# Patient Record
Sex: Female | Born: 1985 | Race: White | Hispanic: No | Marital: Single | Smoking: Current every day smoker
Health system: Southern US, Community
[De-identification: ages and names within clinical notes are randomized; demographics above are authoritative.]

## PROBLEM LIST (undated history)

## (undated) ENCOUNTER — Inpatient Hospital Stay (HOSPITAL_COMMUNITY): Payer: Self-pay

## (undated) DIAGNOSIS — F32A Depression, unspecified: Secondary | ICD-10-CM

## (undated) DIAGNOSIS — K859 Acute pancreatitis without necrosis or infection, unspecified: Secondary | ICD-10-CM

## (undated) DIAGNOSIS — O139 Gestational [pregnancy-induced] hypertension without significant proteinuria, unspecified trimester: Secondary | ICD-10-CM

## (undated) DIAGNOSIS — F419 Anxiety disorder, unspecified: Secondary | ICD-10-CM

## (undated) DIAGNOSIS — R51 Headache: Secondary | ICD-10-CM

## (undated) DIAGNOSIS — R87619 Unspecified abnormal cytological findings in specimens from cervix uteri: Secondary | ICD-10-CM

## (undated) DIAGNOSIS — O30003 Twin pregnancy, unspecified number of placenta and unspecified number of amniotic sacs, third trimester: Secondary | ICD-10-CM

## (undated) DIAGNOSIS — IMO0002 Reserved for concepts with insufficient information to code with codable children: Secondary | ICD-10-CM

## (undated) DIAGNOSIS — K851 Biliary acute pancreatitis without necrosis or infection: Secondary | ICD-10-CM

## (undated) DIAGNOSIS — F329 Major depressive disorder, single episode, unspecified: Secondary | ICD-10-CM

## (undated) DIAGNOSIS — N390 Urinary tract infection, site not specified: Secondary | ICD-10-CM

## (undated) DIAGNOSIS — D649 Anemia, unspecified: Secondary | ICD-10-CM

## (undated) DIAGNOSIS — A630 Anogenital (venereal) warts: Secondary | ICD-10-CM

## (undated) DIAGNOSIS — A749 Chlamydial infection, unspecified: Secondary | ICD-10-CM

## (undated) DIAGNOSIS — O0930 Supervision of pregnancy with insufficient antenatal care, unspecified trimester: Secondary | ICD-10-CM

## (undated) DIAGNOSIS — R87629 Unspecified abnormal cytological findings in specimens from vagina: Secondary | ICD-10-CM

## (undated) HISTORY — DX: Unspecified abnormal cytological findings in specimens from cervix uteri: R87.619

## (undated) HISTORY — DX: Chlamydial infection, unspecified: A74.9

## (undated) HISTORY — DX: Anogenital (venereal) warts: A63.0

## (undated) HISTORY — DX: Reserved for concepts with insufficient information to code with codable children: IMO0002

## (undated) HISTORY — DX: Urinary tract infection, site not specified: N39.0

---
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
## 2007-01-13 ENCOUNTER — Emergency Department (HOSPITAL_COMMUNITY): Admission: EM | Admit: 2007-01-13 | Discharge: 2007-01-13 | Payer: Self-pay | Admitting: Emergency Medicine

## 2007-04-20 ENCOUNTER — Inpatient Hospital Stay (HOSPITAL_COMMUNITY): Admission: AD | Admit: 2007-04-20 | Discharge: 2007-04-21 | Payer: Self-pay | Admitting: Obstetrics and Gynecology

## 2007-05-07 ENCOUNTER — Inpatient Hospital Stay (HOSPITAL_COMMUNITY): Admission: AD | Admit: 2007-05-07 | Discharge: 2007-05-07 | Payer: Self-pay | Admitting: Obstetrics and Gynecology

## 2007-05-26 ENCOUNTER — Inpatient Hospital Stay (HOSPITAL_COMMUNITY): Admission: AD | Admit: 2007-05-26 | Discharge: 2007-05-27 | Payer: Self-pay | Admitting: Obstetrics and Gynecology

## 2007-06-10 ENCOUNTER — Inpatient Hospital Stay (HOSPITAL_COMMUNITY): Admission: AD | Admit: 2007-06-10 | Discharge: 2007-06-12 | Payer: Self-pay | Admitting: Obstetrics and Gynecology

## 2007-10-20 ENCOUNTER — Inpatient Hospital Stay (HOSPITAL_COMMUNITY): Admission: AD | Admit: 2007-10-20 | Discharge: 2007-10-20 | Payer: Self-pay | Admitting: Obstetrics and Gynecology

## 2007-12-26 ENCOUNTER — Inpatient Hospital Stay (HOSPITAL_COMMUNITY): Admission: AD | Admit: 2007-12-26 | Discharge: 2007-12-27 | Payer: Self-pay | Admitting: Obstetrics & Gynecology

## 2009-08-01 ENCOUNTER — Emergency Department (HOSPITAL_COMMUNITY): Admission: EM | Admit: 2009-08-01 | Discharge: 2009-08-01 | Payer: Self-pay | Admitting: Emergency Medicine

## 2010-08-16 NOTE — L&D Delivery Note (Signed)
Delivery Note Pt progressed to complete and pushed very well.  At 10:09 PM a viable female was delivered via Vaginal, Spontaneous Delivery (Presentation: Left Occiput Anterior).  APGAR: 9,9 ; weight 7 lbs 12 oz .   Placenta status: spontaneous, intact, some trailing membranes removed manually .  Cord:  with the following complications: none.  Anesthesia: Epidural  Episiotomy: None Lacerations: Abrasions Suture Repair: none Est. Blood Loss (mL): 300  Mom to postpartum.  Baby to nursery-stable.  Wallis Vancott,Simao D 07/23/2011, 10:27 PM

## 2010-11-16 LAB — WET PREP, GENITAL
Clue Cells Wet Prep HPF POC: NONE SEEN
Trich, Wet Prep: NONE SEEN
Yeast Wet Prep HPF POC: NONE SEEN

## 2010-11-16 LAB — URINALYSIS, ROUTINE W REFLEX MICROSCOPIC
Bilirubin Urine: NEGATIVE
Glucose, UA: NEGATIVE mg/dL
Ketones, ur: NEGATIVE mg/dL
Nitrite: POSITIVE — AB
Protein, ur: NEGATIVE mg/dL
Specific Gravity, Urine: 1.028 (ref 1.005–1.030)
Urobilinogen, UA: 1 mg/dL (ref 0.0–1.0)
pH: 6.5 (ref 5.0–8.0)

## 2010-11-16 LAB — GC/CHLAMYDIA PROBE AMP, GENITAL
Chlamydia, DNA Probe: NEGATIVE
GC Probe Amp, Genital: NEGATIVE

## 2010-11-16 LAB — URINE MICROSCOPIC-ADD ON

## 2010-11-16 LAB — PREGNANCY, URINE: Preg Test, Ur: NEGATIVE

## 2010-11-29 ENCOUNTER — Emergency Department (HOSPITAL_COMMUNITY)
Admission: EM | Admit: 2010-11-29 | Discharge: 2010-11-29 | Disposition: A | Discharge status: Home / Self Care | Payer: Medicaid Other | Attending: Emergency Medicine | Admitting: Emergency Medicine

## 2010-11-29 ENCOUNTER — Emergency Department (HOSPITAL_COMMUNITY): Payer: Medicaid Other

## 2010-11-29 DIAGNOSIS — N390 Urinary tract infection, site not specified: Secondary | ICD-10-CM | POA: Insufficient documentation

## 2010-11-29 DIAGNOSIS — N949 Unspecified condition associated with female genital organs and menstrual cycle: Secondary | ICD-10-CM | POA: Insufficient documentation

## 2010-11-29 DIAGNOSIS — O2390 Unspecified genitourinary tract infection in pregnancy, unspecified trimester: Secondary | ICD-10-CM | POA: Insufficient documentation

## 2010-11-29 DIAGNOSIS — N898 Other specified noninflammatory disorders of vagina: Secondary | ICD-10-CM | POA: Insufficient documentation

## 2010-11-29 DIAGNOSIS — R1032 Left lower quadrant pain: Secondary | ICD-10-CM | POA: Insufficient documentation

## 2010-11-29 LAB — CBC
HCT: 36.7 % (ref 36.0–46.0)
Hemoglobin: 13.2 g/dL (ref 12.0–15.0)
MCH: 30.3 pg (ref 26.0–34.0)
MCHC: 36 g/dL (ref 30.0–36.0)
MCV: 84.4 fL (ref 78.0–100.0)
Platelets: 267 10*3/uL (ref 150–400)
RBC: 4.35 MIL/uL (ref 3.87–5.11)
RDW: 12.3 % (ref 11.5–15.5)
WBC: 10.6 10*3/uL — ABNORMAL HIGH (ref 4.0–10.5)

## 2010-11-29 LAB — DIFFERENTIAL
Basophils Absolute: 0.1 10*3/uL (ref 0.0–0.1)
Basophils Relative: 1 % (ref 0–1)
Eosinophils Absolute: 0.5 10*3/uL (ref 0.0–0.7)
Eosinophils Relative: 4 % (ref 0–5)
Lymphocytes Relative: 26 % (ref 12–46)
Lymphs Abs: 2.8 10*3/uL (ref 0.7–4.0)
Monocytes Absolute: 1 10*3/uL (ref 0.1–1.0)
Monocytes Relative: 9 % (ref 3–12)
Neutro Abs: 6.4 10*3/uL (ref 1.7–7.7)
Neutrophils Relative %: 60 % (ref 43–77)

## 2010-11-29 LAB — POCT I-STAT, CHEM 8
BUN: 6 mg/dL (ref 6–23)
Calcium, Ion: 1.09 mmol/L — ABNORMAL LOW (ref 1.12–1.32)
Chloride: 103 mEq/L (ref 96–112)
Creatinine, Ser: 0.9 mg/dL (ref 0.4–1.2)
Glucose, Bld: 88 mg/dL (ref 70–99)
HCT: 39 % (ref 36.0–46.0)
Hemoglobin: 13.3 g/dL (ref 12.0–15.0)
Potassium: 3.3 mEq/L — ABNORMAL LOW (ref 3.5–5.1)
Sodium: 136 mEq/L (ref 135–145)
TCO2: 21 mmol/L (ref 0–100)

## 2010-11-29 LAB — ABO/RH: ABO/RH(D): O POS

## 2010-11-29 LAB — URINALYSIS, ROUTINE W REFLEX MICROSCOPIC
Glucose, UA: NEGATIVE mg/dL
Hgb urine dipstick: NEGATIVE
Ketones, ur: >80 mg/dL — AB
Nitrite: NEGATIVE
Protein, ur: NEGATIVE mg/dL
Specific Gravity, Urine: 1.025 (ref 1.005–1.030)
Urobilinogen, UA: 1 mg/dL (ref 0.0–1.0)
pH: 5 (ref 5.0–8.0)

## 2010-11-29 LAB — URINE MICROSCOPIC-ADD ON

## 2010-11-29 LAB — POCT PREGNANCY, URINE: Preg Test, Ur: POSITIVE

## 2010-11-29 LAB — WET PREP, GENITAL
Clue Cells Wet Prep HPF POC: NONE SEEN
Trich, Wet Prep: NONE SEEN
Yeast Wet Prep HPF POC: NONE SEEN

## 2010-11-29 LAB — HCG, QUANTITATIVE, PREGNANCY: hCG, Beta Chain, Quant, S: 10006 m[IU]/mL — ABNORMAL HIGH (ref <5)

## 2010-12-01 LAB — URINE CULTURE: Culture  Setup Time: 201204151201
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
## 2011-03-23 LAB — ANTIBODY SCREEN: Antibody Screen: NEGATIVE

## 2011-03-23 LAB — RUBELLA ANTIBODY, IGM: Rubella: IMMUNE

## 2011-05-10 LAB — GC/CHLAMYDIA PROBE AMP, GENITAL
Chlamydia, DNA Probe: NEGATIVE
GC Probe Amp, Genital: NEGATIVE

## 2011-05-26 LAB — CBC
Hemoglobin: 10.6 — ABNORMAL LOW
Platelets: 310
RBC: 2.87 — ABNORMAL LOW
RDW: 13.3
WBC: 14.8 — ABNORMAL HIGH

## 2011-05-26 LAB — RPR: RPR Ser Ql: NONREACTIVE

## 2011-05-27 LAB — URINALYSIS, ROUTINE W REFLEX MICROSCOPIC
Glucose, UA: NEGATIVE
Specific Gravity, Urine: 1.02
pH: 7

## 2011-05-27 LAB — URINE MICROSCOPIC-ADD ON

## 2011-05-28 LAB — URINE MICROSCOPIC-ADD ON

## 2011-05-28 LAB — URINALYSIS, ROUTINE W REFLEX MICROSCOPIC
Glucose, UA: NEGATIVE
Hgb urine dipstick: NEGATIVE
Specific Gravity, Urine: <1.005 — ABNORMAL LOW
Urobilinogen, UA: 0.2

## 2011-05-28 LAB — FETAL FIBRONECTIN: Fetal Fibronectin: NEGATIVE

## 2011-06-13 ENCOUNTER — Encounter (HOSPITAL_COMMUNITY): Payer: Self-pay | Admitting: *Deleted

## 2011-06-13 ENCOUNTER — Inpatient Hospital Stay (HOSPITAL_COMMUNITY)
Admission: AD | Admit: 2011-06-13 | Discharge: 2011-06-13 | Disposition: A | Discharge status: Home / Self Care | Payer: Medicaid Other | Source: Ambulatory Visit | Attending: Obstetrics and Gynecology | Admitting: Obstetrics and Gynecology

## 2011-06-13 DIAGNOSIS — F329 Major depressive disorder, single episode, unspecified: Secondary | ICD-10-CM | POA: Diagnosis not present

## 2011-06-13 DIAGNOSIS — O479 False labor, unspecified: Secondary | ICD-10-CM

## 2011-06-13 DIAGNOSIS — J45909 Unspecified asthma, uncomplicated: Secondary | ICD-10-CM | POA: Diagnosis present

## 2011-06-13 DIAGNOSIS — F32A Depression, unspecified: Secondary | ICD-10-CM | POA: Diagnosis not present

## 2011-06-13 DIAGNOSIS — O4700 False labor before 37 completed weeks of gestation, unspecified trimester: Secondary | ICD-10-CM | POA: Insufficient documentation

## 2011-06-13 DIAGNOSIS — O09899 Supervision of other high risk pregnancies, unspecified trimester: Secondary | ICD-10-CM

## 2011-06-13 DIAGNOSIS — O0930 Supervision of pregnancy with insufficient antenatal care, unspecified trimester: Secondary | ICD-10-CM

## 2011-06-13 HISTORY — DX: Headache: R51

## 2011-06-13 HISTORY — DX: Major depressive disorder, single episode, unspecified: F32.9

## 2011-06-13 HISTORY — DX: Depression, unspecified: F32.A

## 2011-06-13 LAB — URINALYSIS, ROUTINE W REFLEX MICROSCOPIC
Bilirubin Urine: NEGATIVE
Glucose, UA: NEGATIVE mg/dL
Ketones, ur: NEGATIVE mg/dL
Protein, ur: NEGATIVE mg/dL

## 2011-06-13 LAB — URINE MICROSCOPIC-ADD ON

## 2011-06-13 LAB — WET PREP, GENITAL

## 2011-06-13 MED ORDER — TERBUTALINE SULFATE 1 MG/ML IJ SOLN
0.2500 mg | Freq: Once | INTRAMUSCULAR | Status: AC
  Administered 2011-06-13: 0.25 mg via SUBCUTANEOUS

## 2011-06-13 MED ORDER — TERBUTALINE SULFATE 1 MG/ML IJ SOLN
INTRAMUSCULAR | Status: AC
  Administered 2011-06-13: 0.25 mg via SUBCUTANEOUS
  Filled 2011-06-13: qty 1

## 2011-06-13 NOTE — Progress Notes (Signed)
Pt reports having ctx since 1pm. Reports ctx about 3-4 min now. Reports decreased fetal movement having increased mucusy discharge but denies vag bleeding.

## 2011-06-13 NOTE — ED Provider Notes (Signed)
[name] is a 25 y.o. year old G10P1011 female at [redacted]w[redacted]d weeks gestation who presents to MAU reporting preterm UC's since 85. Last IC at 1300. She reports FM and denies VB or LOF.  History OB History    Grav Para Term Preterm Abortions TAB SAB Ect Mult Living   3 1 1  0 1 0 1 0 0 1     Past Medical History  Diagnosis Date  . Asthma   . Headache     migraines  . Depression    Past Surgical History  Procedure Date  . No past surgeries    Family History: family history is not on file. Social History:  reports that she has been smoking Cigarettes.  She has been smoking about .5 packs per day. She does not have any smokeless tobacco history on file. She reports that she does not drink alcohol or use illicit drugs.  ROS: Decreased appetite today, otherwise neg   Dilation: Fingertip Station: -3 Exam by:: VKatrinka Blazing CNM  Blood pressure 119/62, pulse 96, temperature 98.6 F (37 C), temperature source Oral, resp. rate 18, height 5\' 8"  (1.727 m), weight 89.812 kg (198 lb). Maternal Exam:  Uterine Assessment: Contraction strength is mild.  Contraction duration is 50 seconds. Contraction frequency is regular.   Abdomen: Fundal height is S=D.   Fetal presentation: vertex  Introitus: Normal vulva. Vagina is positive for vaginal discharge (mucus).  Pelvis: adequate for delivery.   Cervix: Cervix evaluated by digital exam.     Fetal Exam Fetal Monitor Review: Mode: ultrasound.   Baseline rate: 140.  Variability: moderate (6-25 bpm).   Pattern: accelerations present and no decelerations.    Fetal State Assessment: Category I - tracings are normal.     Physical Exam  Constitutional: She is oriented to person, place, and time. She appears well-developed and well-nourished. She appears distressed (mild).  Cardiovascular: Normal rate.   Respiratory: Effort normal.  GI: Soft. There is no tenderness.  Genitourinary: Vaginal discharge (mucus) found.  Neurological: She is alert and  oriented to person, place, and time.  Skin: Skin is warm and dry.  Psychiatric: She has a normal mood and affect.   Dilation: Fingertip Cervical Position: Posterior Station: -3 Exam by:: Ivonne Andrew CNM   Prenatal labs: ABO, Rh: --/--/O POS (04/15 0300) Antibody:   Rubella:   RPR:    HBsAg:    HIV:    GBS:     Assessment/Plan: 1. Preterm UC's w/out cervical change, improved w/ Terbutaline 2. FHR category I  Plan: 1. D/C home per consult w/ Dr. Ambrose Mantle 2. F/U as scheduled w/ Dr. Ambrose Mantle 3. PTL precautions and FKCs 4. Pelvic rest x 1 week  [name] 06/13/2011, 7:05 PM

## 2011-07-23 ENCOUNTER — Encounter (HOSPITAL_COMMUNITY): Payer: Self-pay | Admitting: Anesthesiology

## 2011-07-23 ENCOUNTER — Encounter (HOSPITAL_COMMUNITY): Payer: Self-pay

## 2011-07-23 ENCOUNTER — Inpatient Hospital Stay (HOSPITAL_COMMUNITY): Payer: Medicaid Other | Admitting: Anesthesiology

## 2011-07-23 ENCOUNTER — Inpatient Hospital Stay (HOSPITAL_COMMUNITY)
Admission: AD | Admit: 2011-07-23 | Discharge: 2011-07-25 | DRG: 775 | Disposition: A | Discharge status: Home / Self Care | Payer: Medicaid Other | Source: Ambulatory Visit | Attending: Obstetrics and Gynecology | Admitting: Obstetrics and Gynecology

## 2011-07-23 ENCOUNTER — Encounter (HOSPITAL_COMMUNITY): Payer: Self-pay | Admitting: *Deleted

## 2011-07-23 DIAGNOSIS — O4700 False labor before 37 completed weeks of gestation, unspecified trimester: Secondary | ICD-10-CM

## 2011-07-23 DIAGNOSIS — O09899 Supervision of other high risk pregnancies, unspecified trimester: Secondary | ICD-10-CM

## 2011-07-23 DIAGNOSIS — O0930 Supervision of pregnancy with insufficient antenatal care, unspecified trimester: Secondary | ICD-10-CM

## 2011-07-23 DIAGNOSIS — O4290 Premature rupture of membranes, unspecified as to length of time between rupture and onset of labor, unspecified weeks of gestation: Principal | ICD-10-CM | POA: Diagnosis present

## 2011-07-23 HISTORY — DX: Gestational (pregnancy-induced) hypertension without significant proteinuria, unspecified trimester: O13.9

## 2011-07-23 HISTORY — DX: Anemia, unspecified: D64.9

## 2011-07-23 HISTORY — DX: Supervision of pregnancy with insufficient antenatal care, unspecified trimester: O09.30

## 2011-07-23 LAB — CBC
HCT: 31.7 % — ABNORMAL LOW (ref 36.0–46.0)
Hemoglobin: 10.5 g/dL — ABNORMAL LOW (ref 12.0–15.0)
MCHC: 33.1 g/dL (ref 30.0–36.0)
RBC: 3.68 MIL/uL — ABNORMAL LOW (ref 3.87–5.11)

## 2011-07-23 LAB — POCT FERN TEST: Fern Test: POSITIVE

## 2011-07-23 MED ORDER — LACTATED RINGERS IV SOLN
500.0000 mL | Freq: Once | INTRAVENOUS | Status: AC
  Administered 2011-07-23: 500 mL via INTRAVENOUS

## 2011-07-23 MED ORDER — PHENYLEPHRINE 40 MCG/ML (10ML) SYRINGE FOR IV PUSH (FOR BLOOD PRESSURE SUPPORT): 80.0000 ug | PREFILLED_SYRINGE | INTRAVENOUS | Status: DC | PRN

## 2011-07-23 MED ORDER — EPHEDRINE 5 MG/ML INJ: 10.0000 mg | INTRAVENOUS | Status: DC | PRN

## 2011-07-23 MED ORDER — FENTANYL CITRATE 0.05 MG/ML IJ SOLN
INTRAMUSCULAR | Status: AC
  Filled 2011-07-23: qty 2

## 2011-07-23 MED ORDER — OXYTOCIN 20 UNITS IN LACTATED RINGERS INFUSION - SIMPLE
125.0000 mL/h | Freq: Once | INTRAVENOUS | Status: AC
  Administered 2011-07-23: 125 mL/h via INTRAVENOUS

## 2011-07-23 MED ORDER — TERBUTALINE SULFATE 1 MG/ML IJ SOLN: 0.2500 mg | Freq: Once | INTRAMUSCULAR | Status: DC | PRN

## 2011-07-23 MED ORDER — FENTANYL CITRATE 0.05 MG/ML IJ SOLN
INTRAMUSCULAR | Status: DC | PRN
  Administered 2011-07-23: 100 ug via EPIDURAL

## 2011-07-23 MED ORDER — FENTANYL CITRATE 0.05 MG/ML IJ SOLN: 100.0000 ug | Freq: Once | INTRAMUSCULAR | Status: DC

## 2011-07-23 MED ORDER — EPHEDRINE 5 MG/ML INJ
10.0000 mg | INTRAVENOUS | Status: DC | PRN
  Filled 2011-07-23: qty 4

## 2011-07-23 MED ORDER — ACETAMINOPHEN 325 MG PO TABS: 650.0000 mg | ORAL_TABLET | ORAL | Status: DC | PRN

## 2011-07-23 MED ORDER — BUPIVACAINE HCL (PF) 0.25 % IJ SOLN
INTRAMUSCULAR | Status: DC | PRN
  Administered 2011-07-23 (×2): 5 mL via EPIDURAL

## 2011-07-23 MED ORDER — FENTANYL 2.5 MCG/ML BUPIVACAINE 1/10 % EPIDURAL INFUSION (WH - ANES)
14.0000 mL/h | INTRAMUSCULAR | Status: DC
  Administered 2011-07-23 (×2): 14 mL/h via EPIDURAL
  Filled 2011-07-23 (×3): qty 60

## 2011-07-23 MED ORDER — LACTATED RINGERS IV SOLN: 500.0000 mL | INTRAVENOUS | Status: DC | PRN

## 2011-07-23 MED ORDER — PHENYLEPHRINE 40 MCG/ML (10ML) SYRINGE FOR IV PUSH (FOR BLOOD PRESSURE SUPPORT)
80.0000 ug | PREFILLED_SYRINGE | INTRAVENOUS | Status: DC | PRN
  Filled 2011-07-23: qty 5

## 2011-07-23 MED ORDER — DIPHENHYDRAMINE HCL 50 MG/ML IJ SOLN: 12.5000 mg | INTRAMUSCULAR | Status: DC | PRN

## 2011-07-23 MED ORDER — LIDOCAINE HCL (PF) 1 % IJ SOLN
30.0000 mL | INTRAMUSCULAR | Status: DC | PRN
  Filled 2011-07-23: qty 30

## 2011-07-23 MED ORDER — ONDANSETRON HCL 4 MG/2ML IJ SOLN: 4.0000 mg | Freq: Four times a day (QID) | INTRAMUSCULAR | Status: DC | PRN

## 2011-07-23 MED ORDER — LACTATED RINGERS IV SOLN
INTRAVENOUS | Status: DC
  Administered 2011-07-23 (×2): 125 mL/h via INTRAVENOUS

## 2011-07-23 MED ORDER — OXYTOCIN 20 UNITS IN LACTATED RINGERS INFUSION - SIMPLE
1.0000 m[IU]/min | INTRAVENOUS | Status: DC
  Administered 2011-07-23: 1 m[IU]/min via INTRAVENOUS
  Filled 2011-07-23: qty 1000

## 2011-07-23 MED ORDER — IBUPROFEN 600 MG PO TABS: 600.0000 mg | ORAL_TABLET | Freq: Four times a day (QID) | ORAL | Status: DC | PRN

## 2011-07-23 MED ORDER — CITRIC ACID-SODIUM CITRATE 334-500 MG/5ML PO SOLN: 30.0000 mL | ORAL | Status: DC | PRN

## 2011-07-23 MED ORDER — OXYTOCIN BOLUS FROM INFUSION
500.0000 mL | Freq: Once | INTRAVENOUS | Status: DC
  Filled 2011-07-23: qty 500

## 2011-07-23 MED ORDER — FENTANYL 2.5 MCG/ML BUPIVACAINE 1/10 % EPIDURAL INFUSION (WH - ANES)
INTRAMUSCULAR | Status: DC | PRN
  Administered 2011-07-23: 14 mL/h via EPIDURAL

## 2011-07-23 MED ORDER — LIDOCAINE HCL 1.5 % IJ SOLN
INTRAMUSCULAR | Status: DC | PRN
  Administered 2011-07-23 (×2): 5 mL via EPIDURAL

## 2011-07-23 MED ORDER — OXYCODONE-ACETAMINOPHEN 5-325 MG PO TABS
1.0000 | ORAL_TABLET | ORAL | Status: DC | PRN
  Administered 2011-07-23: 1 via ORAL
  Filled 2011-07-23: qty 1

## 2011-07-23 NOTE — Anesthesia Preprocedure Evaluation (Signed)
Anesthesia Evaluation  Patient identified by MRN, date of birth, ID band Patient awake    Reviewed: Allergy & Precautions, H&P , NPO status , Patient's Chart, lab work & pertinent test results  Airway Mallampati: II TM Distance: >3 FB Neck ROM: full    Dental No notable dental hx.    Pulmonary    Pulmonary exam normal       Cardiovascular neg cardio ROS     Neuro/Psych PSYCHIATRIC DISORDERS Depression    GI/Hepatic negative GI ROS, Neg liver ROS,   Endo/Other  Negative Endocrine ROS  Renal/GU negative Renal ROS  Genitourinary negative   Musculoskeletal negative musculoskeletal ROS (+)   Abdominal Normal abdominal exam  (+)   Peds negative pediatric ROS (+)  Hematology negative hematology ROS (+)   Anesthesia Other Findings   Reproductive/Obstetrics (+) Pregnancy                           Anesthesia Physical Anesthesia Plan  ASA: II  Anesthesia Plan: Epidural   Post-op Pain Management:    Induction:   Airway Management Planned:   Additional Equipment:   Intra-op Plan:   Post-operative Plan:   Informed Consent: I have reviewed the patients History and Physical, chart, labs and discussed the procedure including the risks, benefits and alternatives for the proposed anesthesia with the patient or authorized representative who has indicated his/her understanding and acceptance.     Plan Discussed with:   Anesthesia Plan Comments:         Anesthesia Quick Evaluation

## 2011-07-23 NOTE — Progress Notes (Signed)
Patient states she started leaking clear fluid between 1100-1130 and continues to leak. Having contractions every 5 minutes, was 3 cm yesterday. Reports good fetal movement.

## 2011-07-23 NOTE — Anesthesia Procedure Notes (Signed)
Epidural Patient location during procedure: OB Start time: 07/23/2011 4:15 PM End time: 07/23/2011 4:20 PM Reason for block: procedure for pain  Staffing Anesthesiologist: Sandrea Hughs Performed by: anesthesiologist   Preanesthetic Checklist Completed: patient identified, site marked, surgical consent, pre-op evaluation, timeout performed, IV checked, risks and benefits discussed and monitors and equipment checked  Epidural Patient position: sitting Prep: site prepped and draped and DuraPrep Patient monitoring: continuous pulse ox and blood pressure Approach: midline Injection technique: LOR air  Needle:  Needle type: Tuohy  Needle gauge: 17 G Needle length: 9 cm Needle insertion depth: 5 cm cm Catheter type: closed end flexible Catheter size: 19 Gauge Catheter at skin depth: 10 cm Test dose: negative and 1.5% lidocaine  Assessment Sensory level: T8 Events: blood not aspirated, injection not painful, no injection resistance, negative IV test and no paresthesia

## 2011-07-23 NOTE — H&P (Signed)
[name] is a 25 y.o. female G3 P1011, EGA 39+ weekspresenting for evaluation of leaking fluid since mid-morning.  Evaluation in MAU confirmed ROM, VE 3 cm.  Pt admitted and started on pitocin augmentation, has received epidural.  Prenatal care essentially uncomplicated, see prenatal records for complete history.  Maternal Medical History:  Reason for admission: Reason for admission: rupture of membranes.  Fetal activity: Perceived fetal activity is normal.      OB History    Grav Para Term Preterm Abortions TAB SAB Ect Mult Living   3 1 1  0 1 0 1 0 0 1    SVD at 41 weeks, 8 lbs 6 oz SAB  Past Medical History  Diagnosis Date  . Asthma   . Headache     migraines  . Depression    Past Surgical History  Procedure Date  . No past surgeries    Family History: family history is not on file. Social History:  reports that she has been smoking Cigarettes.  She has been smoking about .5 packs per day. She does not have any smokeless tobacco history on file. She reports that she does not drink alcohol or use illicit drugs.  Review of Systems  Respiratory: Negative.   Cardiovascular: Negative.    4/50/-1, vtx, IUPC inserted Dilation: 5 Effacement (%): 60 Station: -2 Exam by:: Valentina Lucks, RN Blood pressure 101/77, pulse 66, temperature 98.1 F (36.7 C), temperature source Oral, resp. rate 18, height 5\' 6"  (1.676 m), weight 91.173 kg (201 lb), SpO2 99.00%. Maternal Exam:  Uterine Assessment: Contraction strength is firm.  Contraction frequency is regular.   Abdomen: Patient reports no abdominal tenderness. Estimated fetal weight is 8 lbs.   Fetal presentation: vertex  Introitus: Normal vulva. Normal vagina.  Ferning test: positive.  Amniotic fluid character: clear.  Pelvis: adequate for delivery.   Cervix: Cervix evaluated by digital exam.     Fetal Exam Fetal Monitor Review: Mode: ultrasound.   Baseline rate: 140.  Variability: moderate (6-25 bpm).   Pattern:  accelerations present and variable decelerations.    Fetal State Assessment: Category II - tracings are indeterminate.     Physical Exam  Constitutional: She appears well-developed and well-nourished.  Neck: Neck supple. No thyromegaly present.  Cardiovascular: Normal rate, regular rhythm and normal heart sounds.   No murmur heard. Respiratory: Effort normal and breath sounds normal. No respiratory distress.  GI: Soft.       Gravid     Prenatal labs: ABO, Rh: --/--/O POS (04/15 0300) Antibody: Negative (08/07 0000) Rubella: Immune (08/07 0000) RPR: Nonreactive (08/07 0000)  HBsAg: Negative (08/07 0000)  HIV: Non-reactive (08/07 0000)  GBS: Negative (11/06 0000)   Assessment/Plan: IUP at 39+ weeks with PROM in latent labor, on pitocin for augmentation.  Will monitor progress.   Meranda Dechaine,Sindelar D 07/23/2011, 6:07 PM

## 2011-07-24 MED ORDER — IBUPROFEN 600 MG PO TABS
600.0000 mg | ORAL_TABLET | Freq: Four times a day (QID) | ORAL | Status: DC
  Administered 2011-07-25: 600 mg via ORAL

## 2011-07-24 MED ORDER — DIPHENHYDRAMINE HCL 25 MG PO CAPS: 25.0000 mg | ORAL_CAPSULE | Freq: Four times a day (QID) | ORAL | Status: DC | PRN

## 2011-07-24 MED ORDER — WITCH HAZEL-GLYCERIN EX PADS: 1.0000 "application " | MEDICATED_PAD | CUTANEOUS | Status: DC | PRN

## 2011-07-24 MED ORDER — PRENATAL PLUS 27-1 MG PO TABS
1.0000 | ORAL_TABLET | Freq: Every day | ORAL | Status: DC
  Administered 2011-07-24 – 2011-07-25 (×2): 1 via ORAL
  Filled 2011-07-24 (×2): qty 1

## 2011-07-24 MED ORDER — DIBUCAINE 1 % RE OINT: 1.0000 "application " | TOPICAL_OINTMENT | RECTAL | Status: DC | PRN

## 2011-07-24 MED ORDER — ALBUTEROL SULFATE HFA 108 (90 BASE) MCG/ACT IN AERS
2.0000 | INHALATION_SPRAY | Freq: Four times a day (QID) | RESPIRATORY_TRACT | Status: DC | PRN
  Filled 2011-07-24: qty 6.7

## 2011-07-24 MED ORDER — MEASLES, MUMPS & RUBELLA VAC ~~LOC~~ INJ
0.5000 mL | INJECTION | Freq: Once | SUBCUTANEOUS | Status: DC
  Filled 2011-07-24: qty 0.5

## 2011-07-24 MED ORDER — TETANUS-DIPHTH-ACELL PERTUSSIS 5-2.5-18.5 LF-MCG/0.5 IM SUSP
0.5000 mL | Freq: Once | INTRAMUSCULAR | Status: AC
  Administered 2011-07-25: 0.5 mL via INTRAMUSCULAR
  Filled 2011-07-24: qty 0.5

## 2011-07-24 MED ORDER — ONDANSETRON HCL 4 MG PO TABS: 4.0000 mg | ORAL_TABLET | ORAL | Status: DC | PRN

## 2011-07-24 MED ORDER — OXYTOCIN 20 UNITS IN LACTATED RINGERS INFUSION - SIMPLE: 125.0000 mL/h | INTRAVENOUS | Status: DC | PRN

## 2011-07-24 MED ORDER — BENZOCAINE-MENTHOL 20-0.5 % EX AERO: 1.0000 "application " | INHALATION_SPRAY | CUTANEOUS | Status: DC | PRN

## 2011-07-24 MED ORDER — SIMETHICONE 80 MG PO CHEW: 80.0000 mg | CHEWABLE_TABLET | ORAL | Status: DC | PRN

## 2011-07-24 MED ORDER — LORATADINE 10 MG PO TABS
10.0000 mg | ORAL_TABLET | Freq: Every day | ORAL | Status: DC | PRN
  Filled 2011-07-24: qty 1

## 2011-07-24 MED ORDER — SENNOSIDES-DOCUSATE SODIUM 8.6-50 MG PO TABS
2.0000 | ORAL_TABLET | Freq: Every day | ORAL | Status: DC
  Administered 2011-07-24: 2 via ORAL

## 2011-07-24 MED ORDER — OXYCODONE-ACETAMINOPHEN 5-325 MG PO TABS
1.0000 | ORAL_TABLET | ORAL | Status: DC | PRN
  Administered 2011-07-24 (×5): 2 via ORAL
  Administered 2011-07-24: 1 via ORAL
  Administered 2011-07-25 (×2): 2 via ORAL
  Filled 2011-07-24 (×2): qty 2
  Filled 2011-07-24: qty 1
  Filled 2011-07-24 (×5): qty 2

## 2011-07-24 MED ORDER — METHYLERGONOVINE MALEATE 0.2 MG PO TABS: 0.2000 mg | ORAL_TABLET | ORAL | Status: DC | PRN

## 2011-07-24 MED ORDER — ZOLPIDEM TARTRATE 5 MG PO TABS: 5.0000 mg | ORAL_TABLET | Freq: Every evening | ORAL | Status: DC | PRN

## 2011-07-24 MED ORDER — MAGNESIUM HYDROXIDE 400 MG/5ML PO SUSP: 30.0000 mL | ORAL | Status: DC | PRN

## 2011-07-24 MED ORDER — ONDANSETRON HCL 4 MG/2ML IJ SOLN: 4.0000 mg | INTRAMUSCULAR | Status: DC | PRN

## 2011-07-24 MED ORDER — METHYLERGONOVINE MALEATE 0.2 MG/ML IJ SOLN: 0.2000 mg | INTRAMUSCULAR | Status: DC | PRN

## 2011-07-24 MED ORDER — LANOLIN HYDROUS EX OINT: TOPICAL_OINTMENT | CUTANEOUS | Status: DC | PRN

## 2011-07-24 NOTE — Anesthesia Postprocedure Evaluation (Signed)
  Anesthesia Post-op Note  Patient: [name]  Procedure(s) Performed: * No procedures listed *  Patient Location: Mother/Baby  Anesthesia Type: Epidural  Level of Consciousness: awake, alert  and oriented  Airway and Oxygen Therapy: Patient Spontanous Breathing  Post-op Pain: mild  Post-op Assessment: Patient's Cardiovascular Status Stable, Respiratory Function Stable, Patent Airway, No signs of Nausea or vomiting and Pain level controlled  Post-op Vital Signs: stable  Complications: No apparent anesthesia complications
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
## 2011-07-24 NOTE — Progress Notes (Signed)
PPD#1 Cramping, sore lower abdomen Afeb, VSS Fundus firm, NT at U-0 Continue routine care

## 2011-07-25 MED ORDER — OXYCODONE-ACETAMINOPHEN 5-325 MG PO TABS: 1.0000 | ORAL_TABLET | ORAL | Status: AC | PRN

## 2011-07-25 MED ORDER — IBUPROFEN 600 MG PO TABS: 600.0000 mg | ORAL_TABLET | Freq: Four times a day (QID) | ORAL | Status: AC

## 2011-07-25 NOTE — Discharge Summary (Signed)
Obstetric Discharge Summary Reason for Admission: rupture of membranes Prenatal Procedures: none Intrapartum Procedures: spontaneous vaginal delivery Postpartum Procedures: none Complications-Operative and Postpartum: none Hemoglobin  Date Value Range Status  07/23/2011 10.5* 12.0-15.0 (g/dL) Final     HCT  Date Value Range Status  07/23/2011 31.7* 36.0-46.0 (%) Final    Discharge Diagnoses: Term Pregnancy-delivered  Discharge Information: Date: 07/25/2011 Activity: pelvic rest Diet: routine Medications: Ibuprofen and Percocet Condition: stable Instructions: refer to practice specific booklet Discharge to: home Follow-up Information    Follow up with Elky Funches,Willmon D, MD. Make an appointment in 6 weeks.   Contact information:   [address], [address] [place] [address] [phone]          Newborn Data: Live born female  Birth Weight: 7 lb 12.7 oz (3535 g) APGAR: 9, 9  Home with mother.  Jenniger Figiel,Folker D 07/25/2011, 8:23 AM

## 2011-07-25 NOTE — Progress Notes (Signed)
PPD#2 Some back pin, still cramping Afeb, VSS Fundus- firm, NT at U-1 D/c home

## 2011-07-26 ENCOUNTER — Telehealth (HOSPITAL_COMMUNITY): Payer: Self-pay | Admitting: *Deleted

## 2011-07-26 NOTE — Telephone Encounter (Signed)
Preadmission screen  

## 2011-07-26 NOTE — Progress Notes (Signed)
UR chart review completed.  

## 2011-07-29 ENCOUNTER — Inpatient Hospital Stay (HOSPITAL_COMMUNITY): Admission: RE | Admit: 2011-07-29 | Payer: Medicaid Other | Source: Ambulatory Visit

## 2011-07-31 ENCOUNTER — Inpatient Hospital Stay (HOSPITAL_COMMUNITY)
Admission: AD | Admit: 2011-07-31 | Discharge: 2011-07-31 | Disposition: A | Discharge status: Home / Self Care | Payer: Medicaid Other | Source: Ambulatory Visit | Attending: Obstetrics and Gynecology | Admitting: Obstetrics and Gynecology

## 2011-07-31 ENCOUNTER — Encounter (HOSPITAL_COMMUNITY): Payer: Self-pay | Admitting: *Deleted

## 2011-07-31 DIAGNOSIS — O09899 Supervision of other high risk pregnancies, unspecified trimester: Secondary | ICD-10-CM

## 2011-07-31 DIAGNOSIS — R109 Unspecified abdominal pain: Secondary | ICD-10-CM | POA: Insufficient documentation

## 2011-07-31 DIAGNOSIS — O4700 False labor before 37 completed weeks of gestation, unspecified trimester: Secondary | ICD-10-CM

## 2011-07-31 DIAGNOSIS — O0930 Supervision of pregnancy with insufficient antenatal care, unspecified trimester: Secondary | ICD-10-CM

## 2011-07-31 DIAGNOSIS — O9081 Anemia of the puerperium: Secondary | ICD-10-CM

## 2011-07-31 LAB — CBC
Hemoglobin: 9.8 g/dL — ABNORMAL LOW (ref 12.0–15.0)
MCH: 28.9 pg (ref 26.0–34.0)
MCHC: 33 g/dL (ref 30.0–36.0)
MCV: 87.6 fL (ref 78.0–100.0)
Platelets: 316 10*3/uL (ref 150–400)

## 2011-07-31 MED ORDER — FERROUS SULFATE 325 (65 FE) MG PO TABS: 325.0000 mg | ORAL_TABLET | Freq: Three times a day (TID) | ORAL | Status: DC

## 2011-07-31 MED ORDER — HYDROCODONE-ACETAMINOPHEN 5-500 MG PO TABS: 1.0000 | ORAL_TABLET | Freq: Four times a day (QID) | ORAL | Status: AC | PRN

## 2011-07-31 NOTE — ED Provider Notes (Signed)
History     No chief complaint on file.  HPI 25 y.o. [id number] s/p SVD on 12/7. Reports bleeding has increased over last 2 days, heaviest when first getting up, then slows. Went through 2 pads in about 3 hours this morning, now bleeding has decreased. C/O cramping.     Past Medical History  Diagnosis Date  . Asthma   . Headache     migraines  . Depression   . PIH (pregnancy induced hypertension)   . Late prenatal care     22wks  . Anemia 2007    Past Surgical History  Procedure Date  . No past surgeries     Family History  Problem Relation Age of Onset  . Anesthesia problems Neg Hx   . Hypotension Neg Hx   . Malignant hyperthermia Neg Hx   . Pseudochol deficiency Neg Hx     History  Substance Use Topics  . Smoking status: Current Everyday Smoker -- 0.5 packs/day    Types: Cigarettes  . Smokeless tobacco: Not on file  . Alcohol Use: No    Allergies: No Known Allergies  Prescriptions prior to admission  Medication Sig Dispense Refill  . diphenhydrAMINE (BENADRYL) 25 mg capsule Take 25 mg by mouth 2 (two) times daily as needed. allergies       . fluocinonide (LIDEX) 0.05 % cream Apply 1 application topically 2 (two) times daily as needed. For eczema       . HYDROcodone-acetaminophen (VICODIN) 5-500 MG per tablet Take 1.5 tablets by mouth every 6 (six) hours as needed. pain       . ibuprofen (ADVIL,MOTRIN) 600 MG tablet Take 1 tablet (600 mg total) by mouth every 6 (six) hours.  30 tablet  1  . loratadine (CLARITIN) 10 MG tablet Take 10 mg by mouth daily as needed. For allergies       . oxyCODONE-acetaminophen (PERCOCET) 5-325 MG per tablet Take 1-2 tablets by mouth every 3 (three) hours as needed (moderate - severe pain).  20 tablet  0  . prenatal vitamin w/FE, FA (PRENATAL 1 + 1) 27-1 MG TABS Take 1 tablet by mouth daily.        . ranitidine (ZANTAC) 150 MG capsule Take 150 mg by mouth 2 (two) times daily.        Marland Kitchen albuterol (PROVENTIL HFA;VENTOLIN HFA) 108 (90  BASE) MCG/ACT inhaler Inhale 2 puffs into the lungs every 6 (six) hours as needed. For exercise-induced shortness of breath         Review of Systems  Constitutional: Negative.   Respiratory: Negative.   Cardiovascular: Negative.   Gastrointestinal: Positive for abdominal pain. Negative for nausea, vomiting, diarrhea and constipation.  Genitourinary: Negative for dysuria, urgency, frequency, hematuria and flank pain.       Positive for vaginal bleeding   Musculoskeletal: Negative.   Neurological: Negative.   Psychiatric/Behavioral: Negative.    Physical Exam   Blood pressure 124/71, pulse 43, temperature 98.6 F (37 C), temperature source Oral, resp. rate 18, height 5\' 8"  (1.727 m), weight 189 lb 6.4 oz (85.911 kg), currently breastfeeding.  Physical Exam  Nursing note and vitals reviewed. Constitutional: She is oriented to person, place, and time. She appears well-developed and well-nourished. No distress.  Cardiovascular: Normal rate and regular rhythm.   Respiratory: No respiratory distress.  GI: Soft. There is no tenderness.       Fundus firm   Genitourinary:       Bleeding light   Musculoskeletal: Normal range  of motion.  Neurological: She is alert and oriented to person, place, and time.  Skin: Skin is warm and dry.  Psychiatric: She has a normal mood and affect.    MAU Course  Procedures  Results for orders placed during the hospital encounter of 07/31/11 (from the past 48 hour(s))  CBC     Status: Abnormal   Collection Time   07/31/11  4:50 PM      Component Value Range Comment   WBC 8.2  4.0 - 10.5 (K/uL)    RBC 3.39 (*) 3.87 - 5.11 (MIL/uL)    Hemoglobin 9.8 (*) 12.0 - 15.0 (g/dL)    HCT 16.1 (*) 09.6 - 46.0 (%)    MCV 87.6  78.0 - 100.0 (fL)    MCH 28.9  26.0 - 34.0 (pg)    MCHC 33.0  30.0 - 36.0 (g/dL)    RDW 04.5  40.9 - 81.1 (%)    Platelets 316  150 - 400 (K/uL)      Assessment and Plan  25 y.o. [id number], 1 week postpartum, normal lochia, HGB  stable  Medication List  As of 08/02/2011 12:53 AM   START taking these medications         ferrous sulfate 325 (65 FE) MG tablet   Take 1 tablet (325 mg total) by mouth 3 (three) times daily with meals.      * HYDROcodone-acetaminophen 5-500 MG per tablet   Commonly known as: VICODIN   Take 1 tablet by mouth every 6 (six) hours as needed for pain.     * Notice: This list has 1 medication(s) that are the same as other medications prescribed for you. Read the directions carefully, and ask your doctor or other care provider to review them with you.       CONTINUE taking these medications         albuterol 108 (90 BASE) MCG/ACT inhaler   Commonly known as: PROVENTIL HFA;VENTOLIN HFA      diphenhydrAMINE 25 mg capsule   Commonly known as: BENADRYL      fluocinonide cream 0.05 %   Commonly known as: LIDEX      * HYDROcodone-acetaminophen 5-500 MG per tablet   Commonly known as: VICODIN      ibuprofen 600 MG tablet   Commonly known as: ADVIL,MOTRIN   Take 1 tablet (600 mg total) by mouth every 6 (six) hours.      loratadine 10 MG tablet   Commonly known as: CLARITIN      oxyCODONE-acetaminophen 5-325 MG per tablet   Commonly known as: PERCOCET   Take 1-2 tablets by mouth every 3 (three) hours as needed (moderate - severe pain).      prenatal vitamin w/FE, FA 27-1 MG Tabs      ranitidine 150 MG capsule   Commonly known as: ZANTAC     * Notice: This list has 1 medication(s) that are the same as other medications prescribed for you. Read the directions carefully, and ask your doctor or other care provider to review them with you.        Where to get your medications    These are the prescriptions that you need to pick up. We sent them to a specific pharmacy, so you will need to go there to get them.   [id number] - [place], [place] - [address] AT [facility] [address]    [address] [place] [address]    Phone:  [phone]         ferrous sulfate 325 (65 FE) MG tablet         You may get these medications from any pharmacy.         HYDROcodone-acetaminophen 5-500 MG per tablet             FRAZIER,NATALIE 07/31/2011, 5:53 PM

## 2011-07-31 NOTE — Progress Notes (Signed)
Pt reports vaginal bleeding has incrased over the las 2 days. Vag Delivery on 12/7. Pt reports more cramping as well.

## 2011-08-14 ENCOUNTER — Emergency Department: Payer: Self-pay | Admitting: *Deleted

## 2011-10-15 ENCOUNTER — Emergency Department (HOSPITAL_COMMUNITY)
Admission: EM | Admit: 2011-10-15 | Discharge: 2011-10-15 | Disposition: A | Discharge status: Home Health | Payer: Self-pay | Attending: Emergency Medicine | Admitting: Emergency Medicine

## 2011-10-15 ENCOUNTER — Encounter (HOSPITAL_COMMUNITY): Payer: Self-pay | Admitting: *Deleted

## 2011-10-15 ENCOUNTER — Emergency Department (HOSPITAL_COMMUNITY): Payer: Self-pay

## 2011-10-15 DIAGNOSIS — R059 Cough, unspecified: Secondary | ICD-10-CM | POA: Insufficient documentation

## 2011-10-15 DIAGNOSIS — J3489 Other specified disorders of nose and nasal sinuses: Secondary | ICD-10-CM | POA: Insufficient documentation

## 2011-10-15 DIAGNOSIS — B9789 Other viral agents as the cause of diseases classified elsewhere: Secondary | ICD-10-CM | POA: Insufficient documentation

## 2011-10-15 DIAGNOSIS — R05 Cough: Secondary | ICD-10-CM | POA: Insufficient documentation

## 2011-10-15 DIAGNOSIS — R509 Fever, unspecified: Secondary | ICD-10-CM | POA: Insufficient documentation

## 2011-10-15 DIAGNOSIS — R5381 Other malaise: Secondary | ICD-10-CM | POA: Insufficient documentation

## 2011-10-15 DIAGNOSIS — R112 Nausea with vomiting, unspecified: Secondary | ICD-10-CM | POA: Insufficient documentation

## 2011-10-15 DIAGNOSIS — R070 Pain in throat: Secondary | ICD-10-CM | POA: Insufficient documentation

## 2011-10-15 DIAGNOSIS — M545 Low back pain, unspecified: Secondary | ICD-10-CM | POA: Insufficient documentation

## 2011-10-15 DIAGNOSIS — R079 Chest pain, unspecified: Secondary | ICD-10-CM | POA: Insufficient documentation

## 2011-10-15 DIAGNOSIS — B349 Viral infection, unspecified: Secondary | ICD-10-CM

## 2011-10-15 DIAGNOSIS — IMO0001 Reserved for inherently not codable concepts without codable children: Secondary | ICD-10-CM | POA: Insufficient documentation

## 2011-10-15 DIAGNOSIS — F3289 Other specified depressive episodes: Secondary | ICD-10-CM | POA: Insufficient documentation

## 2011-10-15 DIAGNOSIS — J45909 Unspecified asthma, uncomplicated: Secondary | ICD-10-CM | POA: Insufficient documentation

## 2011-10-15 DIAGNOSIS — F329 Major depressive disorder, single episode, unspecified: Secondary | ICD-10-CM | POA: Insufficient documentation

## 2011-10-15 DIAGNOSIS — Z79899 Other long term (current) drug therapy: Secondary | ICD-10-CM | POA: Insufficient documentation

## 2011-10-15 DIAGNOSIS — M2550 Pain in unspecified joint: Secondary | ICD-10-CM | POA: Insufficient documentation

## 2011-10-15 LAB — URINALYSIS, ROUTINE W REFLEX MICROSCOPIC
Nitrite: NEGATIVE
Protein, ur: NEGATIVE mg/dL
Specific Gravity, Urine: 1.025 (ref 1.005–1.030)
Urobilinogen, UA: 1 mg/dL (ref 0.0–1.0)

## 2011-10-15 LAB — URINE MICROSCOPIC-ADD ON

## 2011-10-15 LAB — PREGNANCY, URINE: Preg Test, Ur: NEGATIVE

## 2011-10-15 MED ORDER — BENZONATATE 100 MG PO CAPS: 100.0000 mg | ORAL_CAPSULE | Freq: Three times a day (TID) | ORAL | Status: AC

## 2011-10-15 MED ORDER — ONDANSETRON HCL 4 MG PO TABS: 4.0000 mg | ORAL_TABLET | Freq: Four times a day (QID) | ORAL | Status: AC

## 2011-10-15 NOTE — ED Notes (Signed)
Pt here c/o nausea and aching all over.  Pt talking on phone in no acute distress at this time.

## 2011-10-15 NOTE — ED Provider Notes (Signed)
History     CSN: [id number]  Arrival date & time 10/15/11  1331   First MD Initiated Contact with Patient 10/15/11 1536      Chief Complaint  Patient presents with  . Bronchitis  . Generalized Body Aches    (Consider location/radiation/quality/duration/timing/severity/associated sxs/prior treatment) HPI Comments: Patient presents with "bronchitis" that she's been having for the past 3 weeks associated with generalized body aches, chills, fevers, coughing. Origin episode of nausea and vomiting. No abdominal pain. Some anterior chest pain with coughing, no shortness of breath. She was seen by her PCP Dr. Bascom Levels and treated with unknown antibiotic that she finished last week. She reports not getting any, better taking Mucinex at home. Has had some low back pain for the past couple days. No dysuria hematuria.  The history is provided by the patient.    Past Medical History  Diagnosis Date  . Asthma   . Headache     migraines  . Depression   . PIH (pregnancy induced hypertension)   . Late prenatal care     22wks  . Anemia 2007    Past Surgical History  Procedure Date  . No past surgeries     Family History  Problem Relation Age of Onset  . Anesthesia problems Neg Hx   . Hypotension Neg Hx   . Malignant hyperthermia Neg Hx   . Pseudochol deficiency Neg Hx     History  Substance Use Topics  . Smoking status: Current Everyday Smoker -- 0.5 packs/day    Types: Cigarettes  . Smokeless tobacco: Not on file  . Alcohol Use: No    OB History    Grav Para Term Preterm Abortions TAB SAB Ect Mult Living   3 2 2  0 1 0 1 0 0 2      Review of Systems  Constitutional: Positive for chills, activity change, appetite change and fatigue. Negative for fever.  HENT: Positive for congestion, sore throat and rhinorrhea.   Respiratory: Positive for cough. Negative for chest tightness and shortness of breath.   Cardiovascular: Negative for chest pain.  Gastrointestinal: Positive for  nausea and vomiting. Negative for abdominal pain.  Genitourinary: Negative for dysuria and hematuria.  Musculoskeletal: Positive for myalgias, back pain and arthralgias.  Neurological: Positive for weakness. Negative for light-headedness and headaches.    Allergies  Corn syrup  Home Medications   Current Outpatient Rx  Name Route Sig Dispense Refill  . ALBUTEROL SULFATE HFA 108 (90 BASE) MCG/ACT IN AERS Inhalation Inhale 2 puffs into the lungs every 6 (six) hours as needed. For exercise-induced shortness of breath     . DIPHENHYDRAMINE HCL 25 MG PO CAPS Oral Take 25 mg by mouth 2 (two) times daily as needed. allergies     . FERROUS SULFATE 325 (65 FE) MG PO TABS Oral Take 1 tablet (325 mg total) by mouth 3 (three) times daily with meals. 90 tablet 11  . FLUOCINONIDE 0.05 % EX CREA Topical Apply 1 application topically 2 (two) times daily as needed. For eczema     . LORATADINE 10 MG PO TABS Oral Take 10 mg by mouth daily as needed. For allergies     . PRENATAL PLUS 27-1 MG PO TABS Oral Take 1 tablet by mouth daily.      Marland Kitchen RANITIDINE HCL 150 MG PO CAPS Oral Take 150 mg by mouth 2 (two) times daily.        BP 117/71  Pulse 91  Temp(Src) 98.3 F (36.8  C) (Oral)  Resp 18  SpO2 100%  LMP 09/27/2011  Breastfeeding? No  Physical Exam  Constitutional: She is oriented to person, place, and time. She appears well-developed and well-nourished. No distress.  HENT:  Head: Normocephalic and atraumatic.  Mouth/Throat: Oropharynx is clear and moist. No oropharyngeal exudate.       Moist cough  Eyes: Conjunctivae are normal. Pupils are equal, round, and reactive to light.  Neck: Normal range of motion.       No meningismus  Cardiovascular: Normal rate, regular rhythm and normal heart sounds.   No murmur heard. Pulmonary/Chest: Effort normal and breath sounds normal. No respiratory distress.  Abdominal: Soft. There is no tenderness. There is no rebound and no guarding.  Musculoskeletal:  Normal range of motion. She exhibits tenderness. She exhibits no edema.       Paraspinal lumbar pain, no CVA tenderness  Neurological: She is alert and oriented to person, place, and time. No cranial nerve deficit.  Skin: Skin is warm.    ED Course  Procedures (including critical care time)  Labs Reviewed  URINALYSIS, ROUTINE W REFLEX MICROSCOPIC - Abnormal; Notable for the following:    Ketones, ur 15 (*)    Leukocytes, UA SMALL (*)    All other components within normal limits  URINE MICROSCOPIC-ADD ON - Abnormal; Notable for the following:    Squamous Epithelial / LPF MANY (*)    All other components within normal limits  PREGNANCY, URINE   Dg Chest 2 View  10/15/2011  *RADIOLOGY REPORT*  Clinical Data: Cough, congestion, chest pressure, epigastric pain, vomiting for 2 weeks, history asthma  CHEST - 2 VIEW  Comparison: 02/21/2006  Findings: Normal heart size, mediastinal contours, and pulmonary vascularity. Mild chronic bronchial thickening. Lungs hyperinflated but otherwise clear. No pleural effusion or pneumothorax. No acute bony abnormalities.  IMPRESSION: Hyperinflated lungs with chronic peribronchial thickening, question related to asthma. No acute infiltrate.  Original Report Authenticated By: Lollie Marrow, M.D.     No diagnosis found.    MDM  Prolonged illness of cough, congestion, nausea, generalized body aches and chills. Completed a course of antibiotics. Vitals stable, no distress, nontoxic appearance.  Patient's family member states antibiotic is Levaquin. Chest x-ray negative, UA negative. No further antibiotics indicated. Followup with Dr. Bascom Levels.       Glynn Octave, MD 10/15/11 [phone]

## 2011-10-15 NOTE — ED Notes (Signed)
Pt reports bronchitis approx  3 weeks. Woke up this am with nausea and generalized body aches. Pt reports she has been taking mucinex at home.

## 2011-10-15 NOTE — Discharge Instructions (Signed)
Viral Infections A viral infection can be caused by different types of viruses.Most viral infections are not serious and resolve on their own. However, some infections may cause severe symptoms and may lead to further complications. SYMPTOMS Viruses can frequently cause:  Minor sore throat.   Aches and pains.   Headaches.   Runny nose.   Different types of rashes.   Watery eyes.   Tiredness.   Cough.   Loss of appetite.   Gastrointestinal infections, resulting in nausea, vomiting, and diarrhea.  These symptoms do not respond to antibiotics because the infection is not caused by bacteria. However, you might catch a bacterial infection following the viral infection. This is sometimes called a "superinfection." Symptoms of such a bacterial infection may include:  Worsening sore throat with pus and difficulty swallowing.   Swollen neck glands.   Chills and a high or persistent fever.   Severe headache.   Tenderness over the sinuses.   Persistent overall ill feeling (malaise), muscle aches, and tiredness (fatigue).   Persistent cough.   Yellow, green, or brown mucus production with coughing.  HOME CARE INSTRUCTIONS   Only take over-the-counter or prescription medicines for pain, discomfort, diarrhea, or fever as directed by your caregiver.   Drink enough water and fluids to keep your urine clear or pale yellow. Sports drinks can provide valuable electrolytes, sugars, and hydration.   Get plenty of rest and maintain proper nutrition. Soups and broths with crackers or rice are fine.  SEEK IMMEDIATE MEDICAL CARE IF:   You have severe headaches, shortness of breath, chest pain, neck pain, or an unusual rash.   You have uncontrolled vomiting, diarrhea, or you are unable to keep down fluids.   You or your child has an oral temperature above 102 F (38.9 C), not controlled by medicine.   Your baby is older than 3 months with a rectal temperature of 102 F (38.9 C) or  higher.   Your baby is 3 months old or younger with a rectal temperature of 100.4 F (38 C) or higher.  MAKE SURE YOU:   Understand these instructions.   Will watch your condition.   Will get help right away if you are not doing well or get worse.  Document Released: 05/12/2005 Document Revised: 04/14/2011 Document Reviewed: 12/07/2010 ExitCare Patient Information 2012 [employer]. 

## 2011-12-29 ENCOUNTER — Encounter (HOSPITAL_COMMUNITY): Payer: Self-pay | Admitting: Emergency Medicine

## 2011-12-29 ENCOUNTER — Emergency Department (INDEPENDENT_AMBULATORY_CARE_PROVIDER_SITE_OTHER)
Admission: EM | Admit: 2011-12-29 | Discharge: 2011-12-29 | Disposition: A | Discharge status: Home / Self Care | Payer: Self-pay | Source: Home / Self Care | Attending: Emergency Medicine | Admitting: Emergency Medicine

## 2011-12-29 DIAGNOSIS — L089 Local infection of the skin and subcutaneous tissue, unspecified: Secondary | ICD-10-CM

## 2011-12-29 MED ORDER — SULFAMETHOXAZOLE-TRIMETHOPRIM 800-160 MG PO TABS: 1.0000 | ORAL_TABLET | Freq: Two times a day (BID) | ORAL | Status: DC

## 2011-12-29 MED ORDER — HYDROCODONE-ACETAMINOPHEN 5-325 MG PO TABS: 2.0000 | ORAL_TABLET | ORAL | Status: DC | PRN

## 2011-12-29 NOTE — Discharge Instructions (Signed)
Skin Infections A skin infection usually develops as a result of disruption of the skin barrier.  CAUSES  A skin infection might occur following:  Trauma or an injury to the skin such as a cut or insect sting.   Inflammation (as in eczema).   Breaks in the skin between the toes (as in athlete's foot).   Swelling (edema).  SYMPTOMS  The legs are the most common site affected. Usually there is:  Redness.   Swelling.   Pain.   There may be red streaks in the area of the infection.  TREATMENT   Minor skin infections may be treated with topical antibiotics, but if the skin infection is severe, hospital care and intravenous (IV) antibiotic treatment may be needed.   Most often skin infections can be treated with oral antibiotic medicine as well as proper rest and elevation of the affected area until the infection improves.   If you are prescribed oral antibiotics, it is important to take them as directed and to take all the pills even if you feel better before you have finished all of the medicine.   You may apply warm compresses to the area for 20-30 minutes 4 times daily.  You might need a tetanus shot now if:  You have no idea when you had the last one.   You have never had a tetanus shot before.   Your wound had dirt in it.  If you need a tetanus shot and you decide not to get one, there is a rare chance of getting tetanus. Sickness from tetanus can be serious. If you get a tetanus shot, your arm may swell and become red and warm at the shot site. This is common and not a problem. SEEK MEDICAL CARE IF:  The pain and swelling from your infection do not improve within 2 days.  SEEK IMMEDIATE MEDICAL CARE IF:  You develop a fever, chills, or other serious problems.  Document Released: 09/09/2004 Document Revised: 07/22/2011 Document Reviewed: 07/22/2008 ExitCare Patient Information 2012 [employer]. 

## 2011-12-29 NOTE — ED Provider Notes (Signed)
History     CSN: [id number]  Arrival date & time 12/29/11  1305   First MD Initiated Contact with Patient 12/29/11 1426      Chief Complaint  Patient presents with  . Abscess    (Consider location/radiation/quality/duration/timing/severity/associated sxs/prior treatment) Patient is a 26 y.o. female presenting with rash. The history is provided by the patient. No language interpreter was used.  Rash  This is a new problem. The current episode started 2 days ago. The problem has been gradually worsening. The problem is associated with nothing. There has been no fever. The rash is present on the genitalia. The pain is at a severity of 5/10. The pain is moderate. The pain has been worsening since onset. Associated symptoms include itching and pain. She has tried nothing for the symptoms.  Pt complains of a swollen area right pubic area  Past Medical History  Diagnosis Date  . Asthma   . Headache     migraines  . Depression   . PIH (pregnancy induced hypertension)   . Late prenatal care     22wks  . Anemia 2007    Past Surgical History  Procedure Date  . No past surgeries     Family History  Problem Relation Age of Onset  . Anesthesia problems Neg Hx   . Hypotension Neg Hx   . Malignant hyperthermia Neg Hx   . Pseudochol deficiency Neg Hx     History  Substance Use Topics  . Smoking status: Current Everyday Smoker -- 0.5 packs/day    Types: Cigarettes  . Smokeless tobacco: Not on file  . Alcohol Use: No    OB History    Grav Para Term Preterm Abortions TAB SAB Ect Mult Living   3 2 2  0 1 0 1 0 0 2      Review of Systems  Skin: Positive for itching, rash and wound.  All other systems reviewed and are negative.    Allergies  Corn syrup  Home Medications   Current Outpatient Rx  Name Route Sig Dispense Refill  . ACETAMINOPHEN 325 MG PO TABS Oral Take 650 mg by mouth every 6 (six) hours as needed.    . ALBUTEROL SULFATE HFA 108 (90 BASE) MCG/ACT IN AERS  Inhalation Inhale 2 puffs into the lungs every 6 (six) hours as needed. For exercise-induced shortness of breath     . DIPHENHYDRAMINE HCL 25 MG PO CAPS Oral Take 25 mg by mouth 2 (two) times daily as needed. allergies     . FERROUS SULFATE 325 (65 FE) MG PO TABS Oral Take 1 tablet (325 mg total) by mouth 3 (three) times daily with meals. 90 tablet 11  . FLUOCINONIDE 0.05 % EX CREA Topical Apply 1 application topically 2 (two) times daily as needed. For eczema     . HYDROCODONE-ACETAMINOPHEN 5-325 MG PO TABS Oral Take 2 tablets by mouth every 4 (four) hours as needed for pain. 10 tablet 0  . LORATADINE 10 MG PO TABS Oral Take 10 mg by mouth daily as needed. For allergies     . PRENATAL PLUS 27-1 MG PO TABS Oral Take 1 tablet by mouth daily.      Marland Kitchen RANITIDINE HCL 150 MG PO CAPS Oral Take 150 mg by mouth 2 (two) times daily.      . SULFAMETHOXAZOLE-TRIMETHOPRIM 800-160 MG PO TABS Oral Take 1 tablet by mouth every 12 (twelve) hours. 14 tablet 0    BP 111/54  Pulse 78  Temp(Src)  98.3 F (36.8 C) (Oral)  Resp 18  SpO2 100%  LMP 12/22/2011  Physical Exam  Nursing note and vitals reviewed. Constitutional: She is oriented to person, place, and time. She appears well-developed and well-nourished.  HENT:  Head: Normocephalic and atraumatic.  Right Ear: External ear normal.  Left Ear: External ear normal.  Eyes: EOM are normal. Pupils are equal, round, and reactive to light.  Neck: Normal range of motion. Neck supple.  Cardiovascular: Normal rate and normal heart sounds.   Pulmonary/Chest: Effort normal and breath sounds normal.  Abdominal: Soft.  Genitourinary:       Swollen red area right groin, suprapubic area  Musculoskeletal: Normal range of motion.  Neurological: She is alert and oriented to person, place, and time.  Skin: Skin is warm.  Psychiatric: She has a normal mood and affect.    ED Course  Procedures (including critical care time)  Labs Reviewed - No data to display No  results found.   1. Skin infection       MDM  Pt advised soak area, bactrim and hydrocodone for pain.   Pt advised to return in 2 days for a recheck.       Lonia Skinner [place], [place] 12/29/11 [address] [place], [place] 01/12/12 [phone]

## 2011-12-29 NOTE — ED Notes (Signed)
Abscess located right pubic area, reported as painful and making walking painful and difficult

## 2011-12-29 NOTE — ED Notes (Signed)
Patient instructed to put on gown for physician exam 

## 2011-12-31 ENCOUNTER — Emergency Department (HOSPITAL_COMMUNITY)
Admission: EM | Admit: 2011-12-31 | Discharge: 2011-12-31 | Disposition: A | Discharge status: Home / Self Care | Payer: Self-pay | Attending: Emergency Medicine | Admitting: Emergency Medicine

## 2011-12-31 ENCOUNTER — Encounter (HOSPITAL_COMMUNITY): Payer: Self-pay

## 2011-12-31 ENCOUNTER — Emergency Department (HOSPITAL_COMMUNITY)
Admission: EM | Admit: 2011-12-31 | Discharge: 2011-12-31 | Disposition: A | Discharge status: Home / Self Care | Payer: Self-pay | Source: Home / Self Care

## 2011-12-31 DIAGNOSIS — J45909 Unspecified asthma, uncomplicated: Secondary | ICD-10-CM | POA: Insufficient documentation

## 2011-12-31 DIAGNOSIS — L03319 Cellulitis of trunk, unspecified: Secondary | ICD-10-CM | POA: Insufficient documentation

## 2011-12-31 DIAGNOSIS — F17200 Nicotine dependence, unspecified, uncomplicated: Secondary | ICD-10-CM | POA: Insufficient documentation

## 2011-12-31 DIAGNOSIS — F329 Major depressive disorder, single episode, unspecified: Secondary | ICD-10-CM | POA: Insufficient documentation

## 2011-12-31 DIAGNOSIS — L02214 Cutaneous abscess of groin: Secondary | ICD-10-CM

## 2011-12-31 DIAGNOSIS — L02219 Cutaneous abscess of trunk, unspecified: Secondary | ICD-10-CM | POA: Insufficient documentation

## 2011-12-31 DIAGNOSIS — F3289 Other specified depressive episodes: Secondary | ICD-10-CM | POA: Insufficient documentation

## 2011-12-31 DIAGNOSIS — R609 Edema, unspecified: Secondary | ICD-10-CM | POA: Insufficient documentation

## 2011-12-31 MED ORDER — HYDROCODONE-ACETAMINOPHEN 5-325 MG PO TABS: 1.0000 | ORAL_TABLET | ORAL | Status: DC | PRN

## 2011-12-31 NOTE — ED Provider Notes (Signed)
Medical screening examination/treatment/procedure(s) were performed by non-physician practitioner and as supervising physician I was immediately available for consultation/collaboration.   Celene Kras, MD 12/31/11 [phone]

## 2011-12-31 NOTE — ED Notes (Signed)
Pa in to i & d pt abcess

## 2011-12-31 NOTE — ED Notes (Signed)
Abscess to her rt. Pelvic area large, red and a dark center.

## 2011-12-31 NOTE — Discharge Instructions (Signed)
Abscess  Care After  An abscess (also called a boil or furuncle) is an infected area that contains a collection of pus. Signs and symptoms of an abscess include pain, tenderness, redness, or hardness, or you may feel a moveable soft area under your skin. An abscess can occur anywhere in the body. The infection may spread to surrounding tissues causing cellulitis. A cut (incision) by the surgeon was made over your abscess and the pus was drained out. Gauze may have been packed into the space to provide a drain that will allow the cavity to heal from the inside outwards. The boil may be painful for 5 to 7 days. Most people with a boil do not have high fevers. Your abscess, if seen early, may not have localized, and may not have been lanced. If not, another appointment may be required for this if it does not get better on its own or with medications.  HOME CARE INSTRUCTIONS     Only take over-the-counter or prescription medicines for pain, discomfort, or fever as directed by your caregiver.    When you bathe, soak and then remove gauze or iodoform packs at least daily or as directed by your caregiver. You may then wash the wound gently with mild soapy water. Repack with gauze or do as your caregiver directs.   SEEK IMMEDIATE MEDICAL CARE IF:     You develop increased pain, swelling, redness, drainage, or bleeding in the wound site.    You develop signs of generalized infection including muscle aches, chills, fever, or a general ill feeling.    An oral temperature above 102 F (38.9 C) develops, not controlled by medication.   See your caregiver for a recheck if you develop any of the symptoms described above. If medications (antibiotics) were prescribed, take them as directed.  Document Released: 02/18/2005 Document Revised: 07/22/2011 Document Reviewed: 10/16/2007  [employer] Patient Information 2012 [employer].

## 2011-12-31 NOTE — ED Notes (Signed)
Pa has finished i & d. Pt tolerated well. Wound cleaned and dressed by emt

## 2011-12-31 NOTE — ED Provider Notes (Signed)
History     CSN: [id number]  Arrival date & time 12/31/11  4098   First MD Initiated Contact with Patient 12/31/11 0930      Chief Complaint  Patient presents with  . Abscess    (Consider location/radiation/quality/duration/timing/severity/associated sxs/prior treatment) Patient is a 26 y.o. female presenting with abscess. The history is provided by the patient.  Abscess  This is a new problem. The current episode started less than one week ago. The onset was gradual. The problem occurs continuously. The problem has been gradually worsening. The abscess is present on the groin. The problem is severe. The abscess is characterized by redness, swelling and painfulness. It is unknown what she was exposed to. Pertinent negatives include no fever and no vomiting. Recent Medical Care: At [facility]. Services Performed: abx given, advised 2 day recheck.    Past Medical History  Diagnosis Date  . Asthma   . Headache     migraines  . Depression   . PIH (pregnancy induced hypertension)   . Late prenatal care     22wks  . Anemia 2007    Past Surgical History  Procedure Date  . No past surgeries     Family History  Problem Relation Age of Onset  . Anesthesia problems Neg Hx   . Hypotension Neg Hx   . Malignant hyperthermia Neg Hx   . Pseudochol deficiency Neg Hx     History  Substance Use Topics  . Smoking status: Current Everyday Smoker -- 0.5 packs/day    Types: Cigarettes  . Smokeless tobacco: Not on file  . Alcohol Use: No    OB History    Grav Para Term Preterm Abortions TAB SAB Ect Mult Living   3 2 2  0 1 0 1 0 0 2      Review of Systems  Constitutional: Negative for fever.  Gastrointestinal: Negative for vomiting.  Skin: Positive for color change and wound.    Allergies  Corn syrup  Home Medications   Current Outpatient Rx  Name Route Sig Dispense Refill  . ALBUTEROL SULFATE HFA 108 (90 BASE) MCG/ACT IN AERS Inhalation Inhale 2 puffs into the lungs every 6  (six) hours as needed. For exercise-induced shortness of breath     . DIPHENHYDRAMINE HCL 25 MG PO CAPS Oral Take 25 mg by mouth 2 (two) times daily as needed. allergies     . FERROUS SULFATE 325 (65 FE) MG PO TABS Oral Take 1 tablet (325 mg total) by mouth 3 (three) times daily with meals. 90 tablet 11  . FLUOCINONIDE 0.05 % EX CREA Topical Apply 1 application topically 2 (two) times daily as needed. For eczema     . HYDROCODONE-ACETAMINOPHEN 5-325 MG PO TABS Oral Take 2 tablets by mouth every 4 (four) hours as needed for pain. 10 tablet 0  . RANITIDINE HCL 150 MG PO CAPS Oral Take 150 mg by mouth 2 (two) times daily.      . SULFAMETHOXAZOLE-TMP DS 800-160 MG PO TABS Oral Take 1 tablet by mouth 2 (two) times daily. For 7 days Started 5/15      BP 110/63  Pulse 72  Temp(Src) 97.5 F (36.4 C) (Oral)  Resp 18  SpO2 100%  LMP 12/22/2011  Physical Exam  Nursing note reviewed. Constitutional: She appears well-developed and well-nourished.       Vital signs are reviewed and are normal. Uncomfortable appearing.   HENT:  Head: Normocephalic.  Neck: Neck supple.  Cardiovascular: Normal rate and regular  rhythm.   Pulmonary/Chest: Effort normal. No respiratory distress.  Abdominal: Soft. She exhibits no distension.  Musculoskeletal: She exhibits no edema.  Neurological: She is alert.  Skin: Skin is warm and dry.       2cm area of erythema with central fluctuance and small area of surrounding induration to right area of mons pubis. No active drainage. Exquisitely TTP.  Psychiatric: She has a normal mood and affect.    ED Course  Procedures (including critical care time) INCISION AND DRAINAGE Performed by: Lorenz Coaster Consent: Verbal consent obtained. Risks and benefits: risks, benefits and alternatives were discussed Type: abscess  Body area: right mons pubis  Anesthesia: local infiltration  Local anesthetic: lidocaine 2% without epinephrine  Anesthetic total: 0.5  ml  Complexity: complex Blunt dissection to break up loculations  Drainage: purulent  Drainage amount: moderate  Packing material: 1/4 in iodoform gauze  Patient tolerance: Patient tolerated the procedure well with no immediate complications.    Labs Reviewed - No data to display No results found.   1. Abscess of groin       MDM  Abscess, drained. Minimal induration, no signs cellulitis or systemic infection. Advised continued use of abx. Will give new pain rx. 2-day recheck.   Shaaron Adler, PA-C 12/31/11 1038

## 2012-01-02 ENCOUNTER — Emergency Department (HOSPITAL_COMMUNITY)
Admission: EM | Admit: 2012-01-02 | Discharge: 2012-01-02 | Disposition: A | Discharge status: Home / Self Care | Payer: Self-pay | Attending: Emergency Medicine | Admitting: Emergency Medicine

## 2012-01-02 ENCOUNTER — Encounter (HOSPITAL_COMMUNITY): Payer: Self-pay | Admitting: Physical Medicine and Rehabilitation

## 2012-01-02 DIAGNOSIS — J45909 Unspecified asthma, uncomplicated: Secondary | ICD-10-CM | POA: Insufficient documentation

## 2012-01-02 DIAGNOSIS — Z5189 Encounter for other specified aftercare: Secondary | ICD-10-CM

## 2012-01-02 DIAGNOSIS — Z4800 Encounter for change or removal of nonsurgical wound dressing: Secondary | ICD-10-CM | POA: Insufficient documentation

## 2012-01-02 NOTE — ED Notes (Signed)
Pt here for abscess follow up. Abscess to right perineal area. Currently covered with tape. No redness or swelling noted to site.

## 2012-01-02 NOTE — Discharge Instructions (Signed)
As discussed, if you develop any new, or concerning changes in your condition, please return to the ED immediately. 

## 2012-01-02 NOTE — ED Provider Notes (Signed)
History  Scribed for No att. providers found, the patient was seen in room STRE1/STRE1. This chart was scribed by Candelaria Stagers. The patient's care started at 1:39 PM    CSN: [id number]  Arrival date & time 01/02/12  1327   None     Chief Complaint  Patient presents with  . Abscess  . Follow-up    HPI [name] is a 26 y.o. female who presents to the Emergency Department for abscess of the groin f/u that was drained and packed two days ago.  She states that she has changed the bandage every four to six hours which has decreased somewhat.  She denies fever, nausea, or vomiting.  She is still experiencing pain.    Past Medical History  Diagnosis Date  . Asthma   . Headache     migraines  . Depression   . PIH (pregnancy induced hypertension)   . Late prenatal care     22wks  . Anemia 2007    Past Surgical History  Procedure Date  . No past surgeries     Family History  Problem Relation Age of Onset  . Anesthesia problems Neg Hx   . Hypotension Neg Hx   . Malignant hyperthermia Neg Hx   . Pseudochol deficiency Neg Hx     History  Substance Use Topics  . Smoking status: Current Everyday Smoker -- 0.5 packs/day    Types: Cigarettes  . Smokeless tobacco: Not on file  . Alcohol Use: No    OB History    Grav Para Term Preterm Abortions TAB SAB Ect Mult Living   3 2 2  0 1 0 1 0 0 2      Review of Systems  Constitutional: Negative for fever and chills.  Gastrointestinal: Negative for nausea and vomiting.  Skin:       Abscess of groin  Neurological: Negative for weakness.    Allergies  Corn syrup  Home Medications   Current Outpatient Rx  Name Route Sig Dispense Refill  . ALBUTEROL SULFATE HFA 108 (90 BASE) MCG/ACT IN AERS Inhalation Inhale 2 puffs into the lungs every 6 (six) hours as needed. For exercise-induced shortness of breath     . DIPHENHYDRAMINE HCL 25 MG PO CAPS Oral Take 25 mg by mouth 2 (two) times daily as needed. allergies     .  FERROUS SULFATE 325 (65 FE) MG PO TABS Oral Take 1 tablet (325 mg total) by mouth 3 (three) times daily with meals. 90 tablet 11  . FLUOCINONIDE 0.05 % EX CREA Topical Apply 1 application topically 2 (two) times daily as needed. For eczema     . HYDROCODONE-ACETAMINOPHEN 5-325 MG PO TABS Oral Take 1 tablet by mouth every 4 (four) hours as needed for pain. 10 tablet 0  . RANITIDINE HCL 150 MG PO CAPS Oral Take 150 mg by mouth 2 (two) times daily.      . SULFAMETHOXAZOLE-TMP DS 800-160 MG PO TABS Oral Take 1 tablet by mouth 2 (two) times daily. For 7 days Started 5/15      BP 114/55  Pulse 111  Temp(Src) 98.1 F (36.7 C) (Oral)  Resp 20  SpO2 100%  LMP 12/22/2011  Physical Exam  Nursing note and vitals reviewed. Constitutional: She is oriented to person, place, and time. She appears well-developed and well-nourished. No distress.  HENT:  Head: Normocephalic and atraumatic.  Eyes: EOM are normal.  Neck: Neck supple. No tracheal deviation present.  Cardiovascular: Normal rate.  Pulmonary/Chest: Effort normal. No respiratory distress.  Musculoskeletal: Normal range of motion.  Neurological: She is alert and oriented to person, place, and time.  Skin: Skin is warm and dry.       No new redness around abscess of groin.   Psychiatric: She has a normal mood and affect. Her behavior is normal.    ED Course  Procedures      COORDINATION OF CARE:  2:10 PM Discussed course of care including sitz baths and compresses and no need for f/u unless sx get worse.     Labs Reviewed - No data to display No results found.   No diagnosis found.    MDM  I personally performed the services described in this documentation, which was scribed in my presence. The recorded information has been reviewed and considered.  This otherwise well young F presents 2d after I&D of an abscess.  No concerning changes, and the wound is well appearing.  Packing removed, and the patient was discharged in  stable condition w care instructions / return precautions.        Gerhard Munch, MD 01/02/12 1623

## 2012-01-02 NOTE — ED Notes (Addendum)
Pt presents to department for evaluation of abscess would check. States she was seen x2 days ago for abscess located R perineal area. I&D was performed. Pt states site continues to drain, some relief from pain. No fever. She is alert and oriented x4.

## 2012-01-12 NOTE — ED Provider Notes (Signed)
Medical screening examination/treatment/procedure(s) were performed by non-physician practitioner and as supervising physician I was immediately available for consultation/collaboration.  Luiz Blare MD   Luiz Blare, MD 01/12/12 [phone]

## 2012-06-13 ENCOUNTER — Encounter (HOSPITAL_COMMUNITY): Payer: Self-pay | Admitting: Obstetrics and Gynecology

## 2012-06-13 ENCOUNTER — Inpatient Hospital Stay (HOSPITAL_COMMUNITY): Payer: Self-pay

## 2012-06-13 ENCOUNTER — Ambulatory Visit (HOSPITAL_COMMUNITY): Payer: Self-pay

## 2012-06-13 ENCOUNTER — Inpatient Hospital Stay (HOSPITAL_COMMUNITY)
Admission: AD | Admit: 2012-06-13 | Discharge: 2012-06-13 | Disposition: A | Discharge status: Home / Self Care | Payer: Self-pay | Source: Ambulatory Visit | Attending: Obstetrics & Gynecology | Admitting: Obstetrics & Gynecology

## 2012-06-13 DIAGNOSIS — O26899 Other specified pregnancy related conditions, unspecified trimester: Secondary | ICD-10-CM

## 2012-06-13 DIAGNOSIS — O2340 Unspecified infection of urinary tract in pregnancy, unspecified trimester: Secondary | ICD-10-CM

## 2012-06-13 DIAGNOSIS — O2390 Unspecified genitourinary tract infection in pregnancy, unspecified trimester: Secondary | ICD-10-CM | POA: Insufficient documentation

## 2012-06-13 DIAGNOSIS — W010XXA Fall on same level from slipping, tripping and stumbling without subsequent striking against object, initial encounter: Secondary | ICD-10-CM | POA: Insufficient documentation

## 2012-06-13 DIAGNOSIS — R109 Unspecified abdominal pain: Secondary | ICD-10-CM

## 2012-06-13 DIAGNOSIS — N390 Urinary tract infection, site not specified: Secondary | ICD-10-CM | POA: Insufficient documentation

## 2012-06-13 DIAGNOSIS — R1031 Right lower quadrant pain: Secondary | ICD-10-CM | POA: Insufficient documentation

## 2012-06-13 LAB — URINALYSIS, ROUTINE W REFLEX MICROSCOPIC
Bilirubin Urine: NEGATIVE
Glucose, UA: NEGATIVE mg/dL
Ketones, ur: NEGATIVE mg/dL
Nitrite: POSITIVE — AB
Specific Gravity, Urine: 1.025 (ref 1.005–1.030)
pH: 6 (ref 5.0–8.0)

## 2012-06-13 LAB — CBC
Hemoglobin: 12 g/dL (ref 12.0–15.0)
MCV: 85.4 fL (ref 78.0–100.0)
Platelets: 283 10*3/uL (ref 150–400)
RBC: 4.05 MIL/uL (ref 3.87–5.11)
WBC: 9.5 10*3/uL (ref 4.0–10.5)

## 2012-06-13 LAB — WET PREP, GENITAL
Clue Cells Wet Prep HPF POC: NONE SEEN
Yeast Wet Prep HPF POC: NONE SEEN

## 2012-06-13 LAB — URINE MICROSCOPIC-ADD ON

## 2012-06-13 MED ORDER — PROMETHAZINE HCL 25 MG PO TABS: 25.0000 mg | ORAL_TABLET | Freq: Four times a day (QID) | ORAL | Status: DC | PRN

## 2012-06-13 MED ORDER — ONDANSETRON 8 MG PO TBDP: 8.0000 mg | ORAL_TABLET | Freq: Three times a day (TID) | ORAL | Status: DC | PRN

## 2012-06-13 MED ORDER — NITROFURANTOIN MONOHYD MACRO 100 MG PO CAPS
100.0000 mg | ORAL_CAPSULE | Freq: Once | ORAL | Status: AC
  Administered 2012-06-13: 100 mg via ORAL
  Filled 2012-06-13: qty 1

## 2012-06-13 MED ORDER — NITROFURANTOIN MONOHYD MACRO 100 MG PO CAPS: 100.0000 mg | ORAL_CAPSULE | Freq: Once | ORAL | Status: DC

## 2012-06-13 NOTE — MAU Provider Note (Signed)
History     CSN: [id number]  Arrival date and time: 06/13/12 1216   First Provider Initiated Contact with Patient 06/13/12 1324      Chief Complaint  Patient presents with  . Fall   HPI [name] who presents with right lower quadrant/hip pain and right flank pain after falling after tripping over toy 2 to 3 hours ago.   She started feeling pain about 30 minutes to 1 hour after fall.  She took Tylenol at 11:50am.  She denies spotting or bleeding.  She has pain with urination with history of UTI.  Her urine is dark brown.  She denies fever.  She has had nausea.  She occasionally vomits.  She took her friend's Zofran which helped.  She has had some constipation.  She denies vaginal discharge.    Past Medical History  Diagnosis Date  . Asthma   . Headache     migraines  . Depression   . PIH (pregnancy induced hypertension)   . Late prenatal care     22wks  . Anemia 2007    Past Surgical History  Procedure Date  . No past surgeries     Family History  Problem Relation Age of Onset  . Anesthesia problems Neg Hx   . Hypotension Neg Hx   . Malignant hyperthermia Neg Hx   . Pseudochol deficiency Neg Hx     History  Substance Use Topics  . Smoking status: Current Every Day Smoker -- 0.5 packs/day    Types: Cigarettes  . Smokeless tobacco: Not on file  . Alcohol Use: No    Allergies:  Allergies  Allergen Reactions  . Corn Syrup Anaphylaxis and Hives    Prescriptions prior to admission  Medication Sig Dispense Refill  . diphenhydrAMINE (BENADRYL) 25 mg capsule Take 25 mg by mouth 2 (two) times daily as needed. allergies       . ferrous sulfate 325 (65 FE) MG tablet Take 1 tablet (325 mg total) by mouth 3 (three) times daily with meals.  90 tablet  11  . ranitidine (ZANTAC) 150 MG capsule Take 150 mg by mouth 2 (two) times daily.        Marland Kitchen albuterol (PROVENTIL HFA;VENTOLIN HFA) 108 (90 BASE) MCG/ACT inhaler Inhale 2 puffs into the lungs every 6  (six) hours as needed. For exercise-induced shortness of breath,rescue inhaler        Review of Systems  Constitutional: Negative for fever and chills.  Gastrointestinal: Positive for nausea and abdominal pain.  Genitourinary: Positive for dysuria. Negative for urgency.  Neurological: Negative for dizziness.   Physical Exam   Blood pressure 107/54, pulse 75, temperature 97.2 F (36.2 C), temperature source Oral, resp. rate 18, height 5\' 7"  (1.702 m), weight 74.56 kg (164 lb 6 oz), last menstrual period 03/18/2012, SpO2 100.00%, not currently breastfeeding.  Physical Exam  Nursing note and vitals reviewed. Constitutional: She is oriented to person, place, and time. She appears well-developed and well-nourished.  HENT:  Head: Normocephalic.  Eyes: Pupils are equal, round, and reactive to light.  Neck: Normal range of motion.  Cardiovascular: Normal rate.   Respiratory: Effort normal.  GI: Soft. There is tenderness. There is no rebound.       Right hip tenderness; right back pain; no rebound  Genitourinary:       Mod amount of yellow vaginal discharge; cervix clean, NT; uterus enlarged, NT; without of palpable enlargement or tenderness  Musculoskeletal: Normal range of motion.  Neurological: She is alert and oriented to person, place, and time.  Skin: Skin is warm and dry.  Psychiatric: She has a normal mood and affect.    MAU Course  Procedures Results for orders placed during the hospital encounter of 06/13/12 (from the past 24 hour(s))  URINALYSIS, ROUTINE W REFLEX MICROSCOPIC     Status: Abnormal   Collection Time   06/13/12 12:56 PM      Component Value Range   Color, Urine YELLOW  YELLOW   APPearance HAZY (*) CLEAR   Specific Gravity, Urine 1.025  1.005 - 1.030   pH 6.0  5.0 - 8.0   Glucose, UA NEGATIVE  NEGATIVE mg/dL   Hgb urine dipstick NEGATIVE  NEGATIVE   Bilirubin Urine NEGATIVE  NEGATIVE   Ketones, ur NEGATIVE  NEGATIVE mg/dL   Protein, ur NEGATIVE  NEGATIVE  mg/dL   Urobilinogen, UA 0.2  0.0 - 1.0 mg/dL   Nitrite POSITIVE (*) NEGATIVE   Leukocytes, UA MODERATE (*) NEGATIVE  URINE MICROSCOPIC-ADD ON     Status: Abnormal   Collection Time   06/13/12 12:56 PM      Component Value Range   Squamous Epithelial / LPF FEW (*) RARE   WBC, UA 11-20  <3 WBC/hpf   Bacteria, UA MANY (*) RARE  POCT PREGNANCY, URINE     Status: Abnormal   Collection Time   06/13/12  1:11 PM      Component Value Range   Preg Test, Ur POSITIVE (*) NEGATIVE  CBC     Status: Abnormal   Collection Time   06/13/12  1:15 PM      Component Value Range   WBC 9.5  4.0 - 10.5 K/uL   RBC 4.05  3.87 - 5.11 MIL/uL   Hemoglobin 12.0  12.0 - 15.0 g/dL   HCT 16.1 (*) 09.6 - 04.5 %   MCV 85.4  78.0 - 100.0 fL   MCH 29.6  26.0 - 34.0 pg   MCHC 34.7  30.0 - 36.0 g/dL   RDW 40.9  81.1 - 91.4 %   Platelets 283  150 - 400 K/uL  WET PREP, GENITAL     Status: Abnormal   Collection Time   06/13/12  1:35 PM      Component Value Range   Yeast Wet Prep HPF POC NONE SEEN  NONE SEEN   Trich, Wet Prep NONE SEEN  NONE SEEN   Clue Cells Wet Prep HPF POC NONE SEEN  NONE SEEN   WBC, Wet Prep HPF POC MANY (*) NONE SEEN   Urine culture pending Assessment and Plan  abd pain in pregnancy UTI in pregnancy Macrobid 100 mg BID for 7 days Zofran and Phenergan prescriptions for nausea F/u for OB care with provider of choice F/u sooner for increase in pain or fever  [name] 06/13/2012, 1:24 PM

## 2012-06-13 NOTE — MAU Note (Signed)
Pt states tripped over toys in childs playroom, missed first two toys, then fell on ground. Fell on floor landing on her right side/hip/flank. 30 minutes later, pt began having sharp pain in left lower pelvic/abdominal area. Denies abnormal discharge or bleeding. Urine is very dark per pt and has strong odor. Intermittent burning with voiding.

## 2012-06-14 LAB — GC/CHLAMYDIA PROBE AMP, GENITAL: GC Probe Amp, Genital: NEGATIVE

## 2012-06-15 LAB — URINE CULTURE

## 2012-07-28 ENCOUNTER — Inpatient Hospital Stay (HOSPITAL_COMMUNITY)
Admission: AD | Admit: 2012-07-28 | Discharge: 2012-07-28 | Disposition: A | Discharge status: Home / Self Care | Payer: Self-pay | Source: Ambulatory Visit | Attending: Obstetrics and Gynecology | Admitting: Obstetrics and Gynecology

## 2012-07-28 ENCOUNTER — Encounter (HOSPITAL_COMMUNITY): Payer: Self-pay | Admitting: *Deleted

## 2012-07-28 DIAGNOSIS — M545 Low back pain, unspecified: Secondary | ICD-10-CM | POA: Insufficient documentation

## 2012-07-28 DIAGNOSIS — O2340 Unspecified infection of urinary tract in pregnancy, unspecified trimester: Secondary | ICD-10-CM

## 2012-07-28 DIAGNOSIS — O2390 Unspecified genitourinary tract infection in pregnancy, unspecified trimester: Secondary | ICD-10-CM | POA: Insufficient documentation

## 2012-07-28 DIAGNOSIS — R1032 Left lower quadrant pain: Secondary | ICD-10-CM | POA: Insufficient documentation

## 2012-07-28 DIAGNOSIS — N390 Urinary tract infection, site not specified: Secondary | ICD-10-CM | POA: Insufficient documentation

## 2012-07-28 DIAGNOSIS — R300 Dysuria: Secondary | ICD-10-CM | POA: Insufficient documentation

## 2012-07-28 LAB — URINE MICROSCOPIC-ADD ON

## 2012-07-28 LAB — URINALYSIS, ROUTINE W REFLEX MICROSCOPIC
Glucose, UA: NEGATIVE mg/dL
Leukocytes, UA: NEGATIVE
Protein, ur: NEGATIVE mg/dL
Urobilinogen, UA: 1 mg/dL (ref 0.0–1.0)

## 2012-07-28 MED ORDER — CEPHALEXIN 500 MG PO CAPS: 500.0000 mg | ORAL_CAPSULE | Freq: Two times a day (BID) | ORAL | Status: DC

## 2012-07-28 NOTE — MAU Provider Note (Signed)
26 y.o. [id number] at [redacted]w[redacted]d with pain with urination in lower left abdomen. No fever, chills, n/v, flank pain. Does c/o low back pain that radiates down leg. Pt waiting in MAU lobby for exam room, came to desk and stated that she was going to leave prior to being seen. As urine result was already back and appeared c/w UTI, I called her in to triage to go over result and offer treatment. MSE completed.   Physical Exam  Nursing note and vitals reviewed. Constitutional: She is oriented to person, place, and time. She appears well-developed and well-nourished. No distress.  Cardiovascular: Normal rate.   Pulmonary/Chest: Effort normal.  Abdominal:       Negative CVA tenderness   Musculoskeletal: Normal range of motion.  Neurological: She is alert and oriented to person, place, and time.  Skin: Skin is warm and dry.  Psychiatric: She has a normal mood and affect.   Results: Results for orders placed during the hospital encounter of 07/28/12 (from the past 24 hour(s))  URINALYSIS, ROUTINE W REFLEX MICROSCOPIC     Status: Abnormal   Collection Time   07/28/12 12:42 PM      Component Value Range   Color, Urine YELLOW  YELLOW   APPearance HAZY (*) CLEAR   Specific Gravity, Urine 1.020  1.005 - 1.030   pH 8.0  5.0 - 8.0   Glucose, UA NEGATIVE  NEGATIVE mg/dL   Hgb urine dipstick NEGATIVE  NEGATIVE   Bilirubin Urine NEGATIVE  NEGATIVE   Ketones, ur NEGATIVE  NEGATIVE mg/dL   Protein, ur NEGATIVE  NEGATIVE mg/dL   Urobilinogen, UA 1.0  0.0 - 1.0 mg/dL   Nitrite POSITIVE (*) NEGATIVE   Leukocytes, UA NEGATIVE  NEGATIVE  URINE MICROSCOPIC-ADD ON     Status: Abnormal   Collection Time   07/28/12 12:42 PM      Component Value Range   Squamous Epithelial / LPF MANY (*) RARE   WBC, UA 3-6  <3 WBC/hpf   Bacteria, UA MANY (*) RARE   A/P: 1. UTI in pregnancy       Medication List     As of 07/28/2012  4:53 PM    START taking these medications         cephALEXin 500 MG capsule   Commonly  known as: KEFLEX   Take 1 capsule (500 mg total) by mouth 2 (two) times daily.      CONTINUE taking these medications         albuterol 108 (90 BASE) MCG/ACT inhaler   Commonly known as: PROVENTIL HFA;VENTOLIN HFA      diphenhydrAMINE 25 mg capsule   Commonly known as: BENADRYL      ferrous sulfate 325 (65 FE) MG tablet   Take 1 tablet (325 mg total) by mouth 3 (three) times daily with meals.      ondansetron 8 MG disintegrating tablet   Commonly known as: ZOFRAN-ODT   Take 1 tablet (8 mg total) by mouth every 8 (eight) hours as needed for nausea.      promethazine 25 MG tablet   Commonly known as: PHENERGAN   Take 1 tablet (25 mg total) by mouth every 6 (six) hours as needed for nausea.      ranitidine 150 MG capsule   Commonly known as: ZANTAC      STOP taking these medications         nitrofurantoin (macrocrystal-monohydrate) 100 MG capsule   Commonly known as: MACROBID  Where to get your medications    These are the prescriptions that you need to pick up. We sent them to a specific pharmacy, so you will need to go there to get them.   [facility] [address], [place] - [address]    [address] [place] [address]    Phone: [phone]        cephALEXin 500 MG capsule            Follow-up Information    Follow up with provider of your choice. (start prenatal care as soon as possible)

## 2012-07-28 NOTE — MAU Note (Signed)
Where tailbone is hitting sciatic nerve  Causing pain and numbness down left leg- started last night.  Woke up with severe pain in lower abd- a little to rt side.  Feels like a poker is hitting it when goes to the bathroom.  Denies frequency or urgency.

## 2012-07-30 LAB — URINE CULTURE

## 2012-08-01 NOTE — MAU Provider Note (Signed)
Attestation of Attending Supervision of Advanced Practitioner (CNM/NP): Evaluation and management procedures were performed by the Advanced Practitioner under my supervision and collaboration.  I have reviewed the Advanced Practitioner's note and chart, and I agree with the management and plan.  Latonda Larrivee 08/01/2012 5:45 PM

## 2012-09-16 ENCOUNTER — Encounter (HOSPITAL_COMMUNITY): Payer: Self-pay | Admitting: Nurse Practitioner

## 2012-09-16 ENCOUNTER — Emergency Department (HOSPITAL_COMMUNITY)
Admission: EM | Admit: 2012-09-16 | Discharge: 2012-09-16 | Disposition: A | Discharge status: Home / Self Care | Payer: Medicaid Other | Attending: Emergency Medicine | Admitting: Emergency Medicine

## 2012-09-16 DIAGNOSIS — O99019 Anemia complicating pregnancy, unspecified trimester: Secondary | ICD-10-CM | POA: Insufficient documentation

## 2012-09-16 DIAGNOSIS — Z8679 Personal history of other diseases of the circulatory system: Secondary | ICD-10-CM | POA: Insufficient documentation

## 2012-09-16 DIAGNOSIS — O9989 Other specified diseases and conditions complicating pregnancy, childbirth and the puerperium: Secondary | ICD-10-CM | POA: Insufficient documentation

## 2012-09-16 DIAGNOSIS — K0889 Other specified disorders of teeth and supporting structures: Secondary | ICD-10-CM

## 2012-09-16 DIAGNOSIS — Z8659 Personal history of other mental and behavioral disorders: Secondary | ICD-10-CM | POA: Insufficient documentation

## 2012-09-16 DIAGNOSIS — J45909 Unspecified asthma, uncomplicated: Secondary | ICD-10-CM | POA: Insufficient documentation

## 2012-09-16 DIAGNOSIS — Z79899 Other long term (current) drug therapy: Secondary | ICD-10-CM | POA: Insufficient documentation

## 2012-09-16 DIAGNOSIS — O99330 Smoking (tobacco) complicating pregnancy, unspecified trimester: Secondary | ICD-10-CM | POA: Insufficient documentation

## 2012-09-16 DIAGNOSIS — R509 Fever, unspecified: Secondary | ICD-10-CM | POA: Insufficient documentation

## 2012-09-16 DIAGNOSIS — D649 Anemia, unspecified: Secondary | ICD-10-CM | POA: Insufficient documentation

## 2012-09-16 DIAGNOSIS — K089 Disorder of teeth and supporting structures, unspecified: Secondary | ICD-10-CM | POA: Insufficient documentation

## 2012-09-16 MED ORDER — OXYCODONE-ACETAMINOPHEN 5-325 MG PO TABS: ORAL_TABLET | ORAL | Status: DC

## 2012-09-16 MED ORDER — OXYCODONE-ACETAMINOPHEN 5-325 MG PO TABS
2.0000 | ORAL_TABLET | Freq: Once | ORAL | Status: AC
  Administered 2012-09-16: 2 via ORAL
  Filled 2012-09-16: qty 2

## 2012-09-16 MED ORDER — AMOXICILLIN 500 MG PO CAPS: 500.0000 mg | ORAL_CAPSULE | Freq: Three times a day (TID) | ORAL | Status: DC

## 2012-09-16 MED ORDER — AMOXICILLIN 500 MG PO CAPS
500.0000 mg | ORAL_CAPSULE | Freq: Once | ORAL | Status: AC
  Administered 2012-09-16: 500 mg via ORAL
  Filled 2012-09-16: qty 1

## 2012-09-16 NOTE — ED Notes (Signed)
Pt c/o r sided toothache for past 2 days. No money for a dentist

## 2012-09-16 NOTE — ED Provider Notes (Signed)
History     CSN: [id number]  Arrival date & time 09/16/12  5784   First MD Initiated Contact with Patient 09/16/12 1108      Chief Complaint  Patient presents with  . Dental Pain    (Consider location/radiation/quality/duration/timing/severity/associated sxs/prior treatment) HPI  SITLALI KOERNER is a 27 y.o. female [redacted] weeks pregnant (No complications, followed by OB/Gyn) complaining of left lower posterior tooth pain worsening x2 weeks after a filling fell out. Pt had fever with tMax of 100.2 last night. Denies nausea and vomitting.   Past Medical History  Diagnosis Date  . Asthma   . Headache     migraines  . Depression   . PIH (pregnancy induced hypertension)   . Late prenatal care     22wks  . Anemia 2007    Past Surgical History  Procedure Date  . No past surgeries     Family History  Problem Relation Age of Onset  . Anesthesia problems Neg Hx   . Hypotension Neg Hx   . Malignant hyperthermia Neg Hx   . Pseudochol deficiency Neg Hx   . Mental retardation Maternal Grandmother     History  Substance Use Topics  . Smoking status: Current Every Day Smoker -- 0.5 packs/day    Types: Cigarettes  . Smokeless tobacco: Not on file  . Alcohol Use: No    OB History    Grav Para Term Preterm Abortions TAB SAB Ect Mult Living   5 2 2  0 2 0 2 0 0 2      Review of Systems  Constitutional: Positive for fever.  HENT: Positive for dental problem.   Respiratory: Negative for shortness of breath.   Cardiovascular: Negative for chest pain.  Gastrointestinal: Negative for nausea, vomiting, abdominal pain and diarrhea.  All other systems reviewed and are negative.    Allergies  Corn syrup  Home Medications   Current Outpatient Rx  Name  Route  Sig  Dispense  Refill  . ALBUTEROL SULFATE HFA 108 (90 BASE) MCG/ACT IN AERS   Inhalation   Inhale 2 puffs into the lungs every 6 (six) hours as needed. For exercise-induced shortness of breath,rescue inhaler         . DIPHENHYDRAMINE HCL 25 MG PO CAPS   Oral   Take 25 mg by mouth 2 (two) times daily as needed. allergies          . FERROUS SULFATE 325 (65 FE) MG PO TABS   Oral   Take 325 mg by mouth daily with breakfast.         . ONDANSETRON 8 MG PO TBDP   Oral   Take 8 mg by mouth every 8 (eight) hours as needed. For nausea         . PROMETHAZINE HCL 25 MG PO TABS   Oral   Take 25 mg by mouth every 6 (six) hours as needed. For nausea         . RANITIDINE HCL 150 MG PO CAPS   Oral   Take 150 mg by mouth 2 (two) times daily.             BP 110/63  Pulse 100  Temp 98.7 F (37.1 C) (Oral)  Resp 16  SpO2 99%  LMP 03/18/2012  Breastfeeding? Unknown  Physical Exam  Nursing note and vitals reviewed. Constitutional: She is oriented to person, place, and time. She appears well-developed and well-nourished. No distress.  HENT:  Head: Normocephalic.  Mouth/Throat:  Eyes: Conjunctivae normal and EOM are normal.  Cardiovascular: Normal rate.   Pulmonary/Chest: Effort normal. No stridor.  Musculoskeletal: Normal range of motion.  Neurological: She is alert and oriented to person, place, and time.  Psychiatric: She has a normal mood and affect.    ED Course  Procedures (including critical care time)  Labs Reviewed - No data to display No results found.   1. Pain, dental       MDM   Uncomplicated dental pain.  Filed Vitals:   09/16/12 1001  BP: 110/63  Pulse: 100  Temp: 98.7 F (37.1 C)  TempSrc: Oral  Resp: 16  SpO2: 99%     Pt verbalized understanding and agrees with care plan. Outpatient follow-up and return precautions given.    New Prescriptions   AMOXICILLIN (AMOXIL) 500 MG CAPSULE    Take 1 capsule (500 mg total) by mouth 3 (three) times daily.   OXYCODONE-ACETAMINOPHEN (PERCOCET/ROXICET) 5-325 MG PER TABLET    1 to 2 tabs PO q6hrs  PRN for pain            Wynetta Emery, PA-C 09/16/12 2208

## 2012-09-17 NOTE — ED Provider Notes (Signed)
Medical screening examination/treatment/procedure(s) were performed by non-physician practitioner and as supervising physician I was immediately available for consultation/collaboration.   Eliyanah Elgersma E Larene Ascencio, MD 09/17/12 1450 

## 2012-11-24 ENCOUNTER — Encounter (HOSPITAL_COMMUNITY): Payer: Self-pay | Admitting: *Deleted

## 2012-11-24 ENCOUNTER — Emergency Department (HOSPITAL_COMMUNITY)
Admission: EM | Admit: 2012-11-24 | Discharge: 2012-11-24 | Disposition: A | Discharge status: Home / Self Care | Payer: Medicaid Other | Attending: Emergency Medicine | Admitting: Emergency Medicine

## 2012-11-24 DIAGNOSIS — K089 Disorder of teeth and supporting structures, unspecified: Secondary | ICD-10-CM | POA: Insufficient documentation

## 2012-11-24 DIAGNOSIS — Z8659 Personal history of other mental and behavioral disorders: Secondary | ICD-10-CM | POA: Insufficient documentation

## 2012-11-24 DIAGNOSIS — Z8679 Personal history of other diseases of the circulatory system: Secondary | ICD-10-CM | POA: Insufficient documentation

## 2012-11-24 DIAGNOSIS — O99019 Anemia complicating pregnancy, unspecified trimester: Secondary | ICD-10-CM | POA: Insufficient documentation

## 2012-11-24 DIAGNOSIS — J45909 Unspecified asthma, uncomplicated: Secondary | ICD-10-CM | POA: Insufficient documentation

## 2012-11-24 DIAGNOSIS — O9989 Other specified diseases and conditions complicating pregnancy, childbirth and the puerperium: Secondary | ICD-10-CM | POA: Insufficient documentation

## 2012-11-24 DIAGNOSIS — K0889 Other specified disorders of teeth and supporting structures: Secondary | ICD-10-CM

## 2012-11-24 DIAGNOSIS — Z79899 Other long term (current) drug therapy: Secondary | ICD-10-CM | POA: Insufficient documentation

## 2012-11-24 DIAGNOSIS — K006 Disturbances in tooth eruption: Secondary | ICD-10-CM | POA: Insufficient documentation

## 2012-11-24 DIAGNOSIS — O99330 Smoking (tobacco) complicating pregnancy, unspecified trimester: Secondary | ICD-10-CM | POA: Insufficient documentation

## 2012-11-24 MED ORDER — AMOXICILLIN 500 MG PO CAPS: 500.0000 mg | ORAL_CAPSULE | Freq: Three times a day (TID) | ORAL | Status: DC

## 2012-11-24 MED ORDER — OXYCODONE-ACETAMINOPHEN 5-325 MG PO TABS: 1.0000 | ORAL_TABLET | Freq: Four times a day (QID) | ORAL | Status: DC | PRN

## 2012-11-24 NOTE — ED Provider Notes (Signed)
Medical screening examination/treatment/procedure(s) were performed by non-physician practitioner and as supervising physician I was immediately available for consultation/collaboration.  Flint Melter, MD 11/24/12 [phone]

## 2012-11-24 NOTE — ED Notes (Signed)
Triage notes made by this RN.

## 2012-11-24 NOTE — ED Notes (Signed)
Pt was seen here 09/16/2012 for dental abscess. Was tx with abx and referred to a dentist. The dentist told her she couldn't do anything because of pregnancy.The pt took a note from her OB/GYN to the dentist but she still would not treat the pt. Told her to come back to [facility].

## 2012-11-24 NOTE — ED Provider Notes (Signed)
History     CSN: [id number]  Arrival date & time 11/24/12  1242   First MD Initiated Contact with Patient 11/24/12 1351      Chief Complaint  Patient presents with  . Dental Pain    (Consider location/radiation/quality/duration/timing/severity/associated sxs/prior treatment) HPI Comments: Patient who is currently [redacted] weeks pregnant presents today with right sided lower dental pain.  Pain has been present for a couple months, but worse over the past couple of days.  She states that chewing makes the pain worse.  She met with a dentist, but was told that she did not feel comfortable pulling the tooth because she is pregnant.  Patient has spoken with her OB/GYN about the dental pain and reports that her OB is trying to find her a dentist that will pull the tooth..  She states that her OB stated that she could take Amoxicillin and Percocet for the pain.  She has been taking Tylenol for the pain without relief. She denies any pelvic pain, leakage of fluid, vaginal bleeding, or vaginal discharge at this time.    Patient is a 27 y.o. female presenting with tooth pain. The history is provided by the patient.  Dental PainThe primary symptoms include mouth pain. Primary symptoms do not include dental injury or fever. The symptoms are worsening. The symptoms occur constantly.  Additional symptoms include: dental sensitivity to temperature, gum swelling and gum tenderness. Additional symptoms do not include: purulent gums, trismus, facial swelling and trouble swallowing.    Past Medical History  Diagnosis Date  . Asthma   . Headache     migraines  . Depression   . PIH (pregnancy induced hypertension)   . Late prenatal care     22wks  . Anemia 2007    Past Surgical History  Procedure Laterality Date  . No past surgeries      Family History  Problem Relation Age of Onset  . Anesthesia problems Neg Hx   . Hypotension Neg Hx   . Malignant hyperthermia Neg Hx   . Pseudochol deficiency Neg  Hx   . Mental retardation Maternal Grandmother     History  Substance Use Topics  . Smoking status: Current Every Day Smoker -- 0.50 packs/day    Types: Cigarettes  . Smokeless tobacco: Not on file  . Alcohol Use: No    OB History   Grav Para Term Preterm Abortions TAB SAB Ect Mult Living   5 2 2  0 2 0 2 0 0 2      Review of Systems  Constitutional: Negative for fever and chills.  HENT: Positive for dental problem. Negative for facial swelling, trouble swallowing, neck pain and neck stiffness.   All other systems reviewed and are negative.    Allergies  Corn syrup  Home Medications   Current Outpatient Rx  Name  Route  Sig  Dispense  Refill  . Prenatal Vit-Fe Fumarate-FA (MULTIVITAMIN-PRENATAL) 27-0.8 MG TABS   Oral   Take 1 tablet by mouth daily at 12 noon.         . ranitidine (ZANTAC) 150 MG capsule   Oral   Take 150 mg by mouth 2 (two) times daily as needed for heartburn.          Marland Kitchen albuterol (PROVENTIL HFA;VENTOLIN HFA) 108 (90 BASE) MCG/ACT inhaler   Inhalation   Inhale 2 puffs into the lungs every 6 (six) hours as needed. For exercise-induced shortness of breath,rescue inhaler         .  ferrous sulfate 325 (65 FE) MG tablet   Oral   Take 325 mg by mouth 3 (three) times daily with meals.          . ondansetron (ZOFRAN-ODT) 8 MG disintegrating tablet   Oral   Take 8 mg by mouth every 8 (eight) hours as needed. For nausea           BP 120/84  Pulse 100  Temp(Src) 97.9 F (36.6 C) (Oral)  Resp 16  SpO2 100%  LMP 03/18/2012  Physical Exam  Nursing note and vitals reviewed. Constitutional: She is oriented to person, place, and time. She appears well-developed and well-nourished. No distress.  HENT:  Head: Normocephalic and atraumatic. No trismus in the jaw.  Mouth/Throat: Uvula is midline, oropharynx is clear and moist and mucous membranes are normal. Abnormal dentition. No dental abscesses or edematous. No oropharyngeal exudate, posterior  oropharyngeal edema, posterior oropharyngeal erythema or tonsillar abscesses.  Poor dental hygiene. Pt able to open and close mouth with out difficulty. Airway intact. Uvula midline. Mild gingival swelling with tenderness over affected area, but no fluctuance. No swelling or tenderness of submental and submandibular regions.  No tongue elevation.  Eyes: Conjunctivae are normal.  Neck: Normal range of motion and full passive range of motion without pain. Neck supple.  Cardiovascular: Normal rate and regular rhythm.   Pulmonary/Chest: Effort normal and breath sounds normal. No respiratory distress. She has no wheezes.  Musculoskeletal: Normal range of motion.  Lymphadenopathy:       Head (right side): No submental, no submandibular, no tonsillar, no preauricular and no posterior auricular adenopathy present.       Head (left side): No submental, no submandibular, no tonsillar, no preauricular and no posterior auricular adenopathy present.  Neurological: She is alert and oriented to person, place, and time.  Skin: Skin is warm and dry. No rash noted. She is not diaphoretic.    ED Course  Procedures (including critical care time)  Labs Reviewed - No data to display No results found.   No diagnosis found.  Patient explained the potential risks of taking Narcotic Pain medications while pregnant.  She verbalizes understanding.  MDM  Patient with toothache.  No gross abscess.  Exam unconcerning for Ludwig's angina or spread of infection.  Will treat with Amoxicillin and pain medicine.  Urged patient to follow-up with dentist.          Pascal Lux [place], PA-C 11/24/12 1531

## 2012-11-24 NOTE — ED Notes (Signed)
Pt reports lower right sided dental pain for the pain couple of days. States she went to a dentist but was told she could not have the tooth pulled because she was [redacted] weeks pregnant. Back because the pain is now worse.

## 2012-12-03 ENCOUNTER — Emergency Department (HOSPITAL_COMMUNITY)
Admission: EM | Admit: 2012-12-03 | Discharge: 2012-12-03 | Disposition: A | Discharge status: Home / Self Care | Payer: Medicaid Other | Attending: Emergency Medicine | Admitting: Emergency Medicine

## 2012-12-03 ENCOUNTER — Encounter (HOSPITAL_COMMUNITY): Payer: Self-pay | Admitting: Emergency Medicine

## 2012-12-03 DIAGNOSIS — Z3490 Encounter for supervision of normal pregnancy, unspecified, unspecified trimester: Secondary | ICD-10-CM

## 2012-12-03 DIAGNOSIS — Z79899 Other long term (current) drug therapy: Secondary | ICD-10-CM | POA: Insufficient documentation

## 2012-12-03 DIAGNOSIS — Z8679 Personal history of other diseases of the circulatory system: Secondary | ICD-10-CM | POA: Insufficient documentation

## 2012-12-03 DIAGNOSIS — K0889 Other specified disorders of teeth and supporting structures: Secondary | ICD-10-CM

## 2012-12-03 DIAGNOSIS — F17200 Nicotine dependence, unspecified, uncomplicated: Secondary | ICD-10-CM | POA: Insufficient documentation

## 2012-12-03 DIAGNOSIS — F329 Major depressive disorder, single episode, unspecified: Secondary | ICD-10-CM | POA: Insufficient documentation

## 2012-12-03 DIAGNOSIS — K0381 Cracked tooth: Secondary | ICD-10-CM | POA: Insufficient documentation

## 2012-12-03 DIAGNOSIS — R42 Dizziness and giddiness: Secondary | ICD-10-CM | POA: Insufficient documentation

## 2012-12-03 DIAGNOSIS — F3289 Other specified depressive episodes: Secondary | ICD-10-CM | POA: Insufficient documentation

## 2012-12-03 DIAGNOSIS — K089 Disorder of teeth and supporting structures, unspecified: Secondary | ICD-10-CM | POA: Insufficient documentation

## 2012-12-03 DIAGNOSIS — R630 Anorexia: Secondary | ICD-10-CM | POA: Insufficient documentation

## 2012-12-03 DIAGNOSIS — Z8742 Personal history of other diseases of the female genital tract: Secondary | ICD-10-CM | POA: Insufficient documentation

## 2012-12-03 DIAGNOSIS — J45909 Unspecified asthma, uncomplicated: Secondary | ICD-10-CM | POA: Insufficient documentation

## 2012-12-03 DIAGNOSIS — D649 Anemia, unspecified: Secondary | ICD-10-CM | POA: Insufficient documentation

## 2012-12-03 DIAGNOSIS — Z331 Pregnant state, incidental: Secondary | ICD-10-CM | POA: Insufficient documentation

## 2012-12-03 NOTE — ED Notes (Signed)
Family at bedside. 

## 2012-12-03 NOTE — ED Provider Notes (Signed)
History    This chart was scribed for non-physician practitioner Raymon Mutton working with Ward Givens, MD by Quintella Reichert, ED Scribe. This patient was seen in room TR07C/TR07C and the patient's care was started at 3:37 PM .   CSN: [id number]  Arrival date & time 12/03/12  1416     Chief Complaint  Patient presents with  . Dental Pain     The history is provided by the patient. No language interpreter was used.   [name] is a 27 y.o. female who presents to the Emergency Department complaining of constant, right lower dental pain that began 3 weeks ago, accompanied by a visibly-cracked tooth in that area.  Pt describes pain as "like someone sticking a hot poker in it" and "twisting like a screwdriver," and states it radiates beneath right ear.  Pain is exacerbated by cold fluids and by eating, and pt has lost 8 pounds in the past 2 weeks due to decreased appetite secondary to pain.  She rates pain as 6.5/10 currently, and states it is relieved slightly by applying a warm washcloth to the right side of her face and by lying down.  She also reports mild lightheadedness.  Pt is [redacted] weeks pregnant and states that no providers would agree to remove the tooth due to pt's stage of pregnancy.  She denies facial swelling, discharge or draining from area, headaches, fever, chills, visual changes, abscess, difficulty swallowing, CP, SOB, difficulty breathing, urinary or bowel symptoms, nausea, emesis, diarrhea, or any other associated symptoms.  She does not recall eating anything that may have caused crack in tooth.  Pt finished a course of Amoxicillin last week, and was medicating with oxycodone until she ran out last week.  Patient has spoken with her OB/GYN about the dental pain and reports that her OB recommends excision of tooth, and is concerned over pt's weight loss.    Past Medical History  Diagnosis Date  . Asthma   . Headache     migraines  . Depression   . PIH (pregnancy  induced hypertension)   . Late prenatal care     22wks  . Anemia 2007    Past Surgical History  Procedure Laterality Date  . No past surgeries      Family History  Problem Relation Age of Onset  . Anesthesia problems Neg Hx   . Hypotension Neg Hx   . Malignant hyperthermia Neg Hx   . Pseudochol deficiency Neg Hx   . Mental retardation Maternal Grandmother     History  Substance Use Topics  . Smoking status: Current Every Day Smoker -- 0.50 packs/day    Types: Cigarettes  . Smokeless tobacco: Not on file  . Alcohol Use: No    OB History   Grav Para Term Preterm Abortions TAB SAB Ect Mult Living   5 2 2  0 2 0 2 0 0 2      Review of Systems  Constitutional: Negative for fever and chills.  HENT: Positive for dental problem. Negative for facial swelling, mouth sores and trouble swallowing.   Eyes: Negative for visual disturbance.  Respiratory: Negative for shortness of breath.   Cardiovascular: Negative for chest pain.  Gastrointestinal: Negative for nausea, vomiting, diarrhea and constipation.  Genitourinary: Negative for dysuria and difficulty urinating.  Neurological: Positive for light-headedness (Mild). Negative for headaches.  All other systems reviewed and are negative.     Allergies  Corn syrup  Home Medications   Current Outpatient Rx  Name  Route  Sig  Dispense  Refill  . albuterol (PROVENTIL HFA;VENTOLIN HFA) 108 (90 BASE) MCG/ACT inhaler   Inhalation   Inhale 2 puffs into the lungs every 6 (six) hours as needed. For exercise-induced shortness of breath,rescue inhaler         . amoxicillin (AMOXIL) 500 MG capsule   Oral   Take 1 capsule (500 mg total) by mouth 3 (three) times daily.   30 capsule   0   . ferrous sulfate 325 (65 FE) MG tablet   Oral   Take 325 mg by mouth 3 (three) times daily with meals.          Marland Kitchen oxyCODONE-acetaminophen (PERCOCET/ROXICET) 5-325 MG per tablet   Oral   Take 1-2 tablets by mouth every 6 (six) hours as  needed for pain.   20 tablet   0   . Prenatal Vit-Fe Fumarate-FA (MULTIVITAMIN-PRENATAL) 27-0.8 MG TABS   Oral   Take 1 tablet by mouth daily at 12 noon.           BP 118/81  Pulse 99  Temp(Src) 98.4 F (36.9 C) (Oral)  Resp 18  SpO2 98%  LMP 03/18/2012   Physical Exam  Nursing note and vitals reviewed. Constitutional: She is oriented to person, place, and time. She appears well-developed and well-nourished. No distress.  HENT:  Head: Normocephalic and atraumatic.  Mouth/Throat: Oropharynx is clear and moist. No oropharyngeal exudate.    No facial swelling, erythema, lesions, inflammation noted to face.  Mouth: Right 1st molar, lower jaw, tooth cracked in half.  No swelling, erythema, inflammation, abscess or drainage.  Discomfort caused by palpation with swab. No sublingual lesion. Patient able to open and close jaw.  Eyes: Conjunctivae and EOM are normal. Pupils are equal, round, and reactive to light. Right eye exhibits no discharge. Left eye exhibits no discharge.  Neck: Normal range of motion. Neck supple. No tracheal deviation present.  Negative lymphadenopathy  Cardiovascular: Normal rate, regular rhythm and normal heart sounds.   No murmur heard. Radial pulses 2+ bilaterally  Pulmonary/Chest: Effort normal and breath sounds normal. No respiratory distress. She has no wheezes. She has no rales.  Musculoskeletal: Normal range of motion. She exhibits no tenderness.  Lymphadenopathy:    She has no cervical adenopathy.  Neurological: She is alert and oriented to person, place, and time. No cranial nerve deficit. She exhibits normal muscle tone. Coordination normal.  Skin: Skin is warm and dry. No rash noted. No erythema.  Psychiatric: She has a normal mood and affect. Her behavior is normal. Thought content normal.     ED Course  Procedures (including critical care time)  DIAGNOSTIC STUDIES: Oxygen Saturation is 98% on room air, normal by my interpretation.     COORDINATION OF CARE: 3:47 PM-Recommended removal of cracked tooth, and explained that prescription pain medications would not be prescribed due to pregnancy.  Discussed treatment plan with pt at bedside, which includes OTC Tylenol 325 mg and ice application for pain, soft foods diet, f/u with dentist and oral surgeon to have tooth removed, and f/u OB/GYN due to weight loss.  Advised f/u at ED if any new symptoms develop.  Pt agreed to plan.      Labs Reviewed - No data to display No results found.   1. Pain, dental   2. Pregnant       MDM  I personally performed the services described in this documentation, which was scribed in my presence. The recorded information  has been reviewed and is accurate.  Patient afebrile, normotensive, non-tachycardic, alert and oriented. Right 2nd molar of lower jaw cracked in half - positive pain upon palpation with cotton swab. Discussed with patient that only relief will be with removal of tooth, but difficult due to being in third trimester of pregnancy. Patient aseptic, non-toxic appearing, alert and oriented. Discharged patient. No antibiotics given due to patient just finishing course. Pain medications not given due to pregnancy course and being in third trimester. Discussed with patient that only Tylenol can used at the moment, continue to apply ice to right side of face to help soothe. Recommended patient to oral surgeon to follow-up with and to follow-up with OBGYN. Discussed with patient to follow-up with OBGYN regarding weight loss secondary to pain, OBGYN should be monitoring this - recommended patient to drink Ensure to help weight control. Discussed with patient to monitor symptoms and if symptoms are to worsen or change to report back to the ED. Patient agreed to plan of care, understood, all questions answered.    Raymon Mutton, PA-C 12/04/12 1043 ED provider identified for [facility], [place] 12/05/12 1824

## 2012-12-03 NOTE — ED Notes (Signed)
Patient has a hole in her right back molar that started apprx a month ago. She has been waiting on her medicaid to come threw. She states no dentist will treat her till her baby is born. Patient states she had a low grade fever last night. She took tylenol to treat it.

## 2012-12-03 NOTE — ED Notes (Signed)
Pt c/o right upper dental pain; pt [redacted] weeks pregnant

## 2012-12-06 NOTE — ED Provider Notes (Signed)
Medical screening examination/treatment/procedure(s) were performed by non-physician practitioner and as supervising physician I was immediately available for consultation/collaboration. Devoria Albe, MD, Armando Gang   Ward Givens, MD 12/06/12 [phone]

## 2012-12-11 LAB — OB RESULTS CONSOLE GBS: GBS: NEGATIVE

## 2012-12-11 LAB — OB RESULTS CONSOLE GC/CHLAMYDIA: Chlamydia: POSITIVE

## 2012-12-17 ENCOUNTER — Emergency Department (HOSPITAL_COMMUNITY)
Admission: EM | Admit: 2012-12-17 | Discharge: 2012-12-18 | Disposition: A | Discharge status: Home / Self Care | Payer: Medicaid Other | Attending: Emergency Medicine | Admitting: Emergency Medicine

## 2012-12-17 ENCOUNTER — Encounter (HOSPITAL_COMMUNITY): Payer: Self-pay | Admitting: *Deleted

## 2012-12-17 DIAGNOSIS — O0930 Supervision of pregnancy with insufficient antenatal care, unspecified trimester: Secondary | ICD-10-CM | POA: Insufficient documentation

## 2012-12-17 DIAGNOSIS — Z8679 Personal history of other diseases of the circulatory system: Secondary | ICD-10-CM | POA: Insufficient documentation

## 2012-12-17 DIAGNOSIS — F17200 Nicotine dependence, unspecified, uncomplicated: Secondary | ICD-10-CM | POA: Insufficient documentation

## 2012-12-17 DIAGNOSIS — K0889 Other specified disorders of teeth and supporting structures: Secondary | ICD-10-CM

## 2012-12-17 DIAGNOSIS — Z331 Pregnant state, incidental: Secondary | ICD-10-CM | POA: Insufficient documentation

## 2012-12-17 DIAGNOSIS — K0381 Cracked tooth: Secondary | ICD-10-CM | POA: Insufficient documentation

## 2012-12-17 DIAGNOSIS — K089 Disorder of teeth and supporting structures, unspecified: Secondary | ICD-10-CM | POA: Insufficient documentation

## 2012-12-17 DIAGNOSIS — F3289 Other specified depressive episodes: Secondary | ICD-10-CM | POA: Insufficient documentation

## 2012-12-17 DIAGNOSIS — IMO0002 Reserved for concepts with insufficient information to code with codable children: Secondary | ICD-10-CM | POA: Insufficient documentation

## 2012-12-17 DIAGNOSIS — O139 Gestational [pregnancy-induced] hypertension without significant proteinuria, unspecified trimester: Secondary | ICD-10-CM | POA: Insufficient documentation

## 2012-12-17 DIAGNOSIS — Z79899 Other long term (current) drug therapy: Secondary | ICD-10-CM | POA: Insufficient documentation

## 2012-12-17 DIAGNOSIS — K029 Dental caries, unspecified: Secondary | ICD-10-CM | POA: Insufficient documentation

## 2012-12-17 DIAGNOSIS — F329 Major depressive disorder, single episode, unspecified: Secondary | ICD-10-CM | POA: Insufficient documentation

## 2012-12-17 DIAGNOSIS — R110 Nausea: Secondary | ICD-10-CM | POA: Insufficient documentation

## 2012-12-17 DIAGNOSIS — D649 Anemia, unspecified: Secondary | ICD-10-CM | POA: Insufficient documentation

## 2012-12-17 DIAGNOSIS — Z792 Long term (current) use of antibiotics: Secondary | ICD-10-CM | POA: Insufficient documentation

## 2012-12-17 DIAGNOSIS — J45909 Unspecified asthma, uncomplicated: Secondary | ICD-10-CM | POA: Insufficient documentation

## 2012-12-17 MED ORDER — LIDOCAINE-EPINEPHRINE (PF) 2 %-1:200000 IJ SOLN
10.0000 mL | Freq: Once | INTRAMUSCULAR | Status: AC
  Administered 2012-12-18: 10 mL

## 2012-12-17 MED ORDER — BUPIVACAINE-EPINEPHRINE PF 0.5-1:200000 % IJ SOLN
1.8000 mL | Freq: Once | INTRAMUSCULAR | Status: AC
  Administered 2012-12-18: 9 mg
  Filled 2012-12-17: qty 1.8

## 2012-12-17 NOTE — ED Provider Notes (Signed)
History  This chart was scribed for non-physician practitioner Renne Crigler, PA-C working with Doug Sou, MD, by Candelaria Stagers, ED Scribe. This patient was seen in room WTR6/WTR6 and the patient's care was started at 11:30 PM   CSN: [id number]  Arrival date & time 12/17/12  2238   First MD Initiated Contact with Patient 12/17/12 2325      Chief Complaint  Patient presents with  . Dental Pain     The history is provided by the patient. No language interpreter was used.   [name] is a 27 y.o. female who presents to the Emergency Department complaining of right lower dental pain that started about two months ago and has gradually worsened.  She reports the tooth is cracked.  She has been seen for the sx and prescribed antibiotics.  Pt is currently pregnant with due date in two weeks.  Pt reports she has called several dentists in the area, but cannot find a dentist who will pull the tooth due to pregnancy.  She has taken tylenol with no relief. The onset of this condition was acute. The course is constant. Aggravating factors: none. Alleviating factors: none.     Past Medical History  Diagnosis Date  . Asthma   . Headache     migraines  . Depression   . PIH (pregnancy induced hypertension)   . Late prenatal care     22wks  . Anemia 2007    Past Surgical History  Procedure Laterality Date  . No past surgeries      Family History  Problem Relation Age of Onset  . Anesthesia problems Neg Hx   . Hypotension Neg Hx   . Malignant hyperthermia Neg Hx   . Pseudochol deficiency Neg Hx   . Mental retardation Maternal Grandmother     History  Substance Use Topics  . Smoking status: Current Every Day Smoker -- 0.50 packs/day    Types: Cigarettes  . Smokeless tobacco: Not on file  . Alcohol Use: No    OB History   Grav Para Term Preterm Abortions TAB SAB Ect Mult Living   5 2 2  0 2 0 2 0 0 2      Review of Systems  Constitutional: Negative for fever.   HENT: Positive for dental problem (right lower dental pain ). Negative for ear pain, sore throat, facial swelling, trouble swallowing and neck pain.   Respiratory: Negative for shortness of breath and stridor.   Gastrointestinal: Positive for nausea.  Skin: Negative for color change.  Neurological: Negative for headaches.    Allergies  Corn syrup  Home Medications   Current Outpatient Rx  Name  Route  Sig  Dispense  Refill  . acetaminophen (TYLENOL) 500 MG tablet   Oral   Take 500 mg by mouth every 6 (six) hours as needed for pain.         Marland Kitchen albuterol (PROVENTIL HFA;VENTOLIN HFA) 108 (90 BASE) MCG/ACT inhaler   Inhalation   Inhale 2 puffs into the lungs every 6 (six) hours as needed. For exercise-induced shortness of breath,rescue inhaler         . amoxicillin (AMOXIL) 500 MG capsule   Oral   Take 500 mg by mouth 3 (three) times daily.         Marland Kitchen azithromycin (ZITHROMAX) 250 MG tablet   Oral   Take 500 mg by mouth once.         . cephALEXin (KEFLEX) 500 MG capsule  Oral   Take 500 mg by mouth 2 (two) times daily.         . diphenhydrAMINE (BENADRYL) 25 mg capsule   Oral   Take 25 mg by mouth every 6 (six) hours as needed for allergies.         . ferrous sulfate 325 (65 FE) MG tablet   Oral   Take 325 mg by mouth 3 (three) times daily with meals.          . hydrocortisone 2.5 % cream   Topical   Apply 1 application topically 2 (two) times daily as needed (for eczema).         Marland Kitchen oxyCODONE-acetaminophen (PERCOCET/ROXICET) 5-325 MG per tablet   Oral   Take 1-2 tablets by mouth every 6 (six) hours as needed for pain.         . Prenatal Vit-Fe Fumarate-FA (MULTIVITAMIN-PRENATAL) 27-0.8 MG TABS   Oral   Take 1 tablet by mouth daily at 12 noon.           BP 110/67  Pulse 94  Temp(Src) 98.3 F (36.8 C) (Oral)  Resp 18  Ht 5\' 8"  (1.727 m)  Wt 173 lb (78.472 kg)  BMI 26.31 kg/m2  SpO2 100%  LMP 03/18/2012  Physical Exam  Nursing note and  vitals reviewed. Constitutional: She appears well-developed and well-nourished. No distress.  HENT:  Head: Normocephalic and atraumatic. No trismus in the jaw.  Right Ear: Tympanic membrane, external ear and ear canal normal.  Left Ear: Tympanic membrane, external ear and ear canal normal.  Nose: Nose normal.  Mouth/Throat: Uvula is midline, oropharynx is clear and moist and mucous membranes are normal. Abnormal dentition. Dental caries present. No dental abscesses or edematous. No tonsillar abscesses.  Broken R mandibular 2nd molar. No swelling or erythema noted on exam.  Eyes: Conjunctivae and EOM are normal.  Neck: Normal range of motion. Neck supple. No tracheal deviation present.  No neck swelling or Ludwig's angina  Cardiovascular: Normal rate.   Pulmonary/Chest: Effort normal. No respiratory distress.  Musculoskeletal: Normal range of motion.  Lymphadenopathy:    She has no cervical adenopathy.  Neurological: She is alert.  Skin: Skin is warm and dry.  Psychiatric: She has a normal mood and affect. Her behavior is normal.    ED Course  Procedures   DIAGNOSTIC STUDIES: Oxygen Saturation is 100% on room air, normal by my interpretation.    COORDINATION OF CARE:  11:33 PM Discussed course of care with pt which includes dental block.  Pt understands and agrees.   Labs Reviewed - No data to display No results found.   1. Pain, dental    Patient seen and examined.    Vital signs reviewed and are as follows: Filed Vitals:   12/18/12 0037  BP: 105/61  Pulse: 87  Temp:   Resp: 18   Dental block was performed. 1.88mL of 2% lidocaine with epi was combined with 1.53mL 0.5% bupivacaine with epi and an alveolar block was performed. Injections made at base of tooth as well as the space immediately posterior and anterior to tooth. Adequate anesthesia was obtained. Minimal bleeding after injections. Patient tolerated procedure well with no immediate complications.      MDM   Patient with toothache.  No gross abscess.  Exam unconcerning for Ludwig's angina or other deep tissue infection in neck.  Dental block/tylenol.  Will not use NSAIDs, narcotics given upcoming birth.  Urged patient to follow-up with dentist.  I personally performed the services described in this documentation, which was scribed in my presence. The recorded information has been reviewed and is accurate.        Renne Crigler, PA-C 12/18/12 2107

## 2012-12-17 NOTE — ED Notes (Signed)
Pt c/o dental pain tp rt side of mouth that began several months ago ; pt states that the pain increased yesterday and has become unable to tolerate.

## 2012-12-21 NOTE — ED Provider Notes (Signed)
Medical screening examination/treatment/procedure(s) were performed by non-physician practitioner and as supervising physician I was immediately available for consultation/collaboration.  Doug Sou, MD 12/21/12 [phone]

## 2013-01-03 ENCOUNTER — Encounter (HOSPITAL_COMMUNITY): Payer: Self-pay | Admitting: *Deleted

## 2013-01-03 ENCOUNTER — Telehealth (HOSPITAL_COMMUNITY): Payer: Self-pay | Admitting: *Deleted

## 2013-01-03 NOTE — Telephone Encounter (Signed)
Preadmission screen  

## 2013-01-04 ENCOUNTER — Encounter (HOSPITAL_COMMUNITY): Payer: Self-pay | Admitting: *Deleted

## 2013-01-04 ENCOUNTER — Inpatient Hospital Stay (HOSPITAL_COMMUNITY)
Admission: AD | Admit: 2013-01-04 | Discharge: 2013-01-04 | Disposition: A | Discharge status: Home / Self Care | Payer: Medicaid Other | Source: Ambulatory Visit | Attending: Obstetrics and Gynecology | Admitting: Obstetrics and Gynecology

## 2013-01-04 DIAGNOSIS — O479 False labor, unspecified: Secondary | ICD-10-CM | POA: Insufficient documentation

## 2013-01-04 MED ORDER — AZITHROMYCIN 250 MG PO TABS
2000.0000 mg | ORAL_TABLET | Freq: Once | ORAL | Status: DC
  Filled 2013-01-04: qty 1

## 2013-01-04 NOTE — MAU Note (Signed)
Patient states she has been leaking clear fluid since 5-21 at 0930 with some irregular contractions. Denies bleeding and reports good fetal movement. Was 3 cm in the office on 5-21, had tested negative for ROM in the office.

## 2013-01-04 NOTE — MAU Note (Signed)
See triage note.

## 2013-01-06 ENCOUNTER — Inpatient Hospital Stay (HOSPITAL_COMMUNITY): Payer: Medicaid Other | Admitting: Anesthesiology

## 2013-01-06 ENCOUNTER — Encounter (HOSPITAL_COMMUNITY): Payer: Self-pay | Admitting: Anesthesiology

## 2013-01-06 ENCOUNTER — Inpatient Hospital Stay (HOSPITAL_COMMUNITY)
Admission: AD | Admit: 2013-01-06 | Discharge: 2013-01-08 | DRG: 774 | Disposition: A | Discharge status: Home / Self Care | Payer: Medicaid Other | Source: Ambulatory Visit | Attending: Obstetrics and Gynecology | Admitting: Obstetrics and Gynecology

## 2013-01-06 ENCOUNTER — Encounter (HOSPITAL_COMMUNITY): Payer: Self-pay | Admitting: *Deleted

## 2013-01-06 DIAGNOSIS — N739 Female pelvic inflammatory disease, unspecified: Secondary | ICD-10-CM | POA: Diagnosis present

## 2013-01-06 DIAGNOSIS — O98319 Other infections with a predominantly sexual mode of transmission complicating pregnancy, unspecified trimester: Secondary | ICD-10-CM | POA: Diagnosis present

## 2013-01-06 DIAGNOSIS — O4290 Premature rupture of membranes, unspecified as to length of time between rupture and onset of labor, unspecified weeks of gestation: Principal | ICD-10-CM | POA: Diagnosis present

## 2013-01-06 DIAGNOSIS — A5619 Other chlamydial genitourinary infection: Secondary | ICD-10-CM | POA: Diagnosis present

## 2013-01-06 LAB — CBC
Hemoglobin: 9.9 g/dL — ABNORMAL LOW (ref 12.0–15.0)
MCH: 29.6 pg (ref 26.0–34.0)
MCHC: 34 g/dL (ref 30.0–36.0)
Platelets: 249 10*3/uL (ref 150–400)
RBC: 3.34 MIL/uL — ABNORMAL LOW (ref 3.87–5.11)

## 2013-01-06 LAB — RPR: RPR Ser Ql: NONREACTIVE

## 2013-01-06 LAB — TYPE AND SCREEN: ABO/RH(D): O POS

## 2013-01-06 MED ORDER — CITRIC ACID-SODIUM CITRATE 334-500 MG/5ML PO SOLN: 30.0000 mL | ORAL | Status: DC | PRN

## 2013-01-06 MED ORDER — EPHEDRINE 5 MG/ML INJ
10.0000 mg | INTRAVENOUS | Status: DC | PRN
  Administered 2013-01-06: 11:00:00 via INTRAVENOUS
  Filled 2013-01-06: qty 2

## 2013-01-06 MED ORDER — PHENYLEPHRINE 40 MCG/ML (10ML) SYRINGE FOR IV PUSH (FOR BLOOD PRESSURE SUPPORT)
80.0000 ug | PREFILLED_SYRINGE | INTRAVENOUS | Status: DC | PRN
  Filled 2013-01-06: qty 2

## 2013-01-06 MED ORDER — TETANUS-DIPHTH-ACELL PERTUSSIS 5-2.5-18.5 LF-MCG/0.5 IM SUSP: 0.5000 mL | Freq: Once | INTRAMUSCULAR | Status: DC

## 2013-01-06 MED ORDER — FENTANYL 2.5 MCG/ML BUPIVACAINE 1/10 % EPIDURAL INFUSION (WH - ANES): 14.0000 mL/h | INTRAMUSCULAR | Status: DC | PRN

## 2013-01-06 MED ORDER — ACETAMINOPHEN 325 MG PO TABS: 650.0000 mg | ORAL_TABLET | ORAL | Status: DC | PRN

## 2013-01-06 MED ORDER — PHENYLEPHRINE 40 MCG/ML (10ML) SYRINGE FOR IV PUSH (FOR BLOOD PRESSURE SUPPORT): 80.0000 ug | PREFILLED_SYRINGE | INTRAVENOUS | Status: DC | PRN

## 2013-01-06 MED ORDER — DIBUCAINE 1 % RE OINT: 1.0000 "application " | TOPICAL_OINTMENT | RECTAL | Status: DC | PRN

## 2013-01-06 MED ORDER — IBUPROFEN 600 MG PO TABS
600.0000 mg | ORAL_TABLET | Freq: Four times a day (QID) | ORAL | Status: DC
  Administered 2013-01-06 – 2013-01-08 (×8): 600 mg via ORAL
  Filled 2013-01-06 (×7): qty 1

## 2013-01-06 MED ORDER — OXYCODONE-ACETAMINOPHEN 5-325 MG PO TABS
1.0000 | ORAL_TABLET | ORAL | Status: DC | PRN
  Administered 2013-01-06 – 2013-01-08 (×10): 1 via ORAL
  Filled 2013-01-06 (×2): qty 1
  Filled 2013-01-06: qty 2
  Filled 2013-01-06 (×7): qty 1

## 2013-01-06 MED ORDER — PRENATAL MULTIVITAMIN CH
1.0000 | ORAL_TABLET | Freq: Every day | ORAL | Status: DC
  Administered 2013-01-07 – 2013-01-08 (×2): 1 via ORAL
  Filled 2013-01-06: qty 1

## 2013-01-06 MED ORDER — TERBUTALINE SULFATE 1 MG/ML IJ SOLN: 0.2500 mg | Freq: Once | INTRAMUSCULAR | Status: DC | PRN

## 2013-01-06 MED ORDER — OXYTOCIN 40 UNITS IN LACTATED RINGERS INFUSION - SIMPLE MED
62.5000 mL/h | INTRAVENOUS | Status: DC
  Administered 2013-01-06: 62.5 mL/h via INTRAVENOUS

## 2013-01-06 MED ORDER — IBUPROFEN 600 MG PO TABS: 600.0000 mg | ORAL_TABLET | Freq: Four times a day (QID) | ORAL | Status: DC | PRN

## 2013-01-06 MED ORDER — LACTATED RINGERS IV SOLN: 500.0000 mL | Freq: Once | INTRAVENOUS | Status: DC

## 2013-01-06 MED ORDER — LIDOCAINE HCL (PF) 1 % IJ SOLN
INTRAMUSCULAR | Status: DC | PRN
  Administered 2013-01-06: 2 mL
  Administered 2013-01-06 (×2): 5 mL

## 2013-01-06 MED ORDER — OXYTOCIN 40 UNITS IN LACTATED RINGERS INFUSION - SIMPLE MED
1.0000 m[IU]/min | INTRAVENOUS | Status: DC
  Administered 2013-01-06: 14 m[IU]/min via INTRAVENOUS
  Administered 2013-01-06: 13 m[IU]/min via INTRAVENOUS
  Administered 2013-01-06: 1 m[IU]/min via INTRAVENOUS
  Administered 2013-01-06: 12 m[IU]/min via INTRAVENOUS
  Administered 2013-01-06: 16 m[IU]/min via INTRAVENOUS
  Administered 2013-01-06: 14 m[IU]/min via INTRAVENOUS
  Administered 2013-01-06: 20 m[IU]/min via INTRAVENOUS
  Filled 2013-01-06: qty 1000

## 2013-01-06 MED ORDER — OXYCODONE-ACETAMINOPHEN 5-325 MG PO TABS
1.0000 | ORAL_TABLET | ORAL | Status: DC | PRN
  Administered 2013-01-06 (×2): 1 via ORAL
  Filled 2013-01-06 (×2): qty 1

## 2013-01-06 MED ORDER — LACTATED RINGERS IV SOLN: INTRAVENOUS | Status: AC

## 2013-01-06 MED ORDER — EPHEDRINE 5 MG/ML INJ: 10.0000 mg | INTRAVENOUS | Status: DC | PRN

## 2013-01-06 MED ORDER — WITCH HAZEL-GLYCERIN EX PADS: 1.0000 "application " | MEDICATED_PAD | CUTANEOUS | Status: DC | PRN

## 2013-01-06 MED ORDER — DIPHENHYDRAMINE HCL 50 MG/ML IJ SOLN: 12.5000 mg | INTRAMUSCULAR | Status: DC | PRN

## 2013-01-06 MED ORDER — ZOLPIDEM TARTRATE 5 MG PO TABS: 5.0000 mg | ORAL_TABLET | Freq: Every evening | ORAL | Status: DC | PRN

## 2013-01-06 MED ORDER — MEASLES, MUMPS & RUBELLA VAC ~~LOC~~ INJ
0.5000 mL | INJECTION | Freq: Once | SUBCUTANEOUS | Status: DC
  Filled 2013-01-06: qty 0.5

## 2013-01-06 MED ORDER — SENNOSIDES-DOCUSATE SODIUM 8.6-50 MG PO TABS
2.0000 | ORAL_TABLET | Freq: Every day | ORAL | Status: DC
  Administered 2013-01-06 – 2013-01-07 (×2): 2 via ORAL

## 2013-01-06 MED ORDER — LIDOCAINE HCL (PF) 1 % IJ SOLN
30.0000 mL | INTRAMUSCULAR | Status: DC | PRN
  Filled 2013-01-06: qty 30

## 2013-01-06 MED ORDER — FENTANYL 2.5 MCG/ML BUPIVACAINE 1/10 % EPIDURAL INFUSION (WH - ANES)
INTRAMUSCULAR | Status: DC | PRN
  Administered 2013-01-06: 14 mL/h via EPIDURAL

## 2013-01-06 MED ORDER — OXYTOCIN 40 UNITS IN LACTATED RINGERS INFUSION - SIMPLE MED: 62.5000 mL/h | INTRAVENOUS | Status: AC | PRN

## 2013-01-06 MED ORDER — LANOLIN HYDROUS EX OINT: TOPICAL_OINTMENT | CUTANEOUS | Status: DC | PRN

## 2013-01-06 MED ORDER — OXYTOCIN BOLUS FROM INFUSION: 500.0000 mL | INTRAVENOUS | Status: DC

## 2013-01-06 MED ORDER — ONDANSETRON HCL 4 MG PO TABS: 4.0000 mg | ORAL_TABLET | ORAL | Status: DC | PRN

## 2013-01-06 MED ORDER — FLEET ENEMA 7-19 GM/118ML RE ENEM: 1.0000 | ENEMA | RECTAL | Status: DC | PRN

## 2013-01-06 MED ORDER — LACTATED RINGERS IV SOLN
500.0000 mL | Freq: Once | INTRAVENOUS | Status: AC
  Administered 2013-01-06: 500 mL via INTRAVENOUS

## 2013-01-06 MED ORDER — ONDANSETRON HCL 4 MG/2ML IJ SOLN
4.0000 mg | Freq: Four times a day (QID) | INTRAMUSCULAR | Status: DC | PRN
  Administered 2013-01-06: 4 mg via INTRAVENOUS
  Filled 2013-01-06: qty 2

## 2013-01-06 MED ORDER — SIMETHICONE 80 MG PO CHEW: 80.0000 mg | CHEWABLE_TABLET | ORAL | Status: DC | PRN

## 2013-01-06 MED ORDER — ONDANSETRON HCL 4 MG/2ML IJ SOLN: 4.0000 mg | INTRAMUSCULAR | Status: DC | PRN

## 2013-01-06 MED ORDER — FENTANYL 2.5 MCG/ML BUPIVACAINE 1/10 % EPIDURAL INFUSION (WH - ANES)
14.0000 mL/h | INTRAMUSCULAR | Status: DC | PRN
  Administered 2013-01-06: 14 mL/h via EPIDURAL
  Filled 2013-01-06 (×2): qty 125

## 2013-01-06 MED ORDER — LACTATED RINGERS IV SOLN
500.0000 mL | INTRAVENOUS | Status: DC | PRN
  Administered 2013-01-06 (×2): 500 mL via INTRAVENOUS

## 2013-01-06 MED ORDER — EPHEDRINE 5 MG/ML INJ
10.0000 mg | INTRAVENOUS | Status: DC | PRN
  Filled 2013-01-06: qty 4
  Filled 2013-01-06: qty 2

## 2013-01-06 MED ORDER — PHENYLEPHRINE 40 MCG/ML (10ML) SYRINGE FOR IV PUSH (FOR BLOOD PRESSURE SUPPORT)
80.0000 ug | PREFILLED_SYRINGE | INTRAVENOUS | Status: DC | PRN
  Filled 2013-01-06: qty 2
  Filled 2013-01-06: qty 5

## 2013-01-06 MED ORDER — LACTATED RINGERS IV SOLN
INTRAVENOUS | Status: DC
  Administered 2013-01-06: 14:00:00 via INTRAVENOUS
  Administered 2013-01-06: 125 mL/h via INTRAVENOUS
  Administered 2013-01-06 (×2): via INTRAVENOUS

## 2013-01-06 MED ORDER — BENZOCAINE-MENTHOL 20-0.5 % EX AERO
1.0000 "application " | INHALATION_SPRAY | CUTANEOUS | Status: DC | PRN
  Administered 2013-01-06: 1 via TOPICAL
  Filled 2013-01-06: qty 56

## 2013-01-06 MED ORDER — DIPHENHYDRAMINE HCL 25 MG PO CAPS: 25.0000 mg | ORAL_CAPSULE | Freq: Four times a day (QID) | ORAL | Status: DC | PRN

## 2013-01-06 NOTE — MAU Note (Signed)
SROM at 2330 with clear fld and contractions started soon afterward

## 2013-01-06 NOTE — Progress Notes (Signed)
Patient ID: [name], female   DOB: Jan 08, 1986, 27 y.o.   MRN: [id number] Pitocin at 24 mu/ minute and the contractions are q 2-3 minutes. The FHR looks normal The cervix is 4-5 cm 60 % effaced and the vertex is at - 2 station.

## 2013-01-06 NOTE — Progress Notes (Signed)
Patient ID: [name], female   DOB: [birth year], 27 y.o.   MRN: [id number] Pt has begun to feel pressure and the cervix is 7 cm 80% effaced and the vertex is at - 1 station.

## 2013-01-06 NOTE — Progress Notes (Signed)
Report called to [facility] in [facility]. Pt to 162 via w/c from Triage

## 2013-01-06 NOTE — Progress Notes (Signed)
Patient ID: [name], female   DOB: Jan 30, 1986, 27 y.o.   MRN: [id number] Pt has an epidural and is comfortable The pitocin is at 12 mu/inute and the cervix is 4 cm 50 % effaced and the vertex is at - 3 station.

## 2013-01-06 NOTE — Anesthesia Preprocedure Evaluation (Signed)
Anesthesia Evaluation  Patient identified by MRN, date of birth, ID band Patient awake    Reviewed: Allergy & Precautions, H&P , NPO status , Patient's Chart, lab work & pertinent test results  Airway Mallampati: II TM Distance: >3 FB Neck ROM: full    Dental no notable dental hx.    Pulmonary asthma ,    Pulmonary exam normal       Cardiovascular negative cardio ROS      Neuro/Psych PSYCHIATRIC DISORDERS Depression    GI/Hepatic negative GI ROS, Neg liver ROS,   Endo/Other  negative endocrine ROS  Renal/GU negative Renal ROS  negative genitourinary   Musculoskeletal negative musculoskeletal ROS (+)   Abdominal Normal abdominal exam  (+)   Peds negative pediatric ROS (+)  Hematology negative hematology ROS (+)   Anesthesia Other Findings   Reproductive/Obstetrics (+) Pregnancy                           Anesthesia Physical  Anesthesia Plan  ASA: II  Anesthesia Plan: Epidural   Post-op Pain Management:    Induction:   Airway Management Planned:   Additional Equipment:   Intra-op Plan:   Post-operative Plan:   Informed Consent: I have reviewed the patients History and Physical, chart, labs and discussed the procedure including the risks, benefits and alternatives for the proposed anesthesia with the patient or authorized representative who has indicated his/her understanding and acceptance.     Plan Discussed with:   Anesthesia Plan Comments:         Anesthesia Quick Evaluation

## 2013-01-06 NOTE — Progress Notes (Signed)
Patient ID: [name], female   DOB: 1985/09/19, 27 y.o.   MRN: [id number] Cervix 8 cm fully effaced and the vertex is at -1/0 station.

## 2013-01-06 NOTE — Progress Notes (Signed)
Patient ID: [name], female   DOB: 1986-02-17, 27 y.o.   MRN: [id number] Delivery note:  The pt became fully dilated and pushed with 2 contractions to deliver a living female infant loa OVER AN INTACT PERINEUM. The Apgars were 9 and 9 at 1 and 5 minutes. The placenta delivered intact and the uterus was normal. There was some blood and bloody fluid with the placenta. There were no lacerations. EBL 400 cc's.

## 2013-01-06 NOTE — H&P (Signed)
Mackenzie, [name] NO.:  [id number]  MEDICAL RECORD NO.:  [id number]  LOCATION:  9162                          FACILITY:  [facility]  PHYSICIAN:  Malachi Pro. Ambrose Mantle, M.D. DATE OF BIRTH:  [birth year]  DATE OF ADMISSION:  01/06/2013 DATE OF DISCHARGE:                             HISTORY & PHYSICAL   PRESENT ILLNESS:  This is a 27 year old white female, para 2-0-1-2, gravida 4, EDC Jan 11, 2013, who is admitted with premature rupture of membranes.  Blood group and type O positive, rubella immune, RPR nonreactive.  Urine culture positive for E. coli, treated with Keflex. Hepatitis B surface antigen negative, HIV negative, GC negative, chlamydia positive.  This was on November 10, 2012, but the patient apparently did not take treatment until the last few days.  One-hour Glucola 115, group B strep negative.  The patient began her prenatal care in our office at about [redacted] weeks gestation.  There is some confusion as to how many times the patient was treated with Zithromax, but she admits to me that she was treated with Zithromax in the last week for the positive Chlamydia.  PAST MEDICAL HISTORY:  Reveals eczema, depression in 2007 treated with medications but it was not helpful.  She had asthma at age 38 that was exercise induced.  She has had recurrent urinary tract infections.  OBSTETRIC HISTORY:  In 2008 and 2012, she delivered full-term infants vaginally.  In 2011, she had a spontaneous abortion.  SOCIAL HISTORY:  Alcohol occasional.  Smoking half a pack a day.  She denies illicit drugs.  ALLERGIES:  She gives a history of no known drug allergies.  PRENATAL VISITS:  On March 28, ultrasound showed normal anatomy and growth.  On April 4, she had not taken treatment for the urinary tract infection.  She had an appointment to see a dentist in 4 days for an abscess tooth.  On April 11, she claimed that she was quitting smoking, still had abscessed tooth and had not had any  luck with the dentist seeing her.  On April 17, still not able to see the dentist.  On April 28, she was found to have Chlamydia, was prescribed 1 g of erythromycin. The patient was called on multiple occasions, unable to reach the patient.  On May 14, prescription for the azithromycin was sent to the pharmacy.  On the day of admission, she had spontaneous rupture of membranes and came to the hospital for evaluation where rupture of membranes was confirmed.  She was admitted and placed on Pitocin.  PHYSICAL EXAMINATION:  VITAL SIGNS:  On admission, temperature 97.6, pulse 66, respirations 18, blood pressure 102/62. HEART:  Normal size and sounds.  No murmurs. LUNGS:  Clear to auscultation. ABDOMEN:  Fundal height appropriate for gestational age.  Per the nurse's exam, she was 3 cm, 50%, vertex at a -2.  ADMITTING IMPRESSION:  Intrauterine pregnancy at 39+ weeks with premature rupture of the membranes.  The patient is on Pitocin.  Pitocin is presently at 9 milliunits a minute.  Contractions every 3-4 minutes. She has requested and is currently receiving an epidural.     Malachi Pro. Ambrose Mantle, M.D.  TFH/MEDQ  D:  01/06/2013  T:  01/06/2013  Job:  [id number]

## 2013-01-06 NOTE — Anesthesia Procedure Notes (Signed)
Epidural Patient location during procedure: holding area  Staffing Anesthesiologist: Phillips Grout Performed by: anesthesiologist   Preanesthetic Checklist Completed: patient identified, site marked, surgical consent, pre-op evaluation, timeout performed, IV checked, risks and benefits discussed, monitors and equipment checked and post-op pain management  Epidural Patient position: sitting Prep: Betadine Patient monitoring: heart rate, continuous pulse ox and blood pressure Approach: midline  Needle:  Needle type: Hustead  Needle gauge: 18 G Needle length: 9 cm and 9 Needle insertion depth: 7 cm Catheter type: closed end flexible Catheter size: 20 Guage Catheter at skin depth: 12 cm Test dose: negative and 1.5% lidocaine  Additional Notes Test dose 1.5% Lidocaine with epi 1:200,000  Patient tolerated the insertion well without complications.Reason for block:post-op pain management

## 2013-01-07 LAB — CBC
HCT: 29.9 % — ABNORMAL LOW (ref 36.0–46.0)
Hemoglobin: 10.1 g/dL — ABNORMAL LOW (ref 12.0–15.0)
MCHC: 33.8 g/dL (ref 30.0–36.0)
WBC: 12.4 10*3/uL — ABNORMAL HIGH (ref 4.0–10.5)

## 2013-01-07 NOTE — Clinical Social Work Note (Signed)
Clinical Social Work Department PSYCHOSOCIAL ASSESSMENT - MATERNAL/CHILD 01/07/2013  Patient:  [name],[name]  Account Number:  [id number]  Admit Date:  01/06/2013  Childs Name:   Roaryn Cody Gage    Clinical Social Worker:  Aradhya Shellenbarger, LCSW   Date/Time:  01/07/2013 02:30 PM  Date Referred:  01/07/2013   Referral source  Physician     Referred reason  LPNC  Depression/Anxiety   Other referral source:    I:  FAMILY / HOME ENVIRONMENT Child's legal guardian:  PARENT  Guardian - Name Guardian - Age Guardian - Address  [name] [address] [place], [place] [address]  [name]  [address] [place], [place] [address]   Other household support members/support persons Name Relationship DOB  5 1/2 yo SISTER   1 1/2 SISTER    Other support:   MOB and FOB report good family support    II  PSYCHOSOCIAL DATA Information Source:  Patient Interview  Financial and Community Resources Employment:   MOB: [employer]  FOB: [employer]   Financial resources:  Medicaid If Medicaid - County:  [place] Other  WIC  Food Stamps   School / Grade:   Maternity Care Coordinator / Child Services Coordination / Early Interventions:  Cultural issues impacting care:    III  STRENGTHS Strengths  Adequate Resources  Home prepared for Child (including basic supplies)  Supportive family/friends   Strength comment:    IV  RISK FACTORS AND CURRENT PROBLEMS Current Problem:  None   Risk Factor & Current Problem Patient Issue Family Issue Risk Factor / Current Problem Comment   N N     V  SOCIAL WORK ASSESSMENT CSW spoke with MOB at bedside.  CSW discussed emotional stability and hx of MH issues.  MOB reports no MH hx and no current concerns and expressed she will let RN or CSW know if any emotional concerns arise. CSW discussed LPNC.  MOB reports having issues with medicaid getting processed and unable to get a doctor. MOB reports no current concerns with medicaid.  MOB also  reports looking into WIC. CSW discussed hospital policy to drug screen and MOB was understanding.  UDS currenlty negative, awaiting UDS and MEC results.  MOB reports no concerns with supplies or family support.  No further concerns at this time and no barriers to discharge.  Please reconsult CSW if further needs arise.      VI SOCIAL WORK PLAN Social Work Plan  No Further Intervention Required / No Barriers to Discharge   Type of pt/family education:   If child protective services report - county:   If child protective services report - date:   Information/referral to community resources comment:   Other social work plan:    

## 2013-01-07 NOTE — Progress Notes (Signed)
Patient ID: [name], female   DOB: 1986/01/08, 27 y.o.   MRN: [id number] #1 afebrile no problems

## 2013-01-07 NOTE — Lactation Note (Signed)
This note was copied from the chart of Mackenzie [place]. Lactation Consultation Note  Patient Name: [name] Today's Date: 01/07/2013 Reason for consult: Initial assessment   Maternal Data Formula Feeding for Exclusion: No Infant to breast within first hour of birth: Yes Does the patient have breastfeeding experience prior to this delivery?: Yes  Feeding    LATCH Score/Interventions                      Lactation Tools Discussed/Used     Consult Status Consult Status: PRN  Experienced BF mom reports that baby has been nursing well about every 2 hours.. No questions at present. BF n=brochure given with resources for support after DC. To call for assist prn  [name] 01/07/2013, 1:55 PM

## 2013-01-08 ENCOUNTER — Encounter (HOSPITAL_COMMUNITY): Payer: Self-pay | Admitting: Obstetrics and Gynecology

## 2013-01-08 MED ORDER — PNEUMOCOCCAL VAC POLYVALENT 25 MCG/0.5ML IJ INJ
0.5000 mL | INJECTION | INTRAMUSCULAR | Status: DC
  Filled 2013-01-08: qty 0.5

## 2013-01-08 MED ORDER — RHO D IMMUNE GLOBULIN 1500 UNIT/2ML IJ SOLN
300.0000 ug | Freq: Once | INTRAMUSCULAR | Status: DC
  Filled 2013-01-08: qty 2

## 2013-01-08 MED ORDER — OXYCODONE-ACETAMINOPHEN 5-325 MG PO TABS: 1.0000 | ORAL_TABLET | Freq: Four times a day (QID) | ORAL | Status: DC | PRN

## 2013-01-08 MED ORDER — IBUPROFEN 800 MG PO TABS: 800.0000 mg | ORAL_TABLET | Freq: Three times a day (TID) | ORAL | Status: DC | PRN

## 2013-01-08 MED ORDER — PRENATAL 27-0.8 MG PO TABS: 1.0000 | ORAL_TABLET | Freq: Every day | ORAL | Status: DC

## 2013-01-08 NOTE — Discharge Summary (Signed)
Obstetric Discharge Summary Reason for Admission: rupture of membranes, recent untreated Chlamydial infection Prenatal Procedures: none Intrapartum Procedures: spontaneous vaginal delivery Postpartum Procedures: none Complications-Operative and Postpartum: none Hemoglobin  Date Value Range Status  01/07/2013 10.1* 12.0 - 15.0 g/dL Final     HCT  Date Value Range Status  01/07/2013 29.9* 36.0 - 46.0 % Final    Physical Exam:  General: alert and no distress Lochia: appropriate Uterine Fundus: firm  Discharge Diagnoses: Term Pregnancy-delivered  Discharge Information: Date: 01/08/2013 Activity: pelvic rest Diet: routine Medications: PNV, Ibuprofen and Percocet Condition: stable Instructions: refer to practice specific booklet Discharge to: home Follow-up Information   Follow up with Bing Plume, MD. Schedule an appointment as soon as possible for a visit in 6 weeks.   Contact information:   [address], [address], [address] [place] [address] [phone]       Newborn Data: Live born female  Birth Weight: 7 lb 12.5 oz (3530 g) APGAR: 9, 9  Home with mother.  BOVARD,Kreed Kauffman 01/08/2013, 8:55 AM

## 2013-01-08 NOTE — Progress Notes (Signed)
Post Partum Day 2 Subjective: no complaints, up ad lib, voiding, tolerating PO and nl lochia, pain controlled  Objective: Blood pressure 103/59, pulse 67, temperature 97.4 F (36.3 C), temperature source Oral, resp. rate 18, height 5\' 8"  (1.727 m), weight 78.019 kg (172 lb), last menstrual period 03/18/2012, SpO2 99.00%, unknown if currently breastfeeding.  Physical Exam:  General: alert and no distress Lochia: appropriate Uterine Fundus: firm   Recent Labs  01/06/13 0323 01/07/13 0608  HGB 9.9* 10.1*  HCT 29.1* 29.9*    Assessment/Plan: Discharge home.  D/c with motrin, percocet, pnv.  F/u 6 weeks.  Breastfeeding/lactation consult    LOS: 2 days   [name] 01/08/2013, 8:21 AM

## 2013-01-08 NOTE — Progress Notes (Signed)
UR chart review completed.  

## 2013-01-08 NOTE — Anesthesia Postprocedure Evaluation (Signed)
Anesthesia Post Note  Patient: [name]  Procedure(s) Performed: * No procedures listed *  Anesthesia type: Epidural  Patient location: Mother/Baby  Post pain: Pain level controlled  Post assessment: Post-op Vital signs reviewed  Last Vitals:  Filed Vitals:   01/08/13 0530  BP: 103/59  Pulse: 67  Temp: 36.3 C  Resp: 18    Post vital signs: Reviewed  Level of consciousness: awake  Complications: No apparent anesthesia complications

## 2013-01-12 ENCOUNTER — Inpatient Hospital Stay (HOSPITAL_COMMUNITY): Admission: RE | Admit: 2013-01-12 | Payer: Medicaid Other | Source: Ambulatory Visit

## 2013-01-13 ENCOUNTER — Encounter (HOSPITAL_COMMUNITY): Payer: Self-pay | Admitting: Family

## 2013-01-13 ENCOUNTER — Inpatient Hospital Stay (HOSPITAL_COMMUNITY)
Admission: AD | Admit: 2013-01-13 | Discharge: 2013-01-13 | Disposition: A | Discharge status: Home / Self Care | Payer: Medicaid Other | Source: Ambulatory Visit | Attending: Obstetrics and Gynecology | Admitting: Obstetrics and Gynecology

## 2013-01-13 DIAGNOSIS — G43909 Migraine, unspecified, not intractable, without status migrainosus: Secondary | ICD-10-CM | POA: Insufficient documentation

## 2013-01-13 DIAGNOSIS — M5430 Sciatica, unspecified side: Secondary | ICD-10-CM

## 2013-01-13 DIAGNOSIS — K047 Periapical abscess without sinus: Secondary | ICD-10-CM | POA: Insufficient documentation

## 2013-01-13 DIAGNOSIS — M5431 Sciatica, right side: Secondary | ICD-10-CM

## 2013-01-13 LAB — URINE MICROSCOPIC-ADD ON

## 2013-01-13 LAB — CBC
HCT: 29.5 % — ABNORMAL LOW (ref 36.0–46.0)
Platelets: 312 10*3/uL (ref 150–400)
RBC: 3.35 MIL/uL — ABNORMAL LOW (ref 3.87–5.11)
RDW: 13.3 % (ref 11.5–15.5)
WBC: 9.7 10*3/uL (ref 4.0–10.5)

## 2013-01-13 LAB — URINALYSIS, ROUTINE W REFLEX MICROSCOPIC
Glucose, UA: NEGATIVE mg/dL
Protein, ur: NEGATIVE mg/dL
Specific Gravity, Urine: 1.02 (ref 1.005–1.030)
Urobilinogen, UA: 1 mg/dL (ref 0.0–1.0)

## 2013-01-13 MED ORDER — OXYCODONE-ACETAMINOPHEN 5-325 MG PO TABS: 1.0000 | ORAL_TABLET | ORAL | Status: DC | PRN

## 2013-01-13 MED ORDER — BUTALBITAL-APAP-CAFFEINE 50-325-40 MG PO TABS
2.0000 | ORAL_TABLET | Freq: Once | ORAL | Status: AC
  Administered 2013-01-13: 2 via ORAL
  Filled 2013-01-13: qty 2

## 2013-01-13 NOTE — MAU Provider Note (Signed)
History     CSN: [id number]  Arrival date and time: 01/13/13 1635   First Provider Initiated Contact with Patient 01/13/13 1708      Chief Complaint  Patient presents with  . Postpartum Complications   HPI This is a 27 y.o. female who is about one week postpartum who presents with c/o heavy bleeding for the past two days. Has not changed her activity level. States soaks through 4 maxipads per hour. Also c/o headache.  Thinks it is migraine. Has been having them for a while now, related to tooth abscess (has dental appt next week).  No fever  RN Note Patient presents to MAU with c/o postpartum vaginal bleeding; vaginal delivery on 5/24. Denies clots; reports bleeding subsided until today when she saturated 4 pads between 1230-1400. Reports suprapubic pain and lower back pain 7/10, progressively worse since pain began yesterday. Denies sexual intercourse or trauma. Reports she has taken ibuprofen (1530) and tylenol (1000) today, with no relief.  Patient breast fed last at 1600 today.       OB History   Grav Para Term Preterm Abortions TAB SAB Ect Mult Living   4 3 3  0 1 0 1 0 0 3      Past Medical History  Diagnosis Date  . Asthma   . Headache(784.0)     migraines  . Depression   . PIH (pregnancy induced hypertension)   . Late prenatal care     22wks  . Anemia 2007  . HPV (human papilloma virus) anogenital infection   . Recurrent UTI   . Abnormal Pap smear   . Chlamydia infection     has no taken treatment as of 01/03/2013  . SVD (spontaneous vaginal delivery) 01/08/2013    Past Surgical History  Procedure Laterality Date  . No past surgeries      Family History  Problem Relation Age of Onset  . Anesthesia problems Neg Hx   . Hypotension Neg Hx   . Malignant hyperthermia Neg Hx   . Pseudochol deficiency Neg Hx   . Heart disease Maternal Grandmother     clots in heart  . Stroke Maternal Grandmother   . Diabetes Mother   . Heart disease Maternal Grandfather      pacemaker  . Miscarriages / Stillbirths Paternal Aunt   . Stroke Paternal Grandmother     History  Substance Use Topics  . Smoking status: Current Every Day Smoker -- 0.25 packs/day for 5 years    Types: Cigarettes  . Smokeless tobacco: Never Used  . Alcohol Use: No    Allergies:  Allergies  Allergen Reactions  . Corn Syrup Anaphylaxis and Hives    Prescriptions prior to admission  Medication Sig Dispense Refill  . acetaminophen (TYLENOL) 500 MG tablet Take 500 mg by mouth every 6 (six) hours as needed for pain.      Marland Kitchen ibuprofen (ADVIL,MOTRIN) 800 MG tablet Take 1 tablet (800 mg total) by mouth every 8 (eight) hours as needed for pain.  45 tablet  1  . oxyCODONE-acetaminophen (PERCOCET/ROXICET) 5-325 MG per tablet Take 1-2 tablets by mouth every 6 (six) hours as needed for pain.  15 tablet  0  . Prenatal Vit-Fe Fumarate-FA (MULTIVITAMIN-PRENATAL) 27-0.8 MG TABS Take 1 tablet by mouth daily at 12 noon.  30 each  12    Review of Systems  Constitutional: Positive for fever and malaise/fatigue. Negative for chills.  Gastrointestinal: Positive for abdominal pain. Negative for nausea, vomiting, diarrhea and constipation.  Genitourinary: Negative for dysuria.       Soaking 4 pads per hour  Musculoskeletal: Negative for myalgias.  Neurological: Positive for weakness and headaches. Negative for dizziness.   Physical Exam   Last menstrual period 03/18/2012, currently breastfeeding.  Physical Exam  Constitutional: She is oriented to person, place, and time. She appears well-developed and well-nourished. No distress.  HENT:  Head: Normocephalic.  Cardiovascular: Normal rate.   Respiratory: Effort normal.  GI: Soft. She exhibits no distension and no mass. There is tenderness (mild to moderate tenderness over uterus). There is no rebound and no guarding.  Genitourinary: Uterus normal. Vaginal discharge (small to moderate blood) found.  Musculoskeletal: Normal range of motion.   Neurological: She is alert and oriented to person, place, and time.  Skin: Skin is warm and dry.  Psychiatric: She has a normal mood and affect.   Results for orders placed during the hospital encounter of 01/13/13 (from the past 24 hour(s))  URINALYSIS, ROUTINE W REFLEX MICROSCOPIC     Status: Abnormal   Collection Time    01/13/13  5:03 PM      Result Value Range   Color, Urine YELLOW  YELLOW   APPearance CLEAR  CLEAR   Specific Gravity, Urine 1.020  1.005 - 1.030   pH 6.5  5.0 - 8.0   Glucose, UA NEGATIVE  NEGATIVE mg/dL   Hgb urine dipstick LARGE (*) NEGATIVE   Bilirubin Urine NEGATIVE  NEGATIVE   Ketones, ur 15 (*) NEGATIVE mg/dL   Protein, ur NEGATIVE  NEGATIVE mg/dL   Urobilinogen, UA 1.0  0.0 - 1.0 mg/dL   Nitrite NEGATIVE  NEGATIVE   Leukocytes, UA SMALL (*) NEGATIVE  URINE MICROSCOPIC-ADD ON     Status: Abnormal   Collection Time    01/13/13  5:03 PM      Result Value Range   Squamous Epithelial / LPF RARE  RARE   WBC, UA 3-6  <3 WBC/hpf   RBC / HPF 7-10  <3 RBC/hpf   Bacteria, UA FEW (*) RARE  CBC     Status: Abnormal   Collection Time    01/13/13  5:21 PM      Result Value Range   WBC 9.7  4.0 - 10.5 K/uL   RBC 3.35 (*) 3.87 - 5.11 MIL/uL   Hemoglobin 9.9 (*) 12.0 - 15.0 g/dL   HCT 19.1 (*) 47.8 - 29.5 %   MCV 88.1  78.0 - 100.0 fL   MCH 29.6  26.0 - 34.0 pg   MCHC 33.6  30.0 - 36.0 g/dL   RDW 62.1  30.8 - 65.7 %   Platelets 312  150 - 400 K/uL   Headache improved with Fioricet  MAU Course  Procedures  Assessment and Plan  A:  Postpartum x 1 week      Postpartum lochia, with normal Hgb (was 10.1 a week ago)      Migraine exacerbated by wisdom tooth abscess  P:  Discussed with DR Meisinger       Keep Dental appt for next week       Watch bleeding and call office Monday to see if reevaluation is necessary       Rx Percocet until she can get tooth pulled        [facility] 01/13/2013, 5:19 PM

## 2013-01-13 NOTE — MAU Note (Signed)
Post partum 5/24. Pt c/o migraines, back pain and increased vaginal bleeding in the past 2 days changing pad every 1-1.5 hrs. Pt is breast feeding.

## 2013-01-13 NOTE — MAU Note (Signed)
Patient presents to MAU with c/o postpartum vaginal bleeding; vaginal delivery on 5/24. Denies clots; reports bleeding subsided until today when she saturated 4 pads between 1230-1400. Reports suprapubic pain and lower back pain 7/10, progressively worse since pain began yesterday. Denies sexual intercourse or trauma. Reports she has taken ibuprofen (1530) and tylenol (1000) today, with no relief.  Patient breast fed last at 1600 today.

## 2013-01-14 LAB — URINE CULTURE

## 2013-01-25 ENCOUNTER — Emergency Department (HOSPITAL_COMMUNITY)
Admission: EM | Admit: 2013-01-25 | Discharge: 2013-01-25 | Disposition: A | Discharge status: Home / Self Care | Payer: Medicaid Other | Attending: Emergency Medicine | Admitting: Emergency Medicine

## 2013-01-25 ENCOUNTER — Encounter (HOSPITAL_COMMUNITY): Payer: Self-pay | Admitting: *Deleted

## 2013-01-25 DIAGNOSIS — D649 Anemia, unspecified: Secondary | ICD-10-CM | POA: Insufficient documentation

## 2013-01-25 DIAGNOSIS — F17200 Nicotine dependence, unspecified, uncomplicated: Secondary | ICD-10-CM | POA: Insufficient documentation

## 2013-01-25 DIAGNOSIS — K0889 Other specified disorders of teeth and supporting structures: Secondary | ICD-10-CM

## 2013-01-25 DIAGNOSIS — Z7901 Long term (current) use of anticoagulants: Secondary | ICD-10-CM | POA: Insufficient documentation

## 2013-01-25 DIAGNOSIS — Z8619 Personal history of other infectious and parasitic diseases: Secondary | ICD-10-CM | POA: Insufficient documentation

## 2013-01-25 DIAGNOSIS — J45909 Unspecified asthma, uncomplicated: Secondary | ICD-10-CM | POA: Insufficient documentation

## 2013-01-25 DIAGNOSIS — K089 Disorder of teeth and supporting structures, unspecified: Secondary | ICD-10-CM | POA: Insufficient documentation

## 2013-01-25 DIAGNOSIS — Z87440 Personal history of urinary (tract) infections: Secondary | ICD-10-CM | POA: Insufficient documentation

## 2013-01-25 DIAGNOSIS — Z79899 Other long term (current) drug therapy: Secondary | ICD-10-CM | POA: Insufficient documentation

## 2013-01-25 DIAGNOSIS — Z8742 Personal history of other diseases of the female genital tract: Secondary | ICD-10-CM | POA: Insufficient documentation

## 2013-01-25 DIAGNOSIS — O0930 Supervision of pregnancy with insufficient antenatal care, unspecified trimester: Secondary | ICD-10-CM | POA: Insufficient documentation

## 2013-01-25 DIAGNOSIS — F3289 Other specified depressive episodes: Secondary | ICD-10-CM | POA: Insufficient documentation

## 2013-01-25 DIAGNOSIS — R110 Nausea: Secondary | ICD-10-CM | POA: Insufficient documentation

## 2013-01-25 DIAGNOSIS — J029 Acute pharyngitis, unspecified: Secondary | ICD-10-CM | POA: Insufficient documentation

## 2013-01-25 DIAGNOSIS — F329 Major depressive disorder, single episode, unspecified: Secondary | ICD-10-CM | POA: Insufficient documentation

## 2013-01-25 MED ORDER — AMOXICILLIN 500 MG PO CAPS: 500.0000 mg | ORAL_CAPSULE | Freq: Two times a day (BID) | ORAL | Status: DC

## 2013-01-25 MED ORDER — OXYCODONE-ACETAMINOPHEN 5-325 MG PO TABS
2.0000 | ORAL_TABLET | Freq: Once | ORAL | Status: AC
  Administered 2013-01-25: 2 via ORAL
  Filled 2013-01-25: qty 2

## 2013-01-25 MED ORDER — OXYCODONE-ACETAMINOPHEN 5-325 MG PO TABS: 2.0000 | ORAL_TABLET | ORAL | Status: DC | PRN

## 2013-01-25 NOTE — ED Provider Notes (Signed)
Medical screening examination/treatment/procedure(s) were performed by non-physician practitioner and as supervising physician I was immediately available for consultation/collaboration.   Tanayah Squitieri H Gracielynn Birkel, MD 01/25/13 2323 

## 2013-01-25 NOTE — ED Provider Notes (Signed)
History  This chart was scribed for non-physician practitioner Arthor Captain, PA-C working with Richardean Canal, MD, by Candelaria Stagers, ED Scribe. This patient was seen in room WTR8/WTR8 and the patient's care was started at 4:02 PM   CSN: [id number]  Arrival date & time 01/25/13  1454   First MD Initiated Contact with Patient 01/25/13 1523      Chief Complaint  Patient presents with  . Dental Pain  . Sore Throat     The history is provided by the patient. No language interpreter was used.   HPI Comments: [name] is a 27 y.o. female who presents to the Emergency Department complaining of worsening dental pain that started about six months ago associated with a cavity to the right lower molar.  Pt has been experiencing nausea that started today.  She denies fever, chills, difficulty breathing or swallowing.  Pt reports the tooth has started breaking off over the last few weeks.  Pt recently gave birth and reports she was not able to address the dental pain due to pregnancy.  Pt reports she has a dentist appointment at the end of the month.        Past Medical History  Diagnosis Date  . Asthma   . Headache(784.0)     migraines  . Depression   . PIH (pregnancy induced hypertension)   . Late prenatal care     22wks  . Anemia 2007  . HPV (human papilloma virus) anogenital infection   . Recurrent UTI   . Abnormal Pap smear   . Chlamydia infection     has no taken treatment as of 01/03/2013  . SVD (spontaneous vaginal delivery) 01/08/2013    Past Surgical History  Procedure Laterality Date  . No past surgeries      Family History  Problem Relation Age of Onset  . Anesthesia problems Neg Hx   . Hypotension Neg Hx   . Malignant hyperthermia Neg Hx   . Pseudochol deficiency Neg Hx   . Heart disease Maternal Grandmother     clots in heart  . Stroke Maternal Grandmother   . Diabetes Mother   . Heart disease Maternal Grandfather     pacemaker  . Miscarriages /  Stillbirths Paternal Aunt   . Stroke Paternal Grandmother     History  Substance Use Topics  . Smoking status: Current Every Day Smoker -- 0.25 packs/day for 5 years    Types: Cigarettes  . Smokeless tobacco: Never Used  . Alcohol Use: No    OB History   Grav Para Term Preterm Abortions TAB SAB Ect Mult Living   4 3 3  0 1 0 1 0 0 3      Review of Systems  HENT: Positive for sore throat and dental problem.   Gastrointestinal: Positive for nausea.  All other systems reviewed and are negative.    Allergies  Corn syrup  Home Medications   Current Outpatient Rx  Name  Route  Sig  Dispense  Refill  . ibuprofen (ADVIL,MOTRIN) 800 MG tablet   Oral   Take 1 tablet (800 mg total) by mouth every 8 (eight) hours as needed for pain.   45 tablet   1   . oxyCODONE-acetaminophen (ROXICET) 5-325 MG per tablet   Oral   Take 1 tablet by mouth every 4 (four) hours as needed for pain.   30 tablet   0   . Prenatal Vit-Fe Fumarate-FA (MULTIVITAMIN-PRENATAL) 27-0.8 MG TABS  Oral   Take 1 tablet by mouth daily at 12 noon.   30 each   12     There were no vitals taken for this visit.  Physical Exam  Nursing note and vitals reviewed. Constitutional: She is oriented to person, place, and time. She appears well-developed and well-nourished. No distress.  HENT:  Head: Normocephalic and atraumatic.  Fractured last bottom right molar.  No area of fluctuants.  Uvula midline.  No pharyngeal edema.  Right sided tonsillar lymphadenopathy    Eyes: EOM are normal.  Neck: Neck supple. No tracheal deviation present.  Cardiovascular: Normal rate.   Pulmonary/Chest: Effort normal. No respiratory distress.  Musculoskeletal: Normal range of motion.  Neurological: She is alert and oriented to person, place, and time.  Skin: Skin is warm and dry.  Psychiatric: She has a normal mood and affect. Her behavior is normal.    ED Course  Procedures   COORDINATION OF CARE:  4:06 PM Discussed  course of care with pt which includes pain medication.  Pt understands and agrees.       Labs Reviewed - No data to display No results found.   1. Pain, dental       MDM  Patient with toothache.  No gross abscess.  Exam unconcerning for Ludwig's angina or spread of infection.  Will treat with penicillin and pain medicine.  Urged patient to follow-up with dentist.     I personally performed the services described in this documentation, which was scribed in my presence. The recorded information has been reviewed and is accurate.        Arthor Captain, PA-C 01/25/13 1625

## 2013-01-25 NOTE — ED Notes (Signed)
Pt reports dental pain and R side sore throat.  Pain is worse when swallowing.

## 2013-02-10 ENCOUNTER — Inpatient Hospital Stay (HOSPITAL_COMMUNITY)
Admission: AD | Admit: 2013-02-10 | Discharge: 2013-02-10 | Disposition: A | Discharge status: Home / Self Care | Payer: Medicaid Other | Source: Ambulatory Visit | Attending: Obstetrics and Gynecology | Admitting: Obstetrics and Gynecology

## 2013-02-10 ENCOUNTER — Encounter (HOSPITAL_COMMUNITY): Payer: Self-pay | Admitting: Family

## 2013-02-10 DIAGNOSIS — R109 Unspecified abdominal pain: Secondary | ICD-10-CM | POA: Insufficient documentation

## 2013-02-10 DIAGNOSIS — B373 Candidiasis of vulva and vagina: Secondary | ICD-10-CM

## 2013-02-10 DIAGNOSIS — N949 Unspecified condition associated with female genital organs and menstrual cycle: Secondary | ICD-10-CM | POA: Insufficient documentation

## 2013-02-10 DIAGNOSIS — B3731 Acute candidiasis of vulva and vagina: Secondary | ICD-10-CM | POA: Insufficient documentation

## 2013-02-10 LAB — URINE MICROSCOPIC-ADD ON

## 2013-02-10 LAB — URINALYSIS, ROUTINE W REFLEX MICROSCOPIC
Bilirubin Urine: NEGATIVE
Glucose, UA: NEGATIVE mg/dL
Ketones, ur: NEGATIVE mg/dL
Nitrite: NEGATIVE
Protein, ur: NEGATIVE mg/dL
Specific Gravity, Urine: >1.03 — ABNORMAL HIGH (ref 1.005–1.030)
Urobilinogen, UA: 0.2 mg/dL (ref 0.0–1.0)
pH: 6 (ref 5.0–8.0)

## 2013-02-10 LAB — WET PREP, GENITAL: Yeast Wet Prep HPF POC: NONE SEEN

## 2013-02-10 LAB — POCT PREGNANCY, URINE: Preg Test, Ur: NEGATIVE

## 2013-02-10 MED ORDER — KETOROLAC TROMETHAMINE 60 MG/2ML IM SOLN
60.0000 mg | Freq: Once | INTRAMUSCULAR | Status: AC
  Administered 2013-02-10: 60 mg via INTRAMUSCULAR
  Filled 2013-02-10: qty 2

## 2013-02-10 MED ORDER — NYSTATIN-TRIAMCINOLONE 100000-0.1 UNIT/GM-% EX CREA: TOPICAL_CREAM | CUTANEOUS | Status: DC

## 2013-02-10 MED ORDER — FLUCONAZOLE 150 MG PO TABS: 150.0000 mg | ORAL_TABLET | Freq: Once | ORAL | Status: DC

## 2013-02-10 MED ORDER — TERCONAZOLE 0.4 % VA CREA: 1.0000 | TOPICAL_CREAM | Freq: Every day | VAGINAL | Status: DC

## 2013-02-10 NOTE — MAU Note (Signed)
Pt reports having severe vaginal pain and swelling that started yesterday and got worse today. Pt os post partum 1 month. Has history of HPV and was told she may have a "flare up" after the baby was born.

## 2013-02-10 NOTE — MAU Provider Note (Signed)
History     CSN: [id number]  Arrival date and time: 02/10/13 1521   First Provider Initiated Contact with Patient 02/10/13 1627      Chief Complaint  Patient presents with  . Vaginal Pain   HPI [name] 27 y.o. Comes to MAU today with vaginal irritation.  Is postpartum - delivery was 01-08-13.  Is not breastfeeding.  Was seen in [facility] ER two weeks ago with tooth pain and given amoxicillin for treatment.  Has finished medication and has had severe vaginal irritation x 2 days.  Cannot tolerate underwear.  Denies itching.  Denies any sex since delivery.  Also having lower abdominal pain.  Reports LMP as last week.  OB History   Grav Para Term Preterm Abortions TAB SAB Ect Mult Living   4 3 3  0 1 0 1 0 0 3      Past Medical History  Diagnosis Date  . Asthma   . Headache(784.0)     migraines  . Depression   . PIH (pregnancy induced hypertension)   . Late prenatal care     22wks  . Anemia 2007  . HPV (human papilloma virus) anogenital infection   . Recurrent UTI   . Abnormal Pap smear   . Chlamydia infection     has no taken treatment as of 01/03/2013  . SVD (spontaneous vaginal delivery) 01/08/2013    Past Surgical History  Procedure Laterality Date  . No past surgeries      Family History  Problem Relation Age of Onset  . Anesthesia problems Neg Hx   . Hypotension Neg Hx   . Malignant hyperthermia Neg Hx   . Pseudochol deficiency Neg Hx   . Heart disease Maternal Grandmother     clots in heart  . Stroke Maternal Grandmother   . Diabetes Mother   . Heart disease Maternal Grandfather     pacemaker  . Miscarriages / Stillbirths Paternal Aunt   . Stroke Paternal Grandmother     History  Substance Use Topics  . Smoking status: Current Every Day Smoker -- 0.25 packs/day for 5 years    Types: Cigarettes  . Smokeless tobacco: Never Used  . Alcohol Use: No    Allergies:  Allergies  Allergen Reactions  . Corn Syrup Anaphylaxis and Hives    Prescriptions  prior to admission  Medication Sig Dispense Refill  . acetaminophen (TYLENOL) 325 MG tablet Take 325-650 mg by mouth every 6 (six) hours as needed for pain.      Marland Kitchen albuterol (PROVENTIL HFA;VENTOLIN HFA) 108 (90 BASE) MCG/ACT inhaler Inhale 2 puffs into the lungs every 6 (six) hours as needed for wheezing (for excercised-induced wheezing).      . diphenhydrAMINE (BENADRYL) 25 mg capsule Take 25 mg by mouth daily as needed for itching.      . ferrous sulfate 325 (65 FE) MG tablet Take 325 mg by mouth 2 (two) times daily.       Marland Kitchen ibuprofen (ADVIL,MOTRIN) 800 MG tablet Take 1 tablet (800 mg total) by mouth every 8 (eight) hours as needed for pain.  45 tablet  1  . Prenatal Vit-Fe Fumarate-FA (MULTIVITAMIN-PRENATAL) 27-0.8 MG TABS Take 1 tablet by mouth daily at 12 noon.  30 each  12  . amoxicillin (AMOXIL) 500 MG capsule Take 1 capsule (500 mg total) by mouth 2 (two) times daily.  20 capsule  0  . oxyCODONE-acetaminophen (PERCOCET) 5-325 MG per tablet Take 2 tablets by mouth every 4 (four) hours  as needed for pain.  20 tablet  0    Review of Systems  Constitutional: Negative for fever.  Gastrointestinal: Positive for abdominal pain. Negative for nausea, vomiting, diarrhea and constipation.  Genitourinary:       No vaginal discharge. No vaginal bleeding. No dysuria.   Physical Exam   Blood pressure 111/68, pulse 61, temperature 98 F (36.7 C), temperature source Oral, resp. rate 18, height 5\' 8"  (1.727 m), weight 165 lb (74.844 kg), last menstrual period 01/27/2013.  Physical Exam  Nursing note and vitals reviewed. Constitutional: She is oriented to person, place, and time. She appears well-developed and well-nourished.  HENT:  Head: Normocephalic.  Eyes: EOM are normal.  Neck: Neck supple.  GI: Soft. There is tenderness. There is no rebound and no guarding.  Tender all across the lower abdomen  Genitourinary:  Speculum exam: Vulva - very pink and edematous Vagina - Small amount of  bloody pink discharge, no odor, no adherent discharge Cervix - No contact bleeding Bimanual exam: deferred due to client's pain GC/Chlam, wet prep done Chaperone present for exam.  Musculoskeletal: Normal range of motion.  Neurological: She is alert and oriented to person, place, and time.  Skin: Skin is warm and dry.  Psychiatric: She has a normal mood and affect.    MAU Course  Procedures Toradol 60 mg IM for pain and pain was greatly relieved while in MAU  MDM Results for orders placed during the hospital encounter of 02/10/13 (from the past 24 hour(s))  URINALYSIS, ROUTINE W REFLEX MICROSCOPIC     Status: Abnormal   Collection Time    02/10/13  2:40 PM      Result Value Range   Color, Urine YELLOW  YELLOW   APPearance CLEAR  CLEAR   Specific Gravity, Urine >1.030 (*) 1.005 - 1.030   pH 6.0  5.0 - 8.0   Glucose, UA NEGATIVE  NEGATIVE mg/dL   Hgb urine dipstick LARGE (*) NEGATIVE   Bilirubin Urine NEGATIVE  NEGATIVE   Ketones, ur NEGATIVE  NEGATIVE mg/dL   Protein, ur NEGATIVE  NEGATIVE mg/dL   Urobilinogen, UA 0.2  0.0 - 1.0 mg/dL   Nitrite NEGATIVE  NEGATIVE   Leukocytes, UA SMALL (*) NEGATIVE  URINE MICROSCOPIC-ADD ON     Status: Abnormal   Collection Time    02/10/13  2:40 PM      Result Value Range   Squamous Epithelial / LPF FEW (*) RARE   WBC, UA 3-6  <3 WBC/hpf   RBC / HPF 0-2  <3 RBC/hpf   Bacteria, UA RARE  RARE   Urine-Other MUCOUS PRESENT    WET PREP, GENITAL     Status: Abnormal   Collection Time    02/10/13  4:30 PM      Result Value Range   Yeast Wet Prep HPF POC NONE SEEN  NONE SEEN   Trich, Wet Prep NONE SEEN  NONE SEEN   Clue Cells Wet Prep HPF POC NONE SEEN  NONE SEEN   WBC, Wet Prep HPF POC MODERATE (*) NONE SEEN  POCT PREGNANCY, URINE     Status: None   Collection Time    02/10/13  4:41 PM      Result Value Range   Preg Test, Ur NEGATIVE  NEGATIVE    Assessment and Plan  Vaginal yeast infection with vulvar  irritation  Plan Prescriptions for Diflucan, Terazol and Nystatin-triamcinalone cream given Keep your postpartum checkup in the office Call the office if your  symptoms worsen.  Katrice Goel 02/10/2013, 4:52 PM

## 2013-02-10 NOTE — MAU Note (Signed)
Patient presents to MAU with c/o vaginal pain and burning. Reports increased burning noted with urination and pressure. Patient notes she has lower pelvic pressure, improving when she lies down only.  Patient reports she is unable to tolerate wearing underwear at night. Reports some pinkish-red discharge noted on wiping.  S/S unrelieved with Tylenol (last dose yesterday). States she is has not being feeling well for last few days and has not slept well. Low-grade fever for last couple of days.

## 2013-04-11 ENCOUNTER — Encounter: Payer: Self-pay | Admitting: Internal Medicine

## 2013-04-11 ENCOUNTER — Ambulatory Visit: Payer: Medicaid Other | Attending: Internal Medicine | Admitting: Internal Medicine

## 2013-04-11 VITALS — BP 119/85 | HR 75 | Temp 98.7°F | Resp 16 | Ht 68.0 in | Wt 167.0 lb

## 2013-04-11 DIAGNOSIS — F3289 Other specified depressive episodes: Secondary | ICD-10-CM

## 2013-04-11 DIAGNOSIS — F329 Major depressive disorder, single episode, unspecified: Secondary | ICD-10-CM | POA: Insufficient documentation

## 2013-04-11 DIAGNOSIS — D649 Anemia, unspecified: Secondary | ICD-10-CM | POA: Insufficient documentation

## 2013-04-11 MED ORDER — TRAZODONE 25 MG HALF TABLET: 25.0000 mg | ORAL_TABLET | Freq: Every evening | ORAL | Status: DC | PRN

## 2013-04-11 MED ORDER — FLUOXETINE HCL 20 MG PO TABS: 20.0000 mg | ORAL_TABLET | Freq: Every day | ORAL | Status: DC

## 2013-04-11 MED ORDER — FERROUS SULFATE 325 (65 FE) MG PO TABS: 325.0000 mg | ORAL_TABLET | Freq: Two times a day (BID) | ORAL | Status: DC

## 2013-04-11 MED ORDER — ALBUTEROL SULFATE HFA 108 (90 BASE) MCG/ACT IN AERS: 2.0000 | INHALATION_SPRAY | Freq: Four times a day (QID) | RESPIRATORY_TRACT | Status: DC | PRN

## 2013-04-11 NOTE — Progress Notes (Signed)
Patient ID: [name], female   DOB: 05/07/1986, 28 y.o.   MRN: [id number]  CC:  HPI: 27 year old female who is here for evaluation of depression, she 3 months postpartum, has 3 kids and is a working mother. Her significant other works a third shift and sleeps during the day, and is unable to help much around the house. The patient is a work up was stressed, has not been sleeping. She denies any suicidal or homicidal ideation. She used to be on medication for depression but does not recall the name She has a family history of depression  Allergies  Allergen Reactions  . Corn Syrup Anaphylaxis and Hives   Past Medical History  Diagnosis Date  . Asthma   . Headache(784.0)     migraines  . Depression   . PIH (pregnancy induced hypertension)   . Late prenatal care     22wks  . Anemia 2007  . HPV (human papilloma virus) anogenital infection   . Recurrent UTI   . Abnormal Pap smear   . Chlamydia infection     has no taken treatment as of 01/03/2013  . SVD (spontaneous vaginal delivery) 01/08/2013   Current Outpatient Prescriptions on File Prior to Visit  Medication Sig Dispense Refill  . acetaminophen (TYLENOL) 325 MG tablet Take 325-650 mg by mouth every 6 (six) hours as needed for pain.      Marland Kitchen amoxicillin (AMOXIL) 500 MG capsule Take 1 capsule (500 mg total) by mouth 2 (two) times daily.  20 capsule  0  . diphenhydrAMINE (BENADRYL) 25 mg capsule Take 25 mg by mouth daily as needed for itching.      . fluconazole (DIFLUCAN) 150 MG tablet Take 1 tablet (150 mg total) by mouth once.  1 tablet  1  . ibuprofen (ADVIL,MOTRIN) 800 MG tablet Take 1 tablet (800 mg total) by mouth every 8 (eight) hours as needed for pain.  45 tablet  1  . nystatin-triamcinolone (MYCOLOG II) cream Apply to affected area twice a day.  15 g  0  . oxyCODONE-acetaminophen (PERCOCET) 5-325 MG per tablet Take 2 tablets by mouth every 4 (four) hours as needed for pain.  20 tablet  0  . Prenatal Vit-Fe Fumarate-FA  (MULTIVITAMIN-PRENATAL) 27-0.8 MG TABS Take 1 tablet by mouth daily at 12 noon.  30 each  12  . terconazole (TERAZOL 7) 0.4 % vaginal cream Place 1 applicator vaginally at bedtime. Use for 7 days.  45 g  0   No current facility-administered medications on file prior to visit.   Family History  Problem Relation Age of Onset  . Anesthesia problems Neg Hx   . Hypotension Neg Hx   . Malignant hyperthermia Neg Hx   . Pseudochol deficiency Neg Hx   . Heart disease Maternal Grandmother     clots in heart  . Stroke Maternal Grandmother   . Diabetes Mother   . Heart disease Maternal Grandfather     pacemaker  . Miscarriages / Stillbirths Paternal Aunt   . Stroke Paternal Grandmother    History   Social History  . Marital Status: Single    Spouse Name: N/A    Number of Children: N/A  . Years of Education: N/A   Occupational History  . Not on file.   Social History Main Topics  . Smoking status: Current Every Day Smoker -- 0.25 packs/day for 5 years    Types: Cigarettes  . Smokeless tobacco: Never Used  . Alcohol Use: No  .  Drug Use: No  . Sexual Activity: Yes   Other Topics Concern  . Not on file   Social History Narrative  . No narrative on file    Review of Systems  Constitutional: Negative for fever, chills, diaphoresis, activity change, appetite change and fatigue.  HENT: Negative for ear pain, nosebleeds, congestion, facial swelling, rhinorrhea, neck pain, neck stiffness and ear discharge.   Eyes: Negative for pain, discharge, redness, itching and visual disturbance.  Respiratory: Negative for cough, choking, chest tightness, shortness of breath, wheezing and stridor.   Cardiovascular: Negative for chest pain, palpitations and leg swelling.  Gastrointestinal: Negative for abdominal distention.  Genitourinary: Negative for dysuria, urgency, frequency, hematuria, flank pain, decreased urine volume, difficulty urinating and dyspareunia.  Musculoskeletal: Negative for  back pain, joint swelling, arthralgias and gait problem.  Neurological: Negative for dizziness, tremors, seizures, syncope, facial asymmetry, speech difficulty, weakness, light-headedness, numbness and headaches.  Hematological: Negative for adenopathy. Does not bruise/bleed easily.  Psychiatric/Behavioral: Negative for hallucinations, behavioral problems, confusion, dysphoric mood, decreased concentration and agitation.    Objective:   Filed Vitals:   04/11/13 1518  BP: 119/85  Pulse: 75  Temp: 98.7 F (37.1 C)  Resp: 16    Physical Exam  Constitutional: Appears well-developed and well-nourished. No distress.  HENT: Normocephalic. External right and left ear normal. Oropharynx is clear and moist.  Eyes: Conjunctivae and EOM are normal. PERRLA, no scleral icterus.  Neck: Normal ROM. Neck supple. No JVD. No tracheal deviation. No thyromegaly.  CVS: RRR, S1/S2 +, no murmurs, no gallops, no carotid bruit.  Pulmonary: Effort and breath sounds normal, no stridor, rhonchi, wheezes, rales.  Abdominal: Soft. BS +,  no distension, tenderness, rebound or guarding.  Musculoskeletal: Normal range of motion. No edema and no tenderness.  Lymphadenopathy: No lymphadenopathy noted, cervical, inguinal. Neuro: Alert. Normal reflexes, muscle tone coordination. No cranial nerve deficit. Skin: Skin is warm and dry. No rash noted. Not diaphoretic. No erythema. No pallor.  Psychiatric: Normal mood and affect. Behavior, judgment, thought content normal.   Lab Results  Component Value Date   WBC 9.7 01/13/2013   HGB 9.9* 01/13/2013   HCT 29.5* 01/13/2013   MCV 88.1 01/13/2013   PLT 312 01/13/2013   Lab Results  Component Value Date   CREATININE 0.9 11/29/2010   BUN 6 11/29/2010   NA 136 11/29/2010   K 3.3* 11/29/2010   CL 103 11/29/2010    No results found for this basename: HGBA1C   Lipid Panel  No results found for this basename: chol, trig, hdl, cholhdl, vldl, ldlcalc       Assessment and  plan:   Patient Active Problem List   Diagnosis Date Noted  . Asthma 06/13/2011  . Depression 06/13/2011   Depression Patient is not breast-feeding therefore we will go ahead and prescribe trazodone for sleep and Prozac for anxiety Psychiatric referral has been provided Patient will follow up with [place] in one month  Anemia last hemoglobin of 10.1 with recheck CBC today and refill iron  tablets     The patient was given clear instructions to go to ER or return to medical center if symptoms don't improve, worsen or new problems develop. The patient verbalized understanding. The patient was told to call to get any lab results if not heard anything in the next week.

## 2013-04-11 NOTE — Progress Notes (Signed)
PT HERE TO ESTABLISH CARE FOR HX DEPRESSION SCREENING DONE POST PARTUM NEED TO F/U STATES SHE HAS 3 KIDS AND ANXIETY LEVEL HAS INCREASED AFFECTING WORK. DENIES PAIN

## 2013-04-12 LAB — CBC WITH DIFFERENTIAL/PLATELET
Eosinophils Relative: 6 % — ABNORMAL HIGH (ref 0–5)
HCT: 35 % — ABNORMAL LOW (ref 36.0–46.0)
Lymphocytes Relative: 41 % (ref 12–46)
Lymphs Abs: 3.6 10*3/uL (ref 0.7–4.0)
MCV: 85.2 fL (ref 78.0–100.0)
Monocytes Absolute: 0.5 10*3/uL (ref 0.1–1.0)
Monocytes Relative: 6 % (ref 3–12)
RDW: 14.2 % (ref 11.5–15.5)
WBC: 8.9 10*3/uL (ref 4.0–10.5)

## 2013-04-25 ENCOUNTER — Ambulatory Visit: Payer: Medicaid Other

## 2013-06-21 ENCOUNTER — Emergency Department (HOSPITAL_COMMUNITY)
Admission: EM | Admit: 2013-06-21 | Discharge: 2013-06-21 | Disposition: A | Discharge status: Home / Self Care | Payer: Medicaid Other | Attending: Emergency Medicine | Admitting: Emergency Medicine

## 2013-06-21 ENCOUNTER — Encounter (HOSPITAL_COMMUNITY): Payer: Self-pay | Admitting: Emergency Medicine

## 2013-06-21 DIAGNOSIS — Z8619 Personal history of other infectious and parasitic diseases: Secondary | ICD-10-CM | POA: Insufficient documentation

## 2013-06-21 DIAGNOSIS — F17200 Nicotine dependence, unspecified, uncomplicated: Secondary | ICD-10-CM | POA: Insufficient documentation

## 2013-06-21 DIAGNOSIS — Z87440 Personal history of urinary (tract) infections: Secondary | ICD-10-CM | POA: Insufficient documentation

## 2013-06-21 DIAGNOSIS — J45909 Unspecified asthma, uncomplicated: Secondary | ICD-10-CM | POA: Insufficient documentation

## 2013-06-21 DIAGNOSIS — J020 Streptococcal pharyngitis: Secondary | ICD-10-CM | POA: Insufficient documentation

## 2013-06-21 DIAGNOSIS — D649 Anemia, unspecified: Secondary | ICD-10-CM | POA: Insufficient documentation

## 2013-06-21 DIAGNOSIS — F329 Major depressive disorder, single episode, unspecified: Secondary | ICD-10-CM | POA: Insufficient documentation

## 2013-06-21 DIAGNOSIS — Z792 Long term (current) use of antibiotics: Secondary | ICD-10-CM | POA: Insufficient documentation

## 2013-06-21 DIAGNOSIS — Z79899 Other long term (current) drug therapy: Secondary | ICD-10-CM | POA: Insufficient documentation

## 2013-06-21 DIAGNOSIS — M542 Cervicalgia: Secondary | ICD-10-CM | POA: Insufficient documentation

## 2013-06-21 DIAGNOSIS — F3289 Other specified depressive episodes: Secondary | ICD-10-CM | POA: Insufficient documentation

## 2013-06-21 MED ORDER — PENICILLIN G BENZATHINE 1200000 UNIT/2ML IM SUSP
1.2000 10*6.[IU] | Freq: Once | INTRAMUSCULAR | Status: AC
  Administered 2013-06-21: 1.2 10*6.[IU] via INTRAMUSCULAR
  Filled 2013-06-21: qty 2

## 2013-06-21 MED ORDER — HYDROCODONE-ACETAMINOPHEN 7.5-325 MG/15ML PO SOLN: 10.0000 mL | Freq: Four times a day (QID) | ORAL | Status: DC | PRN

## 2013-06-21 NOTE — ED Provider Notes (Signed)
CSN: [id number]     Arrival date & time 06/21/13  1055 History   This chart was scribed for non-physician practitioner Fayrene Helper, PA-C working with Dagmar Hait, MD by Leone Payor, ED Scribe. This patient was seen in room TR06C/TR06C and the patient's care was started at 1055.    Chief Complaint  Patient presents with  . Sore Throat    The history is provided by the patient. No language interpreter was used.    HPI Comments: [name] is a 27 y.o. female who presents to the Emergency Department complaining of 3 days of gradual onset, constant sore throat that worsened this morning. Pt reports having a dental infection that required pulling the front, top teeth 1 week ago. Pt states she was given Amoxicillin but was only able to take about 3 days worth of medication. She has associated rhinnorhea, coughing, ear itching, neck pain. Pt states pain is worse with swallowing, eating, drinking. She denies nausea, vomiting, diarrhea, abdominal pain, rash.  Past Medical History  Diagnosis Date  . Asthma   . Headache(784.0)     migraines  . Depression   . PIH (pregnancy induced hypertension)   . Late prenatal care     22wks  . Anemia 2007  . HPV (human papilloma virus) anogenital infection   . Recurrent UTI   . Abnormal Pap smear   . Chlamydia infection     has no taken treatment as of 01/03/2013  . SVD (spontaneous vaginal delivery) 01/08/2013   Past Surgical History  Procedure Laterality Date  . No past surgeries     Family History  Problem Relation Age of Onset  . Anesthesia problems Neg Hx   . Hypotension Neg Hx   . Malignant hyperthermia Neg Hx   . Pseudochol deficiency Neg Hx   . Heart disease Maternal Grandmother     clots in heart  . Stroke Maternal Grandmother   . Diabetes Mother   . Heart disease Maternal Grandfather     pacemaker  . Miscarriages / Stillbirths Paternal Aunt   . Stroke Paternal Grandmother    History  Substance Use Topics  . Smoking  status: Current Every Day Smoker -- 0.25 packs/day for 5 years    Types: Cigarettes  . Smokeless tobacco: Never Used  . Alcohol Use: No   OB History   Grav Para Term Preterm Abortions TAB SAB Ect Mult Living   4 3 3  0 1 0 1 0 0 3     Review of Systems  HENT: Positive for rhinorrhea and sore throat.   Respiratory: Positive for cough.   Gastrointestinal: Negative for nausea, vomiting and abdominal pain.  Musculoskeletal: Positive for neck pain.  Skin: Negative for rash.    Allergies  Corn syrup  Home Medications   Current Outpatient Rx  Name  Route  Sig  Dispense  Refill  . acetaminophen (TYLENOL) 325 MG tablet   Oral   Take 325-650 mg by mouth every 6 (six) hours as needed for pain.         Marland Kitchen albuterol (PROVENTIL HFA;VENTOLIN HFA) 108 (90 BASE) MCG/ACT inhaler   Inhalation   Inhale 2 puffs into the lungs every 6 (six) hours as needed for wheezing (for excercised-induced wheezing).   1 Inhaler   2   . amoxicillin (AMOXIL) 500 MG capsule   Oral   Take 1 capsule (500 mg total) by mouth 2 (two) times daily.   20 capsule   0   .  diphenhydrAMINE (BENADRYL) 25 mg capsule   Oral   Take 25 mg by mouth daily as needed for itching.         . ferrous sulfate 325 (65 FE) MG tablet   Oral   Take 1 tablet (325 mg total) by mouth 2 (two) times daily.   60 tablet   2   . fluconazole (DIFLUCAN) 150 MG tablet   Oral   Take 1 tablet (150 mg total) by mouth once.   1 tablet   1   . FLUoxetine (PROZAC) 20 MG tablet   Oral   Take 1 tablet (20 mg total) by mouth daily.   30 tablet   3   . ibuprofen (ADVIL,MOTRIN) 800 MG tablet   Oral   Take 1 tablet (800 mg total) by mouth every 8 (eight) hours as needed for pain.   45 tablet   1   . nystatin-triamcinolone (MYCOLOG II) cream      Apply to affected area twice a day.   15 g   0   . oxyCODONE-acetaminophen (PERCOCET) 5-325 MG per tablet   Oral   Take 2 tablets by mouth every 4 (four) hours as needed for pain.    20 tablet   0   . Prenatal Vit-Fe Fumarate-FA (MULTIVITAMIN-PRENATAL) 27-0.8 MG TABS   Oral   Take 1 tablet by mouth daily at 12 noon.   30 each   12   . terconazole (TERAZOL 7) 0.4 % vaginal cream   Vaginal   Place 1 applicator vaginally at bedtime. Use for 7 days.   45 g   0   . traZODone (DESYREL) 25 mg TABS tablet   Oral   Take 0.5 tablets (25 mg total) by mouth at bedtime as needed for sleep.   30 tablet   3    BP 116/73  Pulse 97  Temp(Src) 98.1 F (36.7 C) (Oral)  Resp 20  Ht 5\' 8"  (1.727 m)  Wt 155 lb (70.308 kg)  BMI 23.57 kg/m2  SpO2 100% Physical Exam  Nursing note and vitals reviewed. Constitutional: She is oriented to person, place, and time. She appears well-developed and well-nourished.  HENT:  Head: Normocephalic and atraumatic.  Nose: Rhinorrhea present.  Mouth/Throat: Uvula is midline. No oropharyngeal exudate or tonsillar abscesses.  Uvula is midline. No tonsillar enlargement or exudate. No evidence of deep tissue infection. No trismus. Rhinorrhea.    Cardiovascular: Normal rate.   Pulmonary/Chest: Effort normal.  Abdominal: Soft. Bowel sounds are normal. She exhibits no distension. There is no splenomegaly. There is no tenderness.  Lymphadenopathy:    She has cervical adenopathy.  Neurological: She is alert and oriented to person, place, and time.  Skin: Skin is warm and dry.  Psychiatric: She has a normal mood and affect.    ED Course  Procedures (including critical care time)  DIAGNOSTIC STUDIES: Oxygen Saturation is 100% on RA, normal by my interpretation.    COORDINATION OF CARE: 11:25 AM Will order rapid strep test. Discussed treatment plan with pt at bedside and pt agreed to plan.   11:57 AM Strep positive.  Will treat with pen. IM.  Pt otherwise stable for d/c.  hycet for pain, ENT referral as needed.    Labs Review Labs Reviewed  RAPID STREP SCREEN - Abnormal; Notable for the following:    Streptococcus, Group A Screen  (Direct) POSITIVE (*)    All other components within normal limits   Imaging Review No results found.  EKG  Interpretation   None       MDM   1. Strep pharyngitis    BP 116/73  Pulse 97  Temp(Src) 98.1 F (36.7 C) (Oral)  Resp 20  Ht 5\' 8"  (1.727 m)  Wt 155 lb (70.308 kg)  BMI 23.57 kg/m2  SpO2 100%   I personally performed the services described in this documentation, which was scribed in my presence. The recorded information has been reviewed and is accurate.    Fayrene Helper, PA-C 06/21/13 1159

## 2013-06-21 NOTE — ED Provider Notes (Signed)
Medical screening examination/treatment/procedure(s) were performed by non-physician practitioner and as supervising physician I was immediately available for consultation/collaboration.  EKG Interpretation   None         William Dannilynn Gallina, MD 06/21/13 1542 

## 2013-06-21 NOTE — ED Notes (Signed)
Pt c/o sore throat with some fever x 3 days; pt sts recently had multiple teeth extracted

## 2013-08-17 ENCOUNTER — Emergency Department (HOSPITAL_COMMUNITY)
Admission: EM | Admit: 2013-08-17 | Discharge: 2013-08-17 | Disposition: A | Discharge status: Home / Self Care | Payer: Medicaid Other

## 2013-10-09 ENCOUNTER — Encounter (HOSPITAL_COMMUNITY): Payer: Self-pay | Admitting: Emergency Medicine

## 2013-10-09 ENCOUNTER — Emergency Department (HOSPITAL_COMMUNITY)
Admission: EM | Admit: 2013-10-09 | Discharge: 2013-10-09 | Disposition: A | Discharge status: Home / Self Care | Payer: Medicaid Other | Attending: Emergency Medicine | Admitting: Emergency Medicine

## 2013-10-09 DIAGNOSIS — Z862 Personal history of diseases of the blood and blood-forming organs and certain disorders involving the immune mechanism: Secondary | ICD-10-CM | POA: Insufficient documentation

## 2013-10-09 DIAGNOSIS — Z8659 Personal history of other mental and behavioral disorders: Secondary | ICD-10-CM | POA: Insufficient documentation

## 2013-10-09 DIAGNOSIS — J45909 Unspecified asthma, uncomplicated: Secondary | ICD-10-CM | POA: Insufficient documentation

## 2013-10-09 DIAGNOSIS — F17200 Nicotine dependence, unspecified, uncomplicated: Secondary | ICD-10-CM | POA: Insufficient documentation

## 2013-10-09 DIAGNOSIS — Z8679 Personal history of other diseases of the circulatory system: Secondary | ICD-10-CM | POA: Insufficient documentation

## 2013-10-09 DIAGNOSIS — Z8619 Personal history of other infectious and parasitic diseases: Secondary | ICD-10-CM | POA: Insufficient documentation

## 2013-10-09 DIAGNOSIS — Z87440 Personal history of urinary (tract) infections: Secondary | ICD-10-CM | POA: Insufficient documentation

## 2013-10-09 DIAGNOSIS — K029 Dental caries, unspecified: Secondary | ICD-10-CM | POA: Insufficient documentation

## 2013-10-09 DIAGNOSIS — Z79899 Other long term (current) drug therapy: Secondary | ICD-10-CM | POA: Insufficient documentation

## 2013-10-09 DIAGNOSIS — R112 Nausea with vomiting, unspecified: Secondary | ICD-10-CM | POA: Insufficient documentation

## 2013-10-09 MED ORDER — PENICILLIN V POTASSIUM 500 MG PO TABS: 500.0000 mg | ORAL_TABLET | Freq: Four times a day (QID) | ORAL | Status: DC

## 2013-10-09 MED ORDER — HYDROCODONE-ACETAMINOPHEN 5-325 MG PO TABS: ORAL_TABLET | ORAL | Status: DC

## 2013-10-09 MED ORDER — TRAMADOL HCL 50 MG PO TABS: 50.0000 mg | ORAL_TABLET | Freq: Four times a day (QID) | ORAL | Status: DC | PRN

## 2013-10-09 MED ORDER — TRAMADOL HCL 50 MG PO TABS
50.0000 mg | ORAL_TABLET | Freq: Once | ORAL | Status: AC
  Administered 2013-10-09: 50 mg via ORAL
  Filled 2013-10-09: qty 1

## 2013-10-09 NOTE — ED Notes (Signed)
Pt reports left upper dental pain worsening over last 2 days. Had filling that chipped. Pain is so bad can only eat soup. Pt is a x 4. In NAD

## 2013-10-09 NOTE — ED Notes (Signed)
PT ambulated with baseline gait; VSS; A&Ox3; no signs of distress; respirations even and unlabored; skin warm and dry; no questions upon discharge.  

## 2013-10-09 NOTE — Discharge Instructions (Signed)
Dental Caries Dental caries is tooth decay. This decay can cause a hole in teeth (cavity) that can get bigger and deeper over time. HOME CARE  Brush and floss your teeth. Do this at least two times a day.  Use a fluoride toothpaste.  Use a mouth rinse if told by your dentist or doctor.  Eat less sugary and starchy foods. Drink less sugary drinks.  Avoid snacking often on sugary and starchy foods. Avoid sipping often on sugary drinks.  Keep regular checkups and cleanings with your dentist.  Use fluoride supplements if told by your dentist or doctor.  Allow fluoride to be applied to teeth if told by your dentist or doctor. MAKE SURE YOU:  Understand these instructions.  Will watch your condition.  Will get help right away if you are not doing well or get worse. Document Released: 05/11/2008 Document Revised: 04/04/2013 Document Reviewed: 08/04/2012 [facility] Patient Information 2014 [place], [place].  Dental Pain Toothache is pain in or around a tooth. It may get worse with chewing or with cold or heat.  HOME CARE  Your dentist may use a numbing medicine during treatment. If so, you may need to avoid eating until the medicine wears off. Ask your dentist about this.  Only take medicine as told by your dentist or doctor.  Avoid chewing food near the painful tooth until after all treatment is done. Ask your dentist about this. GET HELP RIGHT AWAY IF:   The problem gets worse or new problems appear.  You have a fever.  There is redness and puffiness (swelling) of the face, jaw, or neck.  You cannot open your mouth.  There is pain in the jaw.  There is very bad pain that is not helped by medicine. MAKE SURE YOU:   Understand these instructions.  Will watch your condition.  Will get help right away if you are not doing well or get worse. Document Released: 01/19/2008 Document Revised: 10/25/2011 Document Reviewed: 01/19/2008 [facility] Patient Information 2014  [place], [place].

## 2013-10-09 NOTE — ED Provider Notes (Signed)
CSN: [id number]     Arrival date & time 10/09/13  4782 History   First MD Initiated Contact with Patient 10/09/13 1926     Chief Complaint  Patient presents with  . Dental Pain     (Consider location/radiation/quality/duration/timing/severity/associated sxs/prior Treatment) HPI Pt is a 28yo female with hx of asthma presenting to ED with c/o dental pain that has gradually worsened over last 2 days. Pt reports having a dental appointment scheduled that does accept her insurance, however reports it is not until 2 weeks from now.  Pain is constant, aching, throbbing, 7/10, worse with chewing.  Reports fever of 101 earlier this morning. Pt is afebrile at triage. Denies n/v/d.   Past Medical History  Diagnosis Date  . Asthma   . Headache(784.0)     migraines  . Depression   . PIH (pregnancy induced hypertension)   . Late prenatal care     22wks  . Anemia 2007  . HPV (human papilloma virus) anogenital infection   . Recurrent UTI   . Abnormal Pap smear   . Chlamydia infection     has no taken treatment as of 01/03/2013  . SVD (spontaneous vaginal delivery) 01/08/2013   Past Surgical History  Procedure Laterality Date  . No past surgeries     Family History  Problem Relation Age of Onset  . Anesthesia problems Neg Hx   . Hypotension Neg Hx   . Malignant hyperthermia Neg Hx   . Pseudochol deficiency Neg Hx   . Heart disease Maternal Grandmother     clots in heart  . Stroke Maternal Grandmother   . Diabetes Mother   . Heart disease Maternal Grandfather     pacemaker  . Miscarriages / Stillbirths Paternal Aunt   . Stroke Paternal Grandmother    History  Substance Use Topics  . Smoking status: Current Every Day Smoker -- 0.25 packs/day for 5 years    Types: Cigarettes  . Smokeless tobacco: Never Used  . Alcohol Use: No   OB History   Grav Para Term Preterm Abortions TAB SAB Ect Mult Living   4 3 3  0 1 0 1 0 0 3     Review of Systems  Constitutional: Positive for fever (  101F). Negative for chills.  HENT: Positive for dental problem. Negative for congestion, ear pain, mouth sores, sore throat, trouble swallowing and voice change.   Respiratory: Negative for cough, shortness of breath, wheezing and stridor.   Gastrointestinal: Positive for nausea and vomiting. Negative for abdominal pain, diarrhea and constipation.  All other systems reviewed and are negative.      Allergies  Corn syrup  Home Medications   Current Outpatient Rx  Name  Route  Sig  Dispense  Refill  . acetaminophen (TYLENOL) 500 MG tablet   Oral   Take 1,000 mg by mouth every 6 (six) hours as needed.         Marland Kitchen ibuprofen (ADVIL,MOTRIN) 200 MG tablet   Oral   Take 800 mg by mouth every 6 (six) hours as needed.         Marland Kitchen albuterol (PROVENTIL HFA;VENTOLIN HFA) 108 (90 BASE) MCG/ACT inhaler   Inhalation   Inhale 2 puffs into the lungs every 6 (six) hours as needed for wheezing (for excercised-induced wheezing).   1 Inhaler   2   . HYDROcodone-acetaminophen (NORCO/VICODIN) 5-325 MG per tablet      Take 1-2 tabs every 4-6 hours as needed for pain.   6 tablet  0   . penicillin v potassium (VEETID) 500 MG tablet   Oral   Take 1 tablet (500 mg total) by mouth 4 (four) times daily.   40 tablet   0    BP 129/68  Pulse 69  Temp(Src) 98.3 F (36.8 C) (Oral)  Resp 18  SpO2 100%  LMP 10/09/2013 Physical Exam  Nursing note and vitals reviewed. Constitutional: She is oriented to person, place, and time. She appears well-developed and well-nourished.  Pt sitting in exam chair, holding left side of her face, appears uncomfortable.  HENT:  Head: Normocephalic and atraumatic.  Right Ear: Hearing, tympanic membrane, external ear and ear canal normal.  Left Ear: Hearing, tympanic membrane, external ear and ear canal normal.  Nose: Nose normal.  Mouth/Throat: Uvula is midline, oropharynx is clear and moist and mucous membranes are normal. She does not have dentures. No oral  lesions. No trismus in the jaw. Abnormal dentition. Dental caries present. No dental abscesses, uvula swelling or lacerations. No oropharyngeal exudate, posterior oropharyngeal edema, posterior oropharyngeal erythema or tonsillar abscesses.    Dental caries throughout. Tenderness in left upper back gingiva. Appears to be missing tooth. No obvious dental abscess. No discharge or bleeding. No edema or fluctuance.   Eyes: EOM are normal.  Neck: Normal range of motion. Neck supple.  Cardiovascular: Normal rate.   Pulmonary/Chest: Effort normal. No stridor. No respiratory distress.  Musculoskeletal: Normal range of motion.  Lymphadenopathy:    She has no cervical adenopathy.  Neurological: She is alert and oriented to person, place, and time.  Skin: Skin is warm and dry.  Psychiatric: She has a normal mood and affect. Her behavior is normal.    ED Course  Procedures (including critical care time) Labs Review Labs Reviewed - No data to display Imaging Review No results found.  EKG Interpretation   None       MDM   Final diagnoses:  Pain due to dental caries    pt presenting with dental pain. No obvious dental abscess on oral exam. No airway involvement. Pt is afebrile. Will tx with antibiotics and pain medication. Pt asked about allergies and given tramadol in ED for pain, however when RN went to discharge pt, pt reported diarrhea with tramadol. Rx: PCN and norco. Advised to f/u as scheduled with dentist in 2 weeks. Return precautions provided. Also provided pt info for dentist on call, Dr. Russella DarBenitez. Return precautions provided. Pt verbalized understanding and agreement with tx plan.     Junius Finnerrin O'Malley, PA-C 10/09/13 2038

## 2013-10-09 NOTE — ED Notes (Signed)
Warm compress given

## 2013-10-10 NOTE — ED Provider Notes (Signed)
Medical screening examination/treatment/procedure(s) were performed by non-physician practitioner and as supervising physician I was immediately available for consultation/collaboration.  EKG Interpretation   None         Courtney F Horton, MD 10/10/13 0044 

## 2013-11-10 ENCOUNTER — Emergency Department (HOSPITAL_COMMUNITY)
Admission: EM | Admit: 2013-11-10 | Discharge: 2013-11-10 | Disposition: A | Discharge status: Home / Self Care | Payer: Medicaid Other | Attending: Emergency Medicine | Admitting: Emergency Medicine

## 2013-11-10 ENCOUNTER — Encounter (HOSPITAL_COMMUNITY): Payer: Self-pay | Admitting: Emergency Medicine

## 2013-11-10 DIAGNOSIS — Z87440 Personal history of urinary (tract) infections: Secondary | ICD-10-CM | POA: Insufficient documentation

## 2013-11-10 DIAGNOSIS — Z8619 Personal history of other infectious and parasitic diseases: Secondary | ICD-10-CM | POA: Insufficient documentation

## 2013-11-10 DIAGNOSIS — Z8679 Personal history of other diseases of the circulatory system: Secondary | ICD-10-CM | POA: Insufficient documentation

## 2013-11-10 DIAGNOSIS — Z8659 Personal history of other mental and behavioral disorders: Secondary | ICD-10-CM | POA: Insufficient documentation

## 2013-11-10 DIAGNOSIS — F17200 Nicotine dependence, unspecified, uncomplicated: Secondary | ICD-10-CM | POA: Insufficient documentation

## 2013-11-10 DIAGNOSIS — J45909 Unspecified asthma, uncomplicated: Secondary | ICD-10-CM | POA: Insufficient documentation

## 2013-11-10 DIAGNOSIS — Z79899 Other long term (current) drug therapy: Secondary | ICD-10-CM | POA: Insufficient documentation

## 2013-11-10 DIAGNOSIS — H9209 Otalgia, unspecified ear: Secondary | ICD-10-CM | POA: Insufficient documentation

## 2013-11-10 DIAGNOSIS — J069 Acute upper respiratory infection, unspecified: Secondary | ICD-10-CM

## 2013-11-10 DIAGNOSIS — Z862 Personal history of diseases of the blood and blood-forming organs and certain disorders involving the immune mechanism: Secondary | ICD-10-CM | POA: Insufficient documentation

## 2013-11-10 LAB — RAPID STREP SCREEN (MED CTR MEBANE ONLY): Streptococcus, Group A Screen (Direct): NEGATIVE

## 2013-11-10 MED ORDER — GUAIFENESIN 100 MG/5ML PO LIQD: 100.0000 mg | ORAL | Status: DC | PRN

## 2013-11-10 MED ORDER — HYDROCODONE-ACETAMINOPHEN 7.5-325 MG/15ML PO SOLN
10.0000 mL | Freq: Four times a day (QID) | ORAL | Status: DC | PRN
Start: 2013-11-10 — End: 2013-12-10

## 2013-11-10 NOTE — Discharge Instructions (Signed)

## 2013-11-10 NOTE — ED Provider Notes (Signed)
CSN: [id number]     Arrival date & time 11/10/13  1150 History   First MD Initiated Contact with Patient 11/10/13 1224   This chart was scribed for Mackenzie Helper PA-C, a non-physician practitioner working with Gerhard Munch, MD by Lewanda Rife, ED Scribe. This patient was seen in room TR11C/TR11C and the patient's care was started at 12:46 PM     Chief Complaint  Patient presents with  . Sore Throat  . Otalgia     (Consider location/radiation/quality/duration/timing/severity/associated sxs/prior Treatment) The history is provided by the patient. No language interpreter was used.   HPI Comments: [name] is a 28 y.o. female who presents to the Emergency Department complaining of constant worsening sore throat onset 3 days. Reports associated left sided otalgia, left ear itchiness, subjective fever/chills (last night), non-productive cough, generalized myalgias, rhinorrhea, and sneezing. Denies any aggravating factors. Reports trying OTC ear drops, Dayquil, Nyquil with mild relief of symptoms. Denies associated abdominal pain, and rash. States she smokes cigarettes.  Past Medical History  Diagnosis Date  . Asthma   . Headache(784.0)     migraines  . Depression   . PIH (pregnancy induced hypertension)   . Late prenatal care     22wks  . Anemia 2007  . HPV (human papilloma virus) anogenital infection   . Recurrent UTI   . Abnormal Pap smear   . Chlamydia infection     has no taken treatment as of 01/03/2013  . SVD (spontaneous vaginal delivery) 01/08/2013   Past Surgical History  Procedure Laterality Date  . No past surgeries     Family History  Problem Relation Age of Onset  . Anesthesia problems Neg Hx   . Hypotension Neg Hx   . Malignant hyperthermia Neg Hx   . Pseudochol deficiency Neg Hx   . Heart disease Maternal Grandmother     clots in heart  . Stroke Maternal Grandmother   . Diabetes Mother   . Heart disease Maternal Grandfather     pacemaker  .  Miscarriages / Stillbirths Paternal Aunt   . Stroke Paternal Grandmother    History  Substance Use Topics  . Smoking status: Current Every Day Smoker -- 0.25 packs/day for 5 years    Types: Cigarettes  . Smokeless tobacco: Never Used  . Alcohol Use: No   OB History   Grav Para Term Preterm Abortions TAB SAB Ect Mult Living   4 3 3  0 1 0 1 0 0 3     Review of Systems  Constitutional: Positive for fever and chills.  HENT: Positive for ear pain and sore throat.   Psychiatric/Behavioral: Negative for confusion.      Allergies  Corn syrup  Home Medications   Current Outpatient Rx  Name  Route  Sig  Dispense  Refill  . acetaminophen (TYLENOL) 500 MG tablet   Oral   Take 1,000 mg by mouth every 6 (six) hours as needed for mild pain or fever.          Marland Kitchen albuterol (PROVENTIL HFA;VENTOLIN HFA) 108 (90 BASE) MCG/ACT inhaler   Inhalation   Inhale 2 puffs into the lungs every 6 (six) hours as needed for wheezing (for excercised-induced wheezing).   1 Inhaler   2   . ibuprofen (ADVIL,MOTRIN) 200 MG tablet   Oral   Take 800 mg by mouth every 6 (six) hours as needed for fever or moderate pain.           BP 117/70  Pulse 74  Temp(Src) 97.8 F (36.6 C) (Oral)  Resp 20  Wt 147 lb (66.679 kg)  SpO2 100%  LMP 11/07/2013 Physical Exam  Nursing note and vitals reviewed. Constitutional: She is oriented to person, place, and time. She appears well-developed and well-nourished. No distress.  HENT:  Head: Normocephalic and atraumatic.  Right Ear: External ear and ear canal normal.  Left Ear: External ear and ear canal normal.  Mouth/Throat: Uvula is midline and oropharynx is clear and moist. No trismus in the jaw. No dental abscesses. No oropharyngeal exudate, posterior oropharyngeal edema, posterior oropharyngeal erythema or tonsillar abscesses.  TMs are mildly dull, but no significant of erythema   Post-nasal drip noted  Eyes: Conjunctivae and EOM are normal.  Neck: Neck  supple. No tracheal deviation present.  Cardiovascular: Normal rate.   Pulmonary/Chest: Effort normal. No respiratory distress.  Abdominal: Soft. There is no splenomegaly. There is no tenderness.  Musculoskeletal: Normal range of motion.  Lymphadenopathy:    She has cervical adenopathy.  Neurological: She is alert and oriented to person, place, and time.  Skin: Skin is warm and dry.  Psychiatric: She has a normal mood and affect. Her behavior is normal.    ED Course  Procedures   COORDINATION OF CARE:  Nursing notes reviewed. Vital signs reviewed. Initial pt interview and examination performed.   12:45 PM-Discussed work up plan with pt at bedside, which includes rapid strep. Pt agrees with plan.  1:03 PM Strep test neg.  Throat culture sent.  Is afebrile, no evidence of deep tissue infection, no hypoxia to suggest pna.  Will treat for viral infection with cough medication and guaifenessin.    Treatment plan initiated:Medications - No data to display   Initial diagnostic testing ordered.     Labs Review Labs Reviewed  RAPID STREP SCREEN  CULTURE, GROUP A STREP   Imaging Review No results found.   EKG Interpretation None      MDM   Final diagnoses:  URI (upper respiratory infection)    BP 117/70  Pulse 74  Temp(Src) 97.8 F (36.6 C) (Oral)  Resp 20  Wt 147 lb (66.679 kg)  SpO2 100%  LMP 11/07/2013  I personally performed the services described in this documentation, which was scribed in my presence. The recorded information has been reviewed and is accurate.     Mackenzie HelperBowie Alec Jaros, PA-C 11/10/13 1304

## 2013-11-10 NOTE — ED Notes (Signed)
Pt reports sore throat x 3 days, now having left ear pain and reports fever/chills last night.

## 2013-11-10 NOTE — ED Provider Notes (Signed)
  Medical screening examination/treatment/procedure(s) were performed by non-physician practitioner and as supervising physician I was immediately available for consultation/collaboration.   EKG Interpretation None         Eldonna Neuenfeldt, MD 11/10/13 1335 

## 2013-11-12 LAB — CULTURE, GROUP A STREP

## 2013-11-13 NOTE — ED Notes (Signed)
+   strep Rx for Amoxicillin 500 mg BID x 10 days written by Teressa LowerVrinda Pickering  Needs to be called to pharmacy.

## 2013-11-13 NOTE — Progress Notes (Signed)
ED Antimicrobial Stewardship Positive Culture Follow Up   [name] is an 28 y.o. female who [facility] with a chief complaint of  Chief Complaint  Patient presents with  . Sore Throat  . Otalgia    Recent Results (from the past 720 hour(s))  RAPID STREP SCREEN     Status: None   Collection Time    11/10/13 12:27 PM      Result Value Ref Range Status   Streptococcus, Group A Screen (Direct) NEGATIVE  NEGATIVE Final   Comment: (NOTE)     A Rapid Antigen test may result negative if the antigen level in the     sample is below the detection level of this test. The FDA has not     cleared this test as a stand-alone test therefore the rapid antigen     negative result has reflexed to a Group A Strep culture.  CULTURE, GROUP A STREP     Status: None   Collection Time    11/10/13 12:27 PM      Result Value Ref Range Status   Specimen Description THROAT   Final   Special Requests NONE   Final   Culture     Final   Value: GROUP A STREP (S.PYOGENES) ISOLATED     Performed at [facility]   Report Status 11/12/2013 FINAL   Final    [x]  Patient discharged originally without antimicrobial agent and treatment is now indicated  New antibiotic prescription: Amoxicillin 500mg  PO BID x 10 days  ED Provider: Teressa LowerVrinda Pickering, NP   Sallee Provencalurner, Espiridion Supinski S 11/13/2013, 10:25 AM Infectious Diseases Pharmacist Phone# [phone]

## 2013-11-15 ENCOUNTER — Telehealth (HOSPITAL_BASED_OUTPATIENT_CLINIC_OR_DEPARTMENT_OTHER): Payer: Self-pay | Admitting: *Deleted

## 2013-12-10 ENCOUNTER — Emergency Department (HOSPITAL_COMMUNITY)
Admission: EM | Admit: 2013-12-10 | Discharge: 2013-12-10 | Disposition: A | Discharge status: Home / Self Care | Payer: Medicaid Other | Attending: Emergency Medicine | Admitting: Emergency Medicine

## 2013-12-10 ENCOUNTER — Encounter (HOSPITAL_COMMUNITY): Payer: Self-pay | Admitting: Emergency Medicine

## 2013-12-10 DIAGNOSIS — J069 Acute upper respiratory infection, unspecified: Secondary | ICD-10-CM | POA: Insufficient documentation

## 2013-12-10 DIAGNOSIS — F17200 Nicotine dependence, unspecified, uncomplicated: Secondary | ICD-10-CM | POA: Insufficient documentation

## 2013-12-10 DIAGNOSIS — K044 Acute apical periodontitis of pulpal origin: Secondary | ICD-10-CM | POA: Insufficient documentation

## 2013-12-10 DIAGNOSIS — Z8659 Personal history of other mental and behavioral disorders: Secondary | ICD-10-CM | POA: Insufficient documentation

## 2013-12-10 DIAGNOSIS — H9190 Unspecified hearing loss, unspecified ear: Secondary | ICD-10-CM | POA: Insufficient documentation

## 2013-12-10 DIAGNOSIS — Z8619 Personal history of other infectious and parasitic diseases: Secondary | ICD-10-CM | POA: Insufficient documentation

## 2013-12-10 DIAGNOSIS — K089 Disorder of teeth and supporting structures, unspecified: Secondary | ICD-10-CM | POA: Insufficient documentation

## 2013-12-10 DIAGNOSIS — K0889 Other specified disorders of teeth and supporting structures: Secondary | ICD-10-CM

## 2013-12-10 DIAGNOSIS — J45909 Unspecified asthma, uncomplicated: Secondary | ICD-10-CM | POA: Insufficient documentation

## 2013-12-10 DIAGNOSIS — R42 Dizziness and giddiness: Secondary | ICD-10-CM | POA: Insufficient documentation

## 2013-12-10 DIAGNOSIS — K047 Periapical abscess without sinus: Secondary | ICD-10-CM

## 2013-12-10 DIAGNOSIS — Z87440 Personal history of urinary (tract) infections: Secondary | ICD-10-CM | POA: Insufficient documentation

## 2013-12-10 DIAGNOSIS — R197 Diarrhea, unspecified: Secondary | ICD-10-CM | POA: Insufficient documentation

## 2013-12-10 DIAGNOSIS — Z79899 Other long term (current) drug therapy: Secondary | ICD-10-CM | POA: Insufficient documentation

## 2013-12-10 DIAGNOSIS — R51 Headache: Secondary | ICD-10-CM | POA: Insufficient documentation

## 2013-12-10 DIAGNOSIS — Z8679 Personal history of other diseases of the circulatory system: Secondary | ICD-10-CM | POA: Insufficient documentation

## 2013-12-10 DIAGNOSIS — M542 Cervicalgia: Secondary | ICD-10-CM | POA: Insufficient documentation

## 2013-12-10 DIAGNOSIS — Z791 Long term (current) use of non-steroidal anti-inflammatories (NSAID): Secondary | ICD-10-CM | POA: Insufficient documentation

## 2013-12-10 DIAGNOSIS — Z862 Personal history of diseases of the blood and blood-forming organs and certain disorders involving the immune mechanism: Secondary | ICD-10-CM | POA: Insufficient documentation

## 2013-12-10 LAB — RAPID STREP SCREEN (MED CTR MEBANE ONLY): STREPTOCOCCUS, GROUP A SCREEN (DIRECT): NEGATIVE

## 2013-12-10 MED ORDER — HYDROCODONE-ACETAMINOPHEN 5-325 MG PO TABS: 1.0000 | ORAL_TABLET | ORAL | Status: DC | PRN

## 2013-12-10 MED ORDER — AMOXICILLIN 500 MG PO CAPS: 500.0000 mg | ORAL_CAPSULE | Freq: Three times a day (TID) | ORAL | Status: DC

## 2013-12-10 MED ORDER — HYDROCODONE-ACETAMINOPHEN 5-325 MG PO TABS
1.0000 | ORAL_TABLET | Freq: Once | ORAL | Status: AC
  Administered 2013-12-10: 1 via ORAL
  Filled 2013-12-10: qty 1

## 2013-12-10 NOTE — ED Provider Notes (Signed)
CSN: [id number]     Arrival date & time 12/10/13  1239 History  This chart was scribed for non-physician practitioner, Johnnette Gourdobyn Albert, PA-C, working with Richardean Canalavid H Yao, MD, by Ellin MayhewMichael Levi, ED Scribe. This patient was seen in room TR07C/TR07C and the patient's care was started at 1:40 PM.  The history is provided by the patient. No language interpreter was used.   HPI Comments: [name] is a 28 y.o. female who presents to the Emergency Department with a chief complaint of progressively worsening URI with onset 1 month. Patient reports symptoms have been constant and progressively worsening, that began with a sore throat, productive cough with thick mucus and fever. Patient states that her symptoms have progressively worsened to include L ear pain with associated hearing loss that radiates to the L jaw, runny nose, congestion, dizziness, and diarrhea. Patient additionally reports L lower dental pain. She states that she had her teeth cleaned 1 month ago right before her symptoms began. She states that chewing on food worsens the ear pain and states that swallowing food hurts her throat. She reports that she had a fever of 101 last night taken at home and was able to break it this morning. She states she has taken Dayquil, Nyquil, and Robitussim with minimal relief of her cough. She denies any allergies to antibiotics.  Past Medical History  Diagnosis Date  . Asthma   . Headache(784.0)     migraines  . Depression   . PIH (pregnancy induced hypertension)   . Late prenatal care     22wks  . Anemia 2007  . HPV (human papilloma virus) anogenital infection   . Recurrent UTI   . Abnormal Pap smear   . Chlamydia infection     has no taken treatment as of 01/03/2013  . SVD (spontaneous vaginal delivery) 01/08/2013   Past Surgical History  Procedure Laterality Date  . No past surgeries     Family History  Problem Relation Age of Onset  . Anesthesia problems Neg Hx   . Hypotension Neg Hx   .  Malignant hyperthermia Neg Hx   . Pseudochol deficiency Neg Hx   . Heart disease Maternal Grandmother     clots in heart  . Stroke Maternal Grandmother   . Diabetes Mother   . Heart disease Maternal Grandfather     pacemaker  . Miscarriages / Stillbirths Paternal Aunt   . Stroke Paternal Grandmother    History  Substance Use Topics  . Smoking status: Current Every Day Smoker -- 0.25 packs/day for 5 years    Types: Cigarettes  . Smokeless tobacco: Never Used  . Alcohol Use: No   OB History   Grav Para Term Preterm Abortions TAB SAB Ect Mult Living   4 3 3  0 1 0 1 0 0 3     Review of Systems  Constitutional: Positive for fever and chills.  HENT: Positive for congestion, dental problem, ear pain, hearing loss, rhinorrhea and sore throat. Negative for facial swelling.   Respiratory: Positive for cough. Negative for shortness of breath.   Gastrointestinal: Positive for diarrhea. Negative for nausea and vomiting.  Musculoskeletal: Positive for arthralgias (L facial pain) and neck pain.  Neurological: Positive for dizziness. Negative for weakness.  A complete 10 system review of systems was obtained and all systems are negative except as noted in the HPI and PMH.   Allergies  Corn syrup  Home Medications   Prior to Admission medications   Medication Sig  Start Date End Date Taking? Authorizing Provider  acetaminophen (TYLENOL) 500 MG tablet Take 1,000 mg by mouth every 6 (six) hours as needed for mild pain or fever.     Historical Provider, MD  albuterol (PROVENTIL HFA;VENTOLIN HFA) 108 (90 BASE) MCG/ACT inhaler Inhale 2 puffs into the lungs every 6 (six) hours as needed for wheezing (for excercised-induced wheezing). 04/11/13   Richarda OverlieNayana Abrol, MD  guaiFENesin (ROBITUSSIN) 100 MG/5ML liquid Take 5-10 mLs (100-200 mg total) by mouth every 4 (four) hours as needed for cough. 11/10/13   Fayrene HelperBowie Tran, PA-C  HYDROcodone-acetaminophen (HYCET) 7.5-325 mg/15 ml solution Take 10 mLs by mouth 4  (four) times daily as needed for moderate pain. 11/10/13   Fayrene HelperBowie Tran, PA-C  ibuprofen (ADVIL,MOTRIN) 200 MG tablet Take 800 mg by mouth every 6 (six) hours as needed for fever or moderate pain.     Historical Provider, MD   Triage Vitals: BP 119/63  Pulse 90  Temp(Src) 98 F (36.7 C) (Oral)  Resp 18  Wt 154 lb (69.854 kg)  SpO2 100%  LMP 11/07/2013  Physical Exam  Nursing note and vitals reviewed. Constitutional: She is oriented to person, place, and time. She appears well-developed and well-nourished. No distress.  HENT:  Head: Normocephalic and atraumatic.  Mouth/Throat: Oropharynx is clear and moist. No dental abscesses.  Facial tenderness to the L side, no swelling. L upper 2nd molar tender with surrounding erythema and edema. Swallows secretion.  Eyes: Conjunctivae and EOM are normal.  Neck: Normal range of motion. Neck supple.  Cardiovascular: Normal rate, regular rhythm and normal heart sounds.   Pulmonary/Chest: Effort normal and breath sounds normal. No respiratory distress.  Musculoskeletal: Normal range of motion. She exhibits no edema.  Lymphadenopathy:       Head (left side): Submandibular adenopathy present.  Neurological: She is alert and oriented to person, place, and time. No sensory deficit.  Skin: Skin is warm and dry.  Psychiatric: She has a normal mood and affect. Her behavior is normal.   ED Course  Procedures (including critical care time)  COORDINATION OF CARE: 1:49 PM-Discussed negative strep test. Will prescribe amoxicillin for tooth infection and vicodin for pain. Encouraged patient to get plenty of rest, and to use an anti-inflammatory. F/u with dentist. Treatment plan discussed with patient and patient agrees.  Labs Review Labs Reviewed  RAPID STREP SCREEN  CULTURE, GROUP A STREP    Imaging Review No results found.   EKG Interpretation None      MDM   Final diagnoses:  Pain, dental  Dental infection  URI (upper respiratory infection)    Pt well appearing and in NAD. Afebrile, VSS.  Dental pain associated with dental infection. No evidence of dental abscess. Patient is afebrile, non toxic appearing and swallowing secretions well. I discussed importance of dental follow up for ultimate management of dental pain. I will also give amoxicillin and pain control. Symptomatic tx for URI discussed. Patient voices understanding and is agreeable to plan.   I personally performed the services described in this documentation, which was scribed in my presence. The recorded information has been reviewed and is accurate.    Trevor MaceRobyn M Albert, PA-C 12/10/13 [phone]

## 2013-12-10 NOTE — ED Notes (Signed)
Albert, PA at bedside for evaluation. 

## 2013-12-10 NOTE — Discharge Instructions (Signed)
Take antibiotic to completion. Take Vicodin for severe pain only. No driving or operating heavy machinery while taking vicodin. This medication may cause drowsiness. Follow up with your dentist.  Facial Infection You have an infection of your face. This requires special attention to help prevent serious problems. Infections in facial wounds can cause poor healing and scars. They can also spread to deeper tissues, especially around the eye. Wound and dental infections can lead to sinusitis, infection of the eye socket, and even meningitis. Permanent damage to the skin, eye, and nervous system may result if facial infections are not treated properly. With severe infections, hospital care for IV antibiotic injections may be needed if they don't respond to oral antibiotics. Antibiotics must be taken for the full course to insure the infection is eliminated. If the infection came from a bad tooth, it may have to be extracted when the infection is under control. Warm compresses may be applied to reduce skin irritation and remove drainage. You might need a tetanus shot now if:  You cannot remember when your last tetanus shot was.  You have never had a tetanus shot.  The object that caused your wound was dirty. If you need a tetanus shot, and you decide not to get one, there is a rare chance of getting tetanus. Sickness from tetanus can be serious. If you got a tetanus shot, your arm may swell, get red and warm to the touch at the shot site. This is common and not a problem. SEEK IMMEDIATE MEDICAL CARE IF:   You have increased swelling, redness, or trouble breathing.  You have a severe headache, dizziness, nausea, or vomiting.  You develop problems with your eyesight.  You have a fever. Document Released: 09/09/2004 Document Revised: 10/25/2011 Document Reviewed: 08/02/2005 [facility] Patient Information 2014 [place], [place].  Upper Respiratory Infection, Adult An upper respiratory infection (URI)  is also sometimes known as the common cold. The upper respiratory tract includes the nose, sinuses, throat, trachea, and bronchi. Bronchi are the airways leading to the lungs. Most people improve within 1 week, but symptoms can last up to 2 weeks. A residual cough may last even longer.  CAUSES Many different viruses can infect the tissues lining the upper respiratory tract. The tissues become irritated and inflamed and often become very moist. Mucus production is also common. A cold is contagious. You can easily spread the virus to others by oral contact. This includes kissing, sharing a glass, coughing, or sneezing. Touching your mouth or nose and then touching a surface, which is then touched by another person, can also spread the virus. SYMPTOMS  Symptoms typically develop 1 to 3 days after you come in contact with a cold virus. Symptoms vary from person to person. They may include:  Runny nose.  Sneezing.  Nasal congestion.  Sinus irritation.  Sore throat.  Loss of voice (laryngitis).  Cough.  Fatigue.  Muscle aches.  Loss of appetite.  Headache.  Low-grade fever. DIAGNOSIS  You might diagnose your own cold based on familiar symptoms, since most people get a cold 2 to 3 times a year. Your caregiver can confirm this based on your exam. Most importantly, your caregiver can check that your symptoms are not due to another disease such as strep throat, sinusitis, pneumonia, asthma, or epiglottitis. Blood tests, throat tests, and X-rays are not necessary to diagnose a common cold, but they may sometimes be helpful in excluding other more serious diseases. Your caregiver will decide if any further tests are  required. RISKS AND COMPLICATIONS  You may be at risk for a more severe case of the common cold if you smoke cigarettes, have chronic heart disease (such as heart failure) or lung disease (such as asthma), or if you have a weakened immune system. The very young and very old are also  at risk for more serious infections. Bacterial sinusitis, middle ear infections, and bacterial pneumonia can complicate the common cold. The common cold can worsen asthma and chronic obstructive pulmonary disease (COPD). Sometimes, these complications can require emergency medical care and may be life-threatening. PREVENTION  The best way to protect against getting a cold is to practice good hygiene. Avoid oral or hand contact with people with cold symptoms. Wash your hands often if contact occurs. There is no clear evidence that vitamin C, vitamin E, echinacea, or exercise reduces the chance of developing a cold. However, it is always recommended to get plenty of rest and practice good nutrition. TREATMENT  Treatment is directed at relieving symptoms. There is no cure. Antibiotics are not effective, because the infection is caused by a virus, not by bacteria. Treatment may include:  Increased fluid intake. Sports drinks offer valuable electrolytes, sugars, and fluids.  Breathing heated mist or steam (vaporizer or shower).  Eating chicken soup or other clear broths, and maintaining good nutrition.  Getting plenty of rest.  Using gargles or lozenges for comfort.  Controlling fevers with ibuprofen or acetaminophen as directed by your caregiver.  Increasing usage of your inhaler if you have asthma. Zinc gel and zinc lozenges, taken in the first 24 hours of the common cold, can shorten the duration and lessen the severity of symptoms. Pain medicines may help with fever, muscle aches, and throat pain. A variety of non-prescription medicines are available to treat congestion and runny nose. Your caregiver can make recommendations and may suggest nasal or lung inhalers for other symptoms.  HOME CARE INSTRUCTIONS   Only take over-the-counter or prescription medicines for pain, discomfort, or fever as directed by your caregiver.  Use a warm mist humidifier or inhale steam from a shower to increase air  moisture. This may keep secretions moist and make it easier to breathe.  Drink enough water and fluids to keep your urine clear or pale yellow.  Rest as needed.  Return to work when your temperature has returned to normal or as your caregiver advises. You may need to stay home longer to avoid infecting others. You can also use a face mask and careful hand washing to prevent spread of the virus. SEEK MEDICAL CARE IF:   After the first few days, you feel you are getting worse rather than better.  You need your caregiver's advice about medicines to control symptoms.  You develop chills, worsening shortness of breath, or brown or red sputum. These may be signs of pneumonia.  You develop yellow or brown nasal discharge or pain in the face, especially when you bend forward. These may be signs of sinusitis.  You develop a fever, swollen neck glands, pain with swallowing, or white areas in the back of your throat. These may be signs of strep throat. SEEK IMMEDIATE MEDICAL CARE IF:   You have a fever.  You develop severe or persistent headache, ear pain, sinus pain, or chest pain.  You develop wheezing, a prolonged cough, cough up blood, or have a change in your usual mucus (if you have chronic lung disease).  You develop sore muscles or a stiff neck. Document Released: 01/26/2001 Document  Revised: 10/25/2011 Document Reviewed: 12/04/2010 [facility] Patient Information 2014 [place], [place].

## 2013-12-10 NOTE — ED Notes (Signed)
Pt reports cough, fever, ear pain for several days. Also, left neck pain. Pt is a x 4. Afebrile in triage. Pt has runny nose.

## 2013-12-11 NOTE — ED Provider Notes (Signed)
Medical screening examination/treatment/procedure(s) were performed by non-physician practitioner and as supervising physician I was immediately available for consultation/collaboration.   EKG Interpretation None        David H Yao, MD 12/11/13 0709 

## 2013-12-12 LAB — CULTURE, GROUP A STREP

## 2014-02-07 ENCOUNTER — Telehealth (HOSPITAL_BASED_OUTPATIENT_CLINIC_OR_DEPARTMENT_OTHER): Payer: Self-pay | Admitting: Emergency Medicine

## 2014-03-25 ENCOUNTER — Inpatient Hospital Stay (HOSPITAL_COMMUNITY): Payer: Medicaid Other

## 2014-03-25 ENCOUNTER — Inpatient Hospital Stay (HOSPITAL_COMMUNITY)
Admission: AD | Admit: 2014-03-25 | Discharge: 2014-03-25 | Disposition: A | Discharge status: Home / Self Care | Payer: Medicaid Other | Source: Ambulatory Visit | Attending: Obstetrics & Gynecology | Admitting: Obstetrics & Gynecology

## 2014-03-25 ENCOUNTER — Encounter (HOSPITAL_COMMUNITY): Payer: Self-pay

## 2014-03-25 DIAGNOSIS — O2390 Unspecified genitourinary tract infection in pregnancy, unspecified trimester: Secondary | ICD-10-CM | POA: Diagnosis not present

## 2014-03-25 DIAGNOSIS — O0930 Supervision of pregnancy with insufficient antenatal care, unspecified trimester: Secondary | ICD-10-CM | POA: Insufficient documentation

## 2014-03-25 DIAGNOSIS — O99330 Smoking (tobacco) complicating pregnancy, unspecified trimester: Secondary | ICD-10-CM | POA: Insufficient documentation

## 2014-03-25 DIAGNOSIS — O210 Mild hyperemesis gravidarum: Secondary | ICD-10-CM | POA: Diagnosis not present

## 2014-03-25 DIAGNOSIS — Z87891 Personal history of nicotine dependence: Secondary | ICD-10-CM | POA: Diagnosis not present

## 2014-03-25 DIAGNOSIS — R109 Unspecified abdominal pain: Secondary | ICD-10-CM | POA: Insufficient documentation

## 2014-03-25 DIAGNOSIS — N390 Urinary tract infection, site not specified: Secondary | ICD-10-CM | POA: Insufficient documentation

## 2014-03-25 LAB — CBC
HCT: 34.2 % — ABNORMAL LOW (ref 36.0–46.0)
HEMOGLOBIN: 11.7 g/dL — AB (ref 12.0–15.0)
MCH: 28.6 pg (ref 26.0–34.0)
MCHC: 34.2 g/dL (ref 30.0–36.0)
MCV: 83.6 fL (ref 78.0–100.0)
Platelets: 318 10*3/uL (ref 150–400)
RBC: 4.09 MIL/uL (ref 3.87–5.11)
RDW: 13.9 % (ref 11.5–15.5)
WBC: 12 10*3/uL — AB (ref 4.0–10.5)

## 2014-03-25 LAB — URINALYSIS, ROUTINE W REFLEX MICROSCOPIC
BILIRUBIN URINE: NEGATIVE
Glucose, UA: NEGATIVE mg/dL
Ketones, ur: 15 mg/dL — AB
NITRITE: POSITIVE — AB
PH: 6 (ref 5.0–8.0)
PROTEIN: NEGATIVE mg/dL
Specific Gravity, Urine: 1.02 (ref 1.005–1.030)
Urobilinogen, UA: 0.2 mg/dL (ref 0.0–1.0)

## 2014-03-25 LAB — URINE MICROSCOPIC-ADD ON

## 2014-03-25 LAB — HCG, QUANTITATIVE, PREGNANCY: HCG, BETA CHAIN, QUANT, S: 63177 m[IU]/mL — AB (ref <5)

## 2014-03-25 LAB — POCT PREGNANCY, URINE: Preg Test, Ur: POSITIVE — AB

## 2014-03-25 MED ORDER — DOXYLAMINE-PYRIDOXINE 10-10 MG PO TBEC: DELAYED_RELEASE_TABLET | ORAL | Status: DC

## 2014-03-25 MED ORDER — NITROFURANTOIN MONOHYD MACRO 100 MG PO CAPS: 100.0000 mg | ORAL_CAPSULE | Freq: Two times a day (BID) | ORAL | Status: DC

## 2014-03-25 MED ORDER — ONDANSETRON HCL 4 MG/2ML IJ SOLN: 4.0000 mg | Freq: Once | INTRAMUSCULAR | Status: DC

## 2014-03-25 MED ORDER — PROMETHAZINE HCL 12.5 MG PO TABS: 12.5000 mg | ORAL_TABLET | Freq: Four times a day (QID) | ORAL | Status: DC | PRN

## 2014-03-25 NOTE — MAU Note (Signed)
Lower abd pain "pinching feeling" for past 3 days, dysuria also for the past 3-4 days - has become progressively worse.  Pos HPT a week ago.  Occasional spotting.

## 2014-03-25 NOTE — MAU Provider Note (Signed)
First Provider Initiated Contact with Patient 03/25/14 2107      Chief Complaint:  Abdominal Pain and Dysuria   [name] is  28 y.o. [phone] at [redacted]w[redacted]d presents complaining of Abdominal Pain and Dysuria She has a "pinching feeling" suprapubically, getting progressively worse over the past 3-4 days.  Denies fever  Obstetrical/Gynecological History: OB History   Grav Para Term Preterm Abortions TAB SAB Ect Mult Living   6 3 3  0 2 0 2 0 0 3     Past Medical History: Past Medical History  Diagnosis Date  . Asthma   . Headache(784.0)     migraines  . Depression   . PIH (pregnancy induced hypertension)   . Late prenatal care     22wks  . Anemia 2007  . HPV (human papilloma virus) anogenital infection   . Recurrent UTI   . Abnormal Pap smear   . Chlamydia infection     has no taken treatment as of 01/03/2013  . SVD (spontaneous vaginal delivery) 01/08/2013    Past Surgical History: Past Surgical History  Procedure Laterality Date  . No past surgeries      Family History: Family History  Problem Relation Age of Onset  . Anesthesia problems Neg Hx   . Hypotension Neg Hx   . Malignant hyperthermia Neg Hx   . Pseudochol deficiency Neg Hx   . Heart disease Maternal Grandmother     clots in heart  . Stroke Maternal Grandmother   . Diabetes Mother   . Heart disease Maternal Grandfather     pacemaker  . Miscarriages / Stillbirths Paternal Aunt   . Stroke Paternal Grandmother     Social History: History  Substance Use Topics  . Smoking status: Current Every Day Smoker -- 0.25 packs/day for 5 years    Types: Cigarettes  . Smokeless tobacco: Never Used  . Alcohol Use: No    Allergies:  Allergies  Allergen Reactions  . Corn Syrup Anaphylaxis and Hives    Meds:  Prescriptions prior to admission  Medication Sig Dispense Refill  . acetaminophen (TYLENOL) 500 MG tablet Take 1,000 mg by mouth every 6 (six) hours as needed for mild pain or fever.       .  diphenhydrAMINE (BENADRYL) 25 MG tablet Take 25 mg by mouth every 6 (six) hours as needed for itching or allergies.      Marland Kitchen ibuprofen (ADVIL,MOTRIN) 200 MG tablet Take 800 mg by mouth every 6 (six) hours as needed for fever or moderate pain.       Marland Kitchen albuterol (PROVENTIL HFA;VENTOLIN HFA) 108 (90 BASE) MCG/ACT inhaler Inhale 2 puffs into the lungs every 6 (six) hours as needed for wheezing (for excercised-induced wheezing).  1 Inhaler  2    Review of Systems   Constitutional: Negative for fever and chills Eyes: Negative for visual disturbances Respiratory: Negative for shortness of breath, dyspnea Cardiovascular: Negative for chest pain or palpitations  Gastrointestinal: Negative for vomiting, diarrhea and constipation Genitourinary: Negative for dysuria and urgency.  Negative CVAT Musculoskeletal: Negative for back pain, joint pain, myalgias  Neurological: Negative for dizziness and headaches     Physical Exam  Blood pressure 114/60, pulse 90, temperature 98.5 F (36.9 C), temperature source Oral, resp. rate 18, height 5\' 6"  (1.676 m), weight 66.044 kg (145 lb 9.6 oz), last menstrual period 01/14/2014. GENERAL: Well-developed, well-nourished female in no acute distress.  LUNGS: Clear to auscultation bilaterally.  HEART: Regular rate and rhythm. ABDOMEN: Soft, nontender, nondistended,  gravid. No CVAT EXTREMITIES: Nontender, no edema, 2+ distal pulses. DTR's 2+    Labs: Results for orders placed during the hospital encounter of 03/25/14 (from the past 24 hour(s))  URINALYSIS, ROUTINE W REFLEX MICROSCOPIC   Collection Time    03/25/14  7:20 PM      Result Value Ref Range   Color, Urine YELLOW  YELLOW   APPearance CLEAR  CLEAR   Specific Gravity, Urine 1.020  1.005 - 1.030   pH 6.0  5.0 - 8.0   Glucose, UA NEGATIVE  NEGATIVE mg/dL   Hgb urine dipstick MODERATE (*) NEGATIVE   Bilirubin Urine NEGATIVE  NEGATIVE   Ketones, ur 15 (*) NEGATIVE mg/dL   Protein, ur NEGATIVE  NEGATIVE  mg/dL   Urobilinogen, UA 0.2  0.0 - 1.0 mg/dL   Nitrite POSITIVE (*) NEGATIVE   Leukocytes, UA LARGE (*) NEGATIVE  URINE MICROSCOPIC-ADD ON   Collection Time    03/25/14  7:20 PM      Result Value Ref Range   Squamous Epithelial / LPF RARE  RARE   WBC, UA TOO NUMEROUS TO COUNT  <3 WBC/hpf   RBC / HPF 21-50  <3 RBC/hpf   Bacteria, UA MANY (*) RARE  POCT PREGNANCY, URINE   Collection Time    03/25/14  7:32 PM      Result Value Ref Range   Preg Test, Ur POSITIVE (*) NEGATIVE  CBC   Collection Time    03/25/14  8:38 PM      Result Value Ref Range   WBC 12.0 (*) 4.0 - 10.5 K/uL   RBC 4.09  3.87 - 5.11 MIL/uL   Hemoglobin 11.7 (*) 12.0 - 15.0 g/dL   HCT 16.134.2 (*) 09.636.0 - 04.546.0 %   MCV 83.6  78.0 - 100.0 fL   MCH 28.6  26.0 - 34.0 pg   MCHC 34.2  30.0 - 36.0 g/dL   RDW 40.913.9  81.111.5 - 91.415.5 %   Platelets 318  150 - 400 K/uL  HCG, QUANTITATIVE, PREGNANCY   Collection Time    03/25/14  8:39 PM      Result Value Ref Range   hCG, Beta Chain, Quant, S 63177 (*) <5 mIU/mL   Imaging Studies:  No results found.  Assessment: [name] is  28 y.o. [phone] at 1711w0d presents with UTI, Nausea.  Plan: Treat UTI, culture urine.  Treat nausea.  Pt to come back if develops fever.  [facility]  [name] 8/10/20159:30 PM

## 2014-03-25 NOTE — Discharge Instructions (Signed)
Asymptomatic Bacteriuria Asymptomatic bacteriuria is the presence of a large number of bacteria in your urine without the usual symptoms of burning or frequent urination. The following conditions increase the risk of asymptomatic bacteriuria:  Diabetes mellitus.  Advanced age.  Pregnancy in the first trimester.  Kidney stones.  Kidney transplants.  Leaky kidney tube valve in young children (reflux). Treatment for this condition is not needed in most people and can lead to other problems such as too much yeast and growth of resistant bacteria. However, some people, such as pregnant women, do need treatment to prevent kidney infection. Asymptomatic bacteriuria in pregnancy is also associated with fetal growth restriction, premature labor, and newborn death. HOME CARE INSTRUCTIONS Monitor your condition for any changes. The following actions may help to relieve any discomfort you are feeling:  Drink enough water and fluids to keep your urine clear or pale yellow. Go to the bathroom more often to keep your bladder empty.  Keep the area around your vagina and rectum clean. Wipe yourself from front to back after urinating. SEEK IMMEDIATE MEDICAL CARE IF:  You develop signs of an infection such as:  Burning with urination.  Frequency of voiding.  Back pain.  Fever 100.4 or ABOVE!!!  You have blood in the urine.  You develop a fever. MAKE SURE YOU:  Understand these instructions.  Will watch your condition.  Will get help right away if you are not doing well or get worse. Document Released: 08/02/2005 Document Revised: 12/17/2013 Document Reviewed: 01/22/2013 [facility] Patient Information 2015 [place], [place]. This information is not intended to replace advice given to you by your health care provider. Make sure you discuss any questions you have with your health care provider.

## 2014-03-28 LAB — URINE CULTURE: Colony Count: >=100000

## 2014-04-03 ENCOUNTER — Other Ambulatory Visit: Payer: Self-pay | Admitting: Advanced Practice Midwife

## 2014-04-19 LAB — OB RESULTS CONSOLE RPR: RPR: NONREACTIVE

## 2014-04-19 LAB — OB RESULTS CONSOLE ABO/RH: RH Type: POSITIVE

## 2014-04-19 LAB — OB RESULTS CONSOLE ANTIBODY SCREEN: Antibody Screen: NEGATIVE

## 2014-04-19 LAB — OB RESULTS CONSOLE HIV ANTIBODY (ROUTINE TESTING): HIV: NONREACTIVE

## 2014-04-19 LAB — OB RESULTS CONSOLE HEPATITIS B SURFACE ANTIGEN: Hepatitis B Surface Ag: NEGATIVE

## 2014-04-19 LAB — OB RESULTS CONSOLE RUBELLA ANTIBODY, IGM: Rubella: IMMUNE

## 2014-06-01 ENCOUNTER — Encounter (HOSPITAL_COMMUNITY): Payer: Self-pay | Admitting: Emergency Medicine

## 2014-06-01 ENCOUNTER — Emergency Department (HOSPITAL_COMMUNITY)
Admission: EM | Admit: 2014-06-01 | Discharge: 2014-06-01 | Disposition: A | Discharge status: Home / Self Care | Payer: Medicaid Other | Attending: Emergency Medicine | Admitting: Emergency Medicine

## 2014-06-01 DIAGNOSIS — Z79899 Other long term (current) drug therapy: Secondary | ICD-10-CM | POA: Diagnosis not present

## 2014-06-01 DIAGNOSIS — Z8619 Personal history of other infectious and parasitic diseases: Secondary | ICD-10-CM | POA: Diagnosis not present

## 2014-06-01 DIAGNOSIS — Z862 Personal history of diseases of the blood and blood-forming organs and certain disorders involving the immune mechanism: Secondary | ICD-10-CM | POA: Insufficient documentation

## 2014-06-01 DIAGNOSIS — K0889 Other specified disorders of teeth and supporting structures: Secondary | ICD-10-CM

## 2014-06-01 DIAGNOSIS — Z8659 Personal history of other mental and behavioral disorders: Secondary | ICD-10-CM | POA: Insufficient documentation

## 2014-06-01 DIAGNOSIS — J45909 Unspecified asthma, uncomplicated: Secondary | ICD-10-CM | POA: Insufficient documentation

## 2014-06-01 DIAGNOSIS — K088 Other specified disorders of teeth and supporting structures: Secondary | ICD-10-CM | POA: Diagnosis not present

## 2014-06-01 DIAGNOSIS — Z720 Tobacco use: Secondary | ICD-10-CM | POA: Insufficient documentation

## 2014-06-01 DIAGNOSIS — Z87440 Personal history of urinary (tract) infections: Secondary | ICD-10-CM | POA: Diagnosis not present

## 2014-06-01 MED ORDER — PENICILLIN V POTASSIUM 500 MG PO TABS: 500.0000 mg | ORAL_TABLET | Freq: Four times a day (QID) | ORAL | Status: AC

## 2014-06-01 MED ORDER — TRAMADOL HCL 50 MG PO TABS: 50.0000 mg | ORAL_TABLET | Freq: Four times a day (QID) | ORAL | Status: DC | PRN

## 2014-06-01 NOTE — ED Provider Notes (Signed)
CSN: [id number]     Arrival date & time 06/01/14  1711 History  This chart was scribed for non-physician practitioner working with No att. providers found by Elveria Risingimelie Horne, ED Scribe. This patient was seen in room WTR8/WTR8 and the patient's care was started at 5:47 PM.   Chief Complaint  Patient presents with  . Dental Pain    two broken teeth causing increased pain over last 24 hours   The history is provided by the patient. No language interpreter was used.   HPI Comments: [name] is a 28 y.o. female who presents to the Emergency Department complaining of worsening dental pain. Pain attributed two broken teeth: upper left and lower left. Patient states pain started yesterday when the back lower left molar chipped. She reports it is difficult to chew food but it still able to swallow and breathe without difficulty. Has tried Tylenol with some relief. Denies fevers, chest pain, shortness of breath, abdominal pain, numbness or tingling, changes in vision, headache, facial pain. She denies any allergies but states that she is 5 months pregnant and cannot have ibuprofen. Says she [facility]    Past Medical History  Diagnosis Date  . Asthma   . Headache(784.0)     migraines  . Depression   . PIH (pregnancy induced hypertension)   . Late prenatal care     22wks  . Anemia 2007  . HPV (human papilloma virus) anogenital infection   . Recurrent UTI   . Abnormal Pap smear   . Chlamydia infection     has no taken treatment as of 01/03/2013  . SVD (spontaneous vaginal delivery) 01/08/2013   Past Surgical History  Procedure Laterality Date  . No past surgeries     Family History  Problem Relation Age of Onset  . Anesthesia problems Neg Hx   . Hypotension Neg Hx   . Malignant hyperthermia Neg Hx   . Pseudochol deficiency Neg Hx   . Heart disease Maternal Grandmother     clots in heart  . Stroke Maternal  Grandmother   . Diabetes Mother   . Heart disease Maternal Grandfather     pacemaker  . Miscarriages / Stillbirths Paternal Aunt   . Stroke Paternal Grandmother    History  Substance Use Topics  . Smoking status: Current Some Day Smoker -- 0.25 packs/day for 5 years    Types: Cigarettes  . Smokeless tobacco: Never Used  . Alcohol Use: No   OB History   Grav Para Term Preterm Abortions TAB SAB Ect Mult Living   6 3 3  0 2 0 2 0 0 3     Review of Systems  Constitutional: Negative for fever and chills.  HENT: Positive for dental problem. Negative for sore throat and trouble swallowing.        Tooth pain  Respiratory: Negative for shortness of breath.   Cardiovascular: Negative for chest pain.  Skin: Negative for rash.  Neurological: Negative for headaches.  All other systems reviewed and are negative.   Allergies  Corn syrup  Home Medications   Prior to Admission medications   Medication Sig Start Date End Date Taking? Authorizing Provider  acetaminophen (TYLENOL) 500 MG tablet Take 1,000 mg by mouth every 6 (six) hours as needed for mild pain or fever.     Historical Provider, MD  albuterol (PROVENTIL HFA;VENTOLIN HFA) 108 (90 BASE) MCG/ACT inhaler Inhale 2 puffs into the  lungs every 6 (six) hours as needed for wheezing (for excercised-induced wheezing). 04/11/13   Richarda OverlieNayana Abrol, MD  diphenhydrAMINE (BENADRYL) 25 MG tablet Take 25 mg by mouth every 6 (six) hours as needed for itching or allergies.    Historical Provider, MD  Doxylamine-Pyridoxine 10-10 MG TBEC 2 PO qhs; may take 1po in am and 1po in afternoon prn nausea 03/25/14   Jacklyn ShellFrances Cresenzo-Dishmon, CNM  nitrofurantoin, macrocrystal-monohydrate, (MACROBID) 100 MG capsule Take 1 capsule (100 mg total) by mouth 2 (two) times daily. 03/25/14   Jacklyn ShellFrances Cresenzo-Dishmon, CNM  promethazine (PHENERGAN) 12.5 MG tablet Take 1 tablet (12.5 mg total) by mouth every 6 (six) hours as needed for nausea or vomiting. 03/25/14   Jacklyn ShellFrances  Cresenzo-Dishmon, CNM   Triage Vitals: BP 104/56  Pulse 93  Temp(Src) 98.4 F (36.9 C) (Oral)  Resp 18  Wt 168 lb (76.204 kg)  SpO2 100%  LMP 01/14/2014  Physical Exam  Nursing note and vitals reviewed. Constitutional: She is oriented to person, place, and time. She appears well-developed and well-nourished. No distress.  HENT:  Head: Normocephalic and atraumatic.  Poor dentition. Missing lower incisors. No gingival erythema or swelling. No tonsillar swelling. Uvula midline. No evidence of fluctuance or drainable abscess. No submandibular tenderness. No overt dental fractures noted.  Eyes: EOM are normal. Right eye exhibits no discharge. Left eye exhibits no discharge. No scleral icterus.  Neck: Neck supple. No tracheal deviation present.  Cardiovascular: Normal rate.   Pulmonary/Chest: Effort normal. No respiratory distress.  Musculoskeletal: Normal range of motion.  Neurological: She is alert and oriented to person, place, and time.  Skin: Skin is warm and dry.  Psychiatric: She has a normal mood and affect. Her behavior is normal.    ED Course  Procedures (including critical care time)  COORDINATION OF CARE: 5:47 PM- Discussed treatment plan with patient at bedside and patient agreed to plan.   Labs Review Labs Reviewed - No data to display  Imaging Review No results found.   EKG Interpretation None      MDM  Vitals stable - WNL -afebrile Pt resting comfortably in ED. PE shows no evidence of acute or emergent pathology.   Will DC with ABX and pain control. May f/u with Dentist for regularly scheduled appt. Discussed f/u with PCP and return precautions, pt very amenable to plan. Discussed use of tramadol during pregnancy, pregnancy class C. Use Tylenol primarily.   Final diagnoses:  Pain, dental      I personally performed the services described in this documentation, which was scribed in my presence. The recorded information has been reviewed and is  accurate.    Sharlene MottsBenjamin W Shannyn Jankowiak, PA-C 06/02/14 [phone]

## 2014-06-01 NOTE — Discharge Instructions (Signed)

## 2014-06-01 NOTE — ED Notes (Signed)
Pt reports 2 cracked teeth in l/upper and l/lower mouth. Pain increased over last 24 hours

## 2014-06-02 NOTE — ED Provider Notes (Signed)
Medical screening examination/treatment/procedure(s) were performed by non-physician practitioner and as supervising physician I was immediately available for consultation/collaboration.   EKG Interpretation None        Toy CookeyMegan Docherty, MD 06/02/14 1627

## 2014-06-08 ENCOUNTER — Encounter (HOSPITAL_COMMUNITY): Payer: Self-pay | Admitting: Emergency Medicine

## 2014-06-08 ENCOUNTER — Emergency Department (HOSPITAL_COMMUNITY)
Admission: EM | Admit: 2014-06-08 | Discharge: 2014-06-09 | Disposition: A | Discharge status: Home / Self Care | Payer: Medicaid Other | Attending: Emergency Medicine | Admitting: Emergency Medicine

## 2014-06-08 DIAGNOSIS — F329 Major depressive disorder, single episode, unspecified: Secondary | ICD-10-CM | POA: Insufficient documentation

## 2014-06-08 DIAGNOSIS — Z3A20 20 weeks gestation of pregnancy: Secondary | ICD-10-CM | POA: Diagnosis not present

## 2014-06-08 DIAGNOSIS — O99332 Smoking (tobacco) complicating pregnancy, second trimester: Secondary | ICD-10-CM | POA: Diagnosis not present

## 2014-06-08 DIAGNOSIS — Z8619 Personal history of other infectious and parasitic diseases: Secondary | ICD-10-CM | POA: Insufficient documentation

## 2014-06-08 DIAGNOSIS — Z8679 Personal history of other diseases of the circulatory system: Secondary | ICD-10-CM | POA: Diagnosis not present

## 2014-06-08 DIAGNOSIS — J45909 Unspecified asthma, uncomplicated: Secondary | ICD-10-CM | POA: Diagnosis not present

## 2014-06-08 DIAGNOSIS — O26812 Pregnancy related exhaustion and fatigue, second trimester: Secondary | ICD-10-CM | POA: Diagnosis present

## 2014-06-08 DIAGNOSIS — K088 Other specified disorders of teeth and supporting structures: Secondary | ICD-10-CM | POA: Insufficient documentation

## 2014-06-08 DIAGNOSIS — O99342 Other mental disorders complicating pregnancy, second trimester: Secondary | ICD-10-CM | POA: Insufficient documentation

## 2014-06-08 DIAGNOSIS — O99012 Anemia complicating pregnancy, second trimester: Secondary | ICD-10-CM | POA: Insufficient documentation

## 2014-06-08 DIAGNOSIS — K0889 Other specified disorders of teeth and supporting structures: Secondary | ICD-10-CM

## 2014-06-08 DIAGNOSIS — Z79899 Other long term (current) drug therapy: Secondary | ICD-10-CM | POA: Insufficient documentation

## 2014-06-08 DIAGNOSIS — Z87448 Personal history of other diseases of urinary system: Secondary | ICD-10-CM | POA: Insufficient documentation

## 2014-06-08 DIAGNOSIS — F17200 Nicotine dependence, unspecified, uncomplicated: Secondary | ICD-10-CM | POA: Diagnosis not present

## 2014-06-08 MED ORDER — TRAMADOL HCL 50 MG PO TABS: 50.0000 mg | ORAL_TABLET | Freq: Four times a day (QID) | ORAL | Status: DC | PRN

## 2014-06-08 NOTE — Discharge Instructions (Signed)
If you were given medicines take as directed.  If you are on coumadin or contraceptives realize their levels and effectiveness is altered by many different medicines.  If you have any reaction (rash, tongues swelling, other) to the medicines stop taking and see a physician.   Please follow up as directed and return to the ER or see a physician for new or worsening symptoms.  Thank you. Filed Vitals:   06/08/14 2248  BP: 117/60  Pulse: 94  Temp: 98 F (36.7 C)  Resp: 18  Height: 5\' 7"  (1.702 m)  Weight: 160 lb (72.576 kg)  SpO2: 100%    Emergency Department Resource Guide 1) Find a Doctor and Pay Out of Pocket Although you won't have to find out who is covered by your insurance plan, it is a good idea to ask around and get recommendations. You will then need to call the office and see if the doctor you have chosen will accept you as a new patient and what types of options they offer for patients who are self-pay. Some doctors offer discounts or will set up payment plans for their patients who do not have insurance, but you will need to ask so you aren't surprised when you get to your appointment.  2) Contact Your Local Health Department Not all health departments have doctors that can see patients for sick visits, but many do, so it is worth a call to see if yours does. If you don't know where your local health department is, you can check in your phone book. The CDC also has a tool to help you locate your state's health department, and many state websites also have listings of all of their local health departments.  3) Find a Walk-in Clinic If your illness is not likely to be very severe or complicated, you may want to try a walk in clinic. These are popping up all over the country in pharmacies, drugstores, and shopping centers. They're usually staffed by nurse practitioners or physician assistants that have been trained to treat common illnesses and complaints. They're usually fairly quick  and inexpensive. However, if you have serious medical issues or chronic medical problems, these are probably not your best option.  No Primary Care Doctor: - Call Health Connect at  [phone] - they can help you locate a primary care doctor that  accepts your insurance, provides certain services, etc. - Physician Referral Service- [phone]  Chronic Pain Problems: Organization         Address  Phone   Notes  [facility]  [phone]) [phone] Patients need to be referred by their primary care doctor.   Medication Assistance: Organization         Address  Phone   Notes  [facility] [address] [place], [place] [address] [phone]) [phone] --Must be a resident of [facility] -- Must have NO insurance coverage whatsoever (no Medicaid/ Medicare, etc.) -- The pt. MUST have a primary care doctor that directs their care regularly and follows them in the community   MedAssist  [phone]) [phone]   [employer]  [phone]    Agencies that provide inexpensive medical care: Organization         Address  Phone   Notes  [facility]  [phone]) [phone]   [facility]    [phone]) [phone]   [facility] [address] [place], [place] [address] [phone]  [facility] [address] [place], [place] [phone]   Planned Parenthood    [phone]   [facility]    [phone]   [facility]  [address], [place] Phone:  [phone], Fax:  [phone] Hours of Operation:  9 am - 6 pm, M-F.  Also accepts Medicaid/Medicare and self-pay.  [facility]  [address], [place] Phone: [phone], Fax: [phone]. Hours of Operation:  8:30 am - 5:30 pm, M-F.  Also accepts Medicaid and self-pay.  [facility] [place] [address], [place] Phone: [phone]    [facility] [address] Natasha Bence [place], [place] [phone], Ext. [phone] Mondays & Thursdays: 7-9 AM.  First 15 patients are seen on a first come, first serve basis.    Medicaid-accepting [facility] Providers:  Organization         Address  Phone   Notes  [facility] [address], [place] [phone] Also accepts self-pay patients.  [facility] [address] Laurell Josephs [place], [place]  [phone]   [facility] [address], [place] [phone]   [facility] [address], [place] [phone]   Renaye Rakers [address], [place]   [phone] Only accepts [place] Access [place] patients after they have their name applied to their card.   Self-Pay (no insurance) in [facility]:  Organization         Address  Phone   Notes  Sickle Cell Patients, [facility] [address] [place], [place] [phone]   [facility] [address] [place], [place] [phone]   [facility] [place]  [address], [place] [phone]   [facility]/Dr. Osei-Bonsu  [address], [place] or [address], [place] [phone] Phone number for both [place] and [place] locations is the same.  [facility] [address] [place] [phone]   [facility] [address] [place] or [address] [phone]   [facility] [address] [place] [phone], phone; [phone], fax Sees patients 1st and 3rd Saturday of every month.  Must not qualify for public or private insurance (i.e. Medicaid, Medicare, [place] Health Choice, Veterans' Benefits)  Household income should be no more than 200% of the poverty level The clinic cannot treat you if you are  pregnant or think you are pregnant  Sexually transmitted diseases are not treated at the clinic.    Dental Care: Organization         Address  Phone  Notes  [facility] [address] [place], [place] [phone] Accepts children up to age 103 who are enrolled in [place] or [place] Health Choice; pregnant women with a Medicaid card; and children who have applied for Medicaid or [place] Health Choice, but were declined, whose parents can pay a reduced fee at time of service.  [facility]  [address], [place] [phone] Accepts children up to age 84 who are enrolled in [place] or  Health Choice; pregnant women with a Medicaid card; and  children who have applied for Medicaid or [place] Health Choice, but were declined, whose parents can pay a reduced fee at time of service.  [facility]  [address], [place] [phone]) [phone] Patients are seen by appointment only. Walk-ins are not accepted. [facility] will see patients 26 years of age and older. Monday - Tuesday (8am-5pm) Most Wednesdays (8:30-5pm) $30 per visit, cash only  [facility]  [address], [facility] [phone]) [phone] Patients are seen by appointment only. Walk-ins are not accepted. [facility] will see patients 26 years of age and older. One Wednesday Evening (Monthly: Volunteer Based).  $30 per visit, cash only  [facility]  [phone] for adults; Children under age 744, call Graduate Pediatric Dentistry at [phone]. Children aged 544-14, please call [phone] to request a pediatric application.  Dental services are provided in all areas of dental care including fillings, crowns and bridges, complete and partial dentures, implants, gum treatment, root canals, and extractions. Preventive care is also provided. Treatment is provided to  both adults and children. Patients are selected via a lottery and there is often a waiting list.   [facility] [address], [place]  [phone]) [phone] [email or web address]   [facility] [address], [place], [place] [phone], Ext. [phone] Second and Fourth Thursday of each month, opens at 6:30 AM; Clinic ends at 9 AM.  Patients are seen on a first-come first-served basis, and a limited number are seen during each clinic.   [facility]  [address], [place], [place] [phone]) [phone]   Eligibility Requirements You must have lived in [place], or [place] counties for at least the last three months.   You cannot be eligible for state or federal sponsored [place] Cityhealthcare insurance, including CIGNAVeterans Administration, [place]icaid, or [employer]Medicare.   You generally cannot be eligible for healthcare insurance through your employer.    How to apply: Eligibility screenings are held every Tuesday and Wednesday afternoon from 1:00 pm until 4:00 pm. You do not need an appointment for the interview!  [facility] [address], [place], [place] [phone]   [facility]  [phone]   [facility]  [phone]   [facility]  [phone]    Behavioral Health Resources in the Community: Intensive Outpatient Programs Organization         Address  Phone  Notes  [facility] [address], [place], [place] [phone]   [facility] Outpatient [address], [place], [place] [phone]   [facility] [address], [place], [place]  [phone]   [facility] [address],  [place], [place] [phone] or [phone]   Substance Abuse Resources Organization         Address  Phone  Notes  [facility] Drug Services  [phone]   [facility]  [phone]   [facility]   [phone]   [facility]  [phone]   Residential & Outpatient Substance Abuse Program  [phone]   Psychological Services Organization         Address  Phone  Notes  [facility]  [phone]- [phone]   [facility]  [phone]   [facility] [address], [place] [phone] or [phone]    Mobile Crisis Teams Organization  Address  Phone  Notes  [facility], Mobile Crisis Care Unit  [phone]   [facility]  [address] [place], [place] [phone]   [facility] [address] [place] [phone]    Self-Help/Support Groups Organization         Address  Phone             Notes  [facility] [place] - variety of support groups  [phone]- I7437963 Call for more information  Narcotics Anonymous (NA), [facility] [address], [employer] [place]  2 meetings at this location   Statistician         Address  Phone  Notes  [facility] [address],    [place]  [phone]   [facility]  [address], [place] [address], [place], [place] [phone]   [facility] [address] [place], [place] [phone] Admissions: 8am-3pm M-F  [facility] [address]    [place], [place] [phone]   [facility] [address] [place], [place], [place] [phone]   [facility] [address],  [place], [place] [phone]   [facility] - Intensive Outpatient [address], [place], [place] [phone]   [facility] ([facility] [address]  [place], [place] [phone] or [phone]   [facility] ([facility]) [address], [place], [place] [phone] Accepts Medicaid  [facility] [address],  [place] [phone] Substance Abuse/Addiction Treatment   [facility] Organization         Address  Phone  Notes  [facility]  [phone]   Angie Fava, PhD [address] Ervin Kn[place], [place]   [phone] or [phone]   [facility] Behavioral   [address] [place], [place] [phone]   [facility] [address] [place], [place] [phone] Insurance/Medicaid/sponsorship through [facility] and Families [address]                                    [place], [place] [phone] Therapy/tele-psych/case  [facility] [address], [place] [phone]    Dr. Lolly Mustache  [phone]   [facility]  [employer] [facility] Dept. 1) [address] [place] 2) [address] [place] 3)  [address], [place] [phone]  [phone]   [facility] Child Abuse Hotline 979-101-2789 or [phone] (After Hours)

## 2014-06-08 NOTE — ED Notes (Signed)
[redacted] weeks pregnant. C/o continued dental pain. Ongoing for ~ 1week. Still taking PCN. Rates pain 88/10. Pinpoints pain to L lower molars, radiates to upper L cheek/maxilla. (Denies: drainage, fever or nv). "hard to swallow". Also reports triggering HA. Also taking tramadol.

## 2014-06-08 NOTE — ED Notes (Signed)
Pt seen by EDP Dr. Jodi MourningZavitz in triage prior to triage or RN assessment. See MD notes, pending orders. Pt alert, NAD, calm, interactive. Here for dental pain.

## 2014-06-09 NOTE — ED Provider Notes (Signed)
CSN: [id number]     Arrival date & time 06/08/14  2213 History   First MD Initiated Contact with Patient 06/08/14 2318     Chief Complaint  Patient presents with  . Dental Pain     (Consider location/radiation/quality/duration/timing/severity/associated sxs/prior Treatment) HPI Comments: 28 year old female with history of asthma, currently pregnant [redacted] weeks presents with left lower dental discomfort gradually worsening for 1 week. Patient trying Tylenol with mild improvement. Worse with palpation.  Patient is a 28 y.o. female presenting with tooth pain. The history is provided by the patient.  Dental Pain Associated symptoms: no fever and no neck pain     Past Medical History  Diagnosis Date  . Asthma   . Headache(784.0)     migraines  . Depression   . PIH (pregnancy induced hypertension)   . Late prenatal care     22wks  . Anemia 2007  . HPV (human papilloma virus) anogenital infection   . Recurrent UTI   . Abnormal Pap smear   . Chlamydia infection     has no taken treatment as of 01/03/2013  . SVD (spontaneous vaginal delivery) 01/08/2013   Past Surgical History  Procedure Laterality Date  . No past surgeries     Family History  Problem Relation Age of Onset  . Anesthesia problems Neg Hx   . Hypotension Neg Hx   . Malignant hyperthermia Neg Hx   . Pseudochol deficiency Neg Hx   . Heart disease Maternal Grandmother     clots in heart  . Stroke Maternal Grandmother   . Diabetes Mother   . Heart disease Maternal Grandfather     pacemaker  . Miscarriages / Stillbirths Paternal Aunt   . Stroke Paternal Grandmother    History  Substance Use Topics  . Smoking status: Current Some Day Smoker -- 0.25 packs/day for 5 years    Types: Cigarettes  . Smokeless tobacco: Never Used  . Alcohol Use: No   OB History   Grav Para Term Preterm Abortions TAB SAB Ect Mult Living   6 3 3  0 2 0 2 0 0 3     Review of Systems  Constitutional: Negative for fever.  HENT:  Positive for dental problem.   Musculoskeletal: Negative for neck pain.  Skin: Negative for rash.      Allergies  Corn syrup  Home Medications   Prior to Admission medications   Medication Sig Start Date End Date Taking? Authorizing Provider  acetaminophen (TYLENOL) 500 MG tablet Take 1,000 mg by mouth every 6 (six) hours as needed for mild pain or fever (toothache).     Historical Provider, MD  diphenhydrAMINE (BENADRYL) 25 MG tablet Take 25 mg by mouth every 6 (six) hours as needed for itching or allergies (allergies).     Historical Provider, MD  Prenatal Vit-Fe Fumarate-FA (PRENATAL MULTIVITAMIN) TABS tablet Take 1 tablet by mouth 2 (two) times daily.    Historical Provider, MD  promethazine (PHENERGAN) 12.5 MG tablet Take 12.5 mg by mouth every 4 (four) hours as needed for nausea or vomiting (nausea).    Historical Provider, MD  traMADol (ULTRAM) 50 MG tablet Take 1 tablet (50 mg total) by mouth every 6 (six) hours as needed. 06/01/14   Earle GellBenjamin W Cartner, PA-C  traMADol (ULTRAM) 50 MG tablet Take 1 tablet (50 mg total) by mouth every 6 (six) hours as needed. 06/08/14   Mackenzie SkeensJoshua M Aleph Nickson, MD   BP 110/52  Pulse 86  Temp(Src) 98.2 F (36.8 C) (  Oral)  Resp 18  Ht 5\' 7"  (1.702 m)  Wt 160 lb (72.576 kg)  BMI 25.05 kg/m2  SpO2 100%  LMP 01/14/2014 Physical Exam  Nursing note and vitals reviewed. Constitutional: She appears well-developed and well-nourished. No distress.  HENT:  Head: Normocephalic and atraumatic.  Patient has mild tenderness left lower posterior gingiva, no abscess palpated, no trismus, no neck or tongue swelling.  Eyes: Conjunctivae are normal. Pupils are equal, round, and reactive to light.  Neck: Normal range of motion. Neck supple.    ED Course  Procedures (including critical care time) Labs Review Labs Reviewed - No data to display  Imaging Review No results found.   EKG Interpretation None      MDM   Final diagnoses:  Pain, dental    Patient with worsening dental pain. Discussed outpatient follow-up with a dentist, pain control. No signs of emergent ENT infection in ED.  Results and differential diagnosis were discussed with the patient/parent/guardian. Close follow up outpatient was discussed, comfortable with the plan.   Medications - No data to display  Filed Vitals:   06/08/14 2248 06/08/14 2358  BP: 117/60 110/52  Pulse: 94 86  Temp: 98 F (36.7 C) 98.2 F (36.8 C)  TempSrc:  Oral  Resp: 18 18  Height: 5\' 7"  (1.702 m)   Weight: 160 lb (72.576 kg)   SpO2: 100% 100%    Final diagnoses:  Pain, dental        Mackenzie SkeensJoshua M Mackenzie Feltes, MD 06/09/14 [phone]

## 2014-06-10 LAB — OB RESULTS CONSOLE GC/CHLAMYDIA
CHLAMYDIA, DNA PROBE: NEGATIVE
Gonorrhea: NEGATIVE

## 2014-06-17 ENCOUNTER — Encounter (HOSPITAL_COMMUNITY): Payer: Self-pay | Admitting: Emergency Medicine

## 2014-06-26 ENCOUNTER — Emergency Department (HOSPITAL_COMMUNITY): Payer: Medicaid Other

## 2014-06-26 ENCOUNTER — Encounter (HOSPITAL_COMMUNITY): Payer: Self-pay | Admitting: *Deleted

## 2014-06-26 ENCOUNTER — Emergency Department (HOSPITAL_COMMUNITY)
Admission: EM | Admit: 2014-06-26 | Discharge: 2014-06-26 | Disposition: A | Discharge status: Home / Self Care | Payer: Medicaid Other | Attending: Emergency Medicine | Admitting: Emergency Medicine

## 2014-06-26 DIAGNOSIS — R05 Cough: Secondary | ICD-10-CM

## 2014-06-26 DIAGNOSIS — Z79899 Other long term (current) drug therapy: Secondary | ICD-10-CM | POA: Diagnosis not present

## 2014-06-26 DIAGNOSIS — O99342 Other mental disorders complicating pregnancy, second trimester: Secondary | ICD-10-CM | POA: Insufficient documentation

## 2014-06-26 DIAGNOSIS — Z87440 Personal history of urinary (tract) infections: Secondary | ICD-10-CM | POA: Insufficient documentation

## 2014-06-26 DIAGNOSIS — O132 Gestational [pregnancy-induced] hypertension without significant proteinuria, second trimester: Secondary | ICD-10-CM | POA: Diagnosis not present

## 2014-06-26 DIAGNOSIS — F17210 Nicotine dependence, cigarettes, uncomplicated: Secondary | ICD-10-CM | POA: Diagnosis not present

## 2014-06-26 DIAGNOSIS — F329 Major depressive disorder, single episode, unspecified: Secondary | ICD-10-CM | POA: Diagnosis not present

## 2014-06-26 DIAGNOSIS — Z8619 Personal history of other infectious and parasitic diseases: Secondary | ICD-10-CM | POA: Diagnosis not present

## 2014-06-26 DIAGNOSIS — O99332 Smoking (tobacco) complicating pregnancy, second trimester: Secondary | ICD-10-CM | POA: Diagnosis not present

## 2014-06-26 DIAGNOSIS — N3090 Cystitis, unspecified without hematuria: Secondary | ICD-10-CM

## 2014-06-26 DIAGNOSIS — D649 Anemia, unspecified: Secondary | ICD-10-CM | POA: Insufficient documentation

## 2014-06-26 DIAGNOSIS — Z3A23 23 weeks gestation of pregnancy: Secondary | ICD-10-CM | POA: Insufficient documentation

## 2014-06-26 DIAGNOSIS — J069 Acute upper respiratory infection, unspecified: Secondary | ICD-10-CM | POA: Diagnosis not present

## 2014-06-26 DIAGNOSIS — O99512 Diseases of the respiratory system complicating pregnancy, second trimester: Secondary | ICD-10-CM | POA: Insufficient documentation

## 2014-06-26 DIAGNOSIS — O99012 Anemia complicating pregnancy, second trimester: Secondary | ICD-10-CM | POA: Diagnosis not present

## 2014-06-26 DIAGNOSIS — O2312 Infections of bladder in pregnancy, second trimester: Secondary | ICD-10-CM | POA: Insufficient documentation

## 2014-06-26 DIAGNOSIS — J45909 Unspecified asthma, uncomplicated: Secondary | ICD-10-CM | POA: Insufficient documentation

## 2014-06-26 DIAGNOSIS — R059 Cough, unspecified: Secondary | ICD-10-CM

## 2014-06-26 LAB — URINALYSIS, ROUTINE W REFLEX MICROSCOPIC
BILIRUBIN URINE: NEGATIVE
Glucose, UA: NEGATIVE mg/dL
Ketones, ur: NEGATIVE mg/dL
Nitrite: NEGATIVE
PH: 7 (ref 5.0–8.0)
Protein, ur: NEGATIVE mg/dL
SPECIFIC GRAVITY, URINE: 1.009 (ref 1.005–1.030)
Urobilinogen, UA: 0.2 mg/dL (ref 0.0–1.0)

## 2014-06-26 LAB — URINE MICROSCOPIC-ADD ON

## 2014-06-26 MED ORDER — NITROFURANTOIN MONOHYD MACRO 100 MG PO CAPS: 100.0000 mg | ORAL_CAPSULE | Freq: Two times a day (BID) | ORAL | Status: DC

## 2014-06-26 NOTE — ED Provider Notes (Signed)
CSN: [id number]     Arrival date & time 06/26/14  1324 History   First MD Initiated Contact with Patient 06/26/14 1850     Chief Complaint  Patient presents with  . Cough  . Fever     (Consider location/radiation/quality/duration/timing/severity/associated sxs/prior Treatment) Patient is a 28 y.o. female presenting with cough and fever.  Cough Cough characteristics:  Productive Sputum characteristics:  Green Severity:  Moderate Onset quality:  Gradual Duration:  1 week Timing:  Constant Progression:  Unchanged Chronicity:  New Smoker: yes   Context: upper respiratory infection   Relieved by:  Nothing Worsened by:  Deep breathing Ineffective treatments:  None tried Associated symptoms: chills, fever (tmax 100.6) and sinus congestion   Associated symptoms: no shortness of breath and no sore throat   Fever Associated symptoms: chills and cough   Associated symptoms: no sore throat     Past Medical History  Diagnosis Date  . Asthma   . Headache(784.0)     migraines  . Depression   . PIH (pregnancy induced hypertension)   . Late prenatal care     22wks  . Anemia 2007  . HPV (human papilloma virus) anogenital infection   . Recurrent UTI   . Abnormal Pap smear   . Chlamydia infection     has no taken treatment as of 01/03/2013  . SVD (spontaneous vaginal delivery) 01/08/2013   Past Surgical History  Procedure Laterality Date  . No past surgeries     Family History  Problem Relation Age of Onset  . Anesthesia problems Neg Hx   . Hypotension Neg Hx   . Malignant hyperthermia Neg Hx   . Pseudochol deficiency Neg Hx   . Heart disease Maternal Grandmother     clots in heart  . Stroke Maternal Grandmother   . Diabetes Mother   . Heart disease Maternal Grandfather     pacemaker  . Miscarriages / Stillbirths Paternal Aunt   . Stroke Paternal Grandmother    History  Substance Use Topics  . Smoking status: Current Some Day Smoker -- 0.25 packs/day for 5 years   Types: Cigarettes  . Smokeless tobacco: Never Used  . Alcohol Use: No   OB History    Gravida Para Term Preterm AB TAB SAB Ectopic Multiple Living   6 3 3  0 2 0 2 0 0 3     Review of Systems  Constitutional: Positive for fever (tmax 100.6) and chills.  HENT: Negative for sore throat.   Respiratory: Positive for cough. Negative for shortness of breath.   All other systems reviewed and are negative.     Allergies  Corn syrup  Home Medications   Prior to Admission medications   Medication Sig Start Date End Date Taking? Authorizing Provider  acetaminophen (TYLENOL) 500 MG tablet Take 1,000 mg by mouth every 6 (six) hours as needed for mild pain or fever (toothache).    Yes Historical Provider, MD  albuterol (PROVENTIL HFA;VENTOLIN HFA) 108 (90 BASE) MCG/ACT inhaler Inhale 2 puffs into the lungs every 6 (six) hours as needed for wheezing or shortness of breath.   Yes Historical Provider, MD  Butalbital-APAP-Caffeine 50-300-40 MG CAPS Take 1 tablet by mouth daily as needed. 05/24/14  Yes Historical Provider, MD  chlorhexidine (PERIDEX) 0.12 % solution every morning. 04/24/14  Yes Historical Provider, MD  diphenhydrAMINE (BENADRYL) 25 MG tablet Take 25 mg by mouth every 6 (six) hours as needed for itching or allergies (allergies).    Yes Historical Provider,  MD  Prenatal Vit-Fe Fumarate-FA (PRENATAL MULTIVITAMIN) TABS tablet Take 1 tablet by mouth 2 (two) times daily.   Yes Historical Provider, MD  promethazine (PHENERGAN) 12.5 MG tablet Take 12.5 mg by mouth every 4 (four) hours as needed for nausea or vomiting (nausea).   Yes Historical Provider, MD  sertraline (ZOLOFT) 50 MG tablet Take 50 mg by mouth daily. 06/12/14  Yes Historical Provider, MD  nitrofurantoin, macrocrystal-monohydrate, (MACROBID) 100 MG capsule Take 1 capsule (100 mg total) by mouth 2 (two) times daily. 06/26/14   Mirian MoMatthew Gentry, MD  traMADol (ULTRAM) 50 MG tablet Take 1 tablet (50 mg total) by mouth every 6 (six) hours  as needed. 06/01/14   Earle GellBenjamin W Cartner, PA-C  traMADol (ULTRAM) 50 MG tablet Take 1 tablet (50 mg total) by mouth every 6 (six) hours as needed. 06/08/14   Enid SkeensJoshua M Zavitz, MD   BP 111/62 mmHg  Pulse 75  Temp(Src) 98.3 F (36.8 C) (Oral)  Resp 18  SpO2 98%  LMP 01/14/2014 Physical Exam  Constitutional: She is oriented to person, place, and time. She appears well-developed and well-nourished.  HENT:  Head: Normocephalic and atraumatic.  Right Ear: External ear normal.  Left Ear: External ear normal.  Eyes: Conjunctivae and EOM are normal. Pupils are equal, round, and reactive to light.  Neck: Normal range of motion. Neck supple.  Cardiovascular: Normal rate, regular rhythm, normal heart sounds and intact distal pulses.   Pulmonary/Chest: Effort normal and breath sounds normal.  Abdominal: Soft. Bowel sounds are normal. There is no tenderness.  Musculoskeletal: Normal range of motion.  Neurological: She is alert and oriented to person, place, and time.  Skin: Skin is warm and dry.  Vitals reviewed.   ED Course  Procedures (including critical care time) Labs Review Labs Reviewed  URINALYSIS, ROUTINE W REFLEX MICROSCOPIC - Abnormal; Notable for the following:    APPearance CLOUDY (*)    Hgb urine dipstick SMALL (*)    Leukocytes, UA LARGE (*)    All other components within normal limits  URINE MICROSCOPIC-ADD ON - Abnormal; Notable for the following:    Squamous Epithelial / LPF MANY (*)    Bacteria, UA FEW (*)    All other components within normal limits    Imaging Review Dg Chest 2 View  06/26/2014   CLINICAL DATA:  Productive cough with green sputum, fever for 3 days.  EXAM: CHEST  2 VIEW  COMPARISON:  10/15/2011  FINDINGS: Mild peribronchial thickening. Heart and mediastinal contours are within normal limits. No focal opacities or effusions. No acute bony abnormality.  IMPRESSION: Mild bronchitic changes.   Electronically Signed   By: Charlett NoseKevin  Dover M.D.   On: 06/26/2014  20:54     EKG Interpretation   Date/Time:  Wednesday June 26 2014 19:05:51 EST Ventricular Rate:  74 PR Interval:  134 QRS Duration: 92 QT Interval:  389 QTC Calculation: 432 R Axis:   27 Text Interpretation:  Sinus rhythm Baseline wander in lead(s) II III aVF  No significant change since last tracing Confirmed by Mirian MoGentry, Matthew  [phone]([id number]) on 06/26/2014 7:37:14 PM      MDM   Final diagnoses:  Cough  Upper respiratory infection  Cystitis    28 y.o. female with pertinent PMH of current pregnancy at ~23 weeks, asthma presents with cough as described above with fever.  On arrival vitals and physical exam as above.  CXR obtained and significant for likely viral process.  UA with signs of infection and pt  has dysuria.  Will treat with macrobid.  Stable to dc home with PCP fu.      1. Upper respiratory infection   2. Cough   3. Cystitis         Mirian MoMatthew Gentry, MD 06/26/14 [phone]

## 2014-06-26 NOTE — Progress Notes (Signed)
ED CM noted patient to not have a PCP listed in record. Patient has had 3 ED visits in the past 6 months. Patient present to [facility] ED with complaints of productive cough with green mucoid drainage. Patient is [redacted] weeks pregnant, she has Medicaid. Met with patient at bedside, confirmed information. Patient states that she is in the process of having Medicaid transferred to [facility] clinic. Suggested that patient contact her DSS worker to assist with process, patient agreeable. ED evaluation still in progress. No further ED CM needs identified.

## 2014-06-26 NOTE — ED Notes (Signed)
EDP spoke with patient risk and benefits of chest x-ray. Patient does want the chest x-ray. Radiology notified.

## 2014-06-26 NOTE — ED Notes (Signed)
Pt reports congestion in head x4 days with progression to congestion in chest.  Pt reports hacking up "green gooey stuff." Pt has tried robitussin, dayquil and nyquil with no relief.  Pt reports "i am coughing to the point where i feel like my ribs are gonna come out." Pt is [redacted] weeks pregnant with twins.

## 2014-06-26 NOTE — ED Notes (Signed)
Pt reports being approx 6 months pregnant. Having productive cough with green sputum and fever x 3 days.

## 2014-06-26 NOTE — ED Notes (Signed)
EKG completed at given to EDP. 

## 2014-06-26 NOTE — Discharge Instructions (Signed)
Cough, Adult ° A cough is a reflex that helps clear your throat and airways. It can help heal the body or may be a reaction to an irritated airway. A cough may only last 2 or 3 weeks (acute) or may last more than 8 weeks (chronic).  °CAUSES °Acute cough: °· Viral or bacterial infections. °Chronic cough: °· Infections. °· Allergies. °· Asthma. °· Post-nasal drip. °· Smoking. °· Heartburn or acid reflux. °· Some medicines. °· Chronic lung problems (COPD). °· Cancer. °SYMPTOMS  °· Cough. °· Fever. °· Chest pain. °· Increased breathing rate. °· High-pitched whistling sound when breathing (wheezing). °· Colored mucus that you cough up (sputum). °TREATMENT  °· A bacterial cough may be treated with antibiotic medicine. °· A viral cough must run its course and will not respond to antibiotics. °· Your caregiver may recommend other treatments if you have a chronic cough. °HOME CARE INSTRUCTIONS  °· Only take over-the-counter or prescription medicines for pain, discomfort, or fever as directed by your caregiver. Use cough suppressants only as directed by your caregiver. °· Use a cold steam vaporizer or humidifier in your bedroom or home to help loosen secretions. °· Sleep in a semi-upright position if your cough is worse at night. °· Rest as needed. °· Stop smoking if you smoke. °SEEK IMMEDIATE MEDICAL CARE IF:  °· You have pus in your sputum. °· Your cough starts to worsen. °· You cannot control your cough with suppressants and are losing sleep. °· You begin coughing up blood. °· You have difficulty breathing. °· You develop pain which is getting worse or is uncontrolled with medicine. °· You have a fever. °MAKE SURE YOU:  °· Understand these instructions. °· Will watch your condition. °· Will get help right away if you are not doing well or get worse. °Document Released: 01/29/2011 Document Revised: 10/25/2011 Document Reviewed: 01/29/2011 °ExitCare® Patient Information ©2015 [employer]. This information is not intended  to replace advice given to you by your health care provider. Make sure you discuss any questions you have with your health care provider. ° °Urinary Tract Infection °Urinary tract infections (UTIs) can develop anywhere along your urinary tract. Your urinary tract is your body's drainage system for removing wastes and extra water. Your urinary tract includes two kidneys, two ureters, a bladder, and a urethra. Your kidneys are a pair of bean-shaped organs. Each kidney is about the size of your fist. They are located below your ribs, one on each side of your spine. °CAUSES °Infections are caused by microbes, which are microscopic organisms, including fungi, viruses, and bacteria. These organisms are so small that they can only be seen through a microscope. Bacteria are the microbes that most commonly cause UTIs. °SYMPTOMS  °Symptoms of UTIs may vary by age and gender of the patient and by the location of the infection. Symptoms in young women typically include a frequent and intense urge to urinate and a painful, burning feeling in the bladder or urethra during urination. Older women and men are more likely to be tired, shaky, and weak and have muscle aches and abdominal pain. A fever may mean the infection is in your kidneys. Other symptoms of a kidney infection include pain in your back or sides below the ribs, nausea, and vomiting. °DIAGNOSIS °To diagnose a UTI, your caregiver will ask you about your symptoms. Your caregiver also will ask to provide a urine sample. The urine sample will be tested for bacteria and white blood cells. White blood cells are made   by your body to help fight infection. °TREATMENT  °Typically, UTIs can be treated with medication. Because most UTIs are caused by a bacterial infection, they usually can be treated with the use of antibiotics. The choice of antibiotic and length of treatment depend on your symptoms and the type of bacteria causing your infection. °HOME CARE INSTRUCTIONS °· If  you were prescribed antibiotics, take them exactly as your caregiver instructs you. Finish the medication even if you feel better after you have only taken some of the medication. °· Drink enough water and fluids to keep your urine clear or pale yellow. °· Avoid caffeine, tea, and carbonated beverages. They tend to irritate your bladder. °· Empty your bladder often. Avoid holding urine for long periods of time. °· Empty your bladder before and after sexual intercourse. °· After a bowel movement, women should cleanse from front to back. Use each tissue only once. °SEEK MEDICAL CARE IF:  °· You have back pain. °· You develop a fever. °· Your symptoms do not begin to resolve within 3 days. °SEEK IMMEDIATE MEDICAL CARE IF:  °· You have severe back pain or lower abdominal pain. °· You develop chills. °· You have nausea or vomiting. °· You have continued burning or discomfort with urination. °MAKE SURE YOU:  °· Understand these instructions. °· Will watch your condition. °· Will get help right away if you are not doing well or get worse. °Document Released: 05/12/2005 Document Revised: 02/01/2012 Document Reviewed: 09/10/2011 °ExitCare® Patient Information ©2015 [employer]. This information is not intended to replace advice given to you by your health care provider. Make sure you discuss any questions you have with your health care provider. ° °

## 2014-06-26 NOTE — ED Notes (Signed)
Pt made aware to return if symptoms worsen or if any life threatening symptoms occur.   

## 2014-07-21 ENCOUNTER — Encounter (HOSPITAL_COMMUNITY): Payer: Self-pay | Admitting: *Deleted

## 2014-07-21 ENCOUNTER — Inpatient Hospital Stay (HOSPITAL_COMMUNITY)
Admission: AD | Admit: 2014-07-21 | Discharge: 2014-07-22 | Disposition: A | Discharge status: Home / Self Care | Payer: Medicaid Other | Source: Ambulatory Visit | Attending: Obstetrics and Gynecology | Admitting: Obstetrics and Gynecology

## 2014-07-21 DIAGNOSIS — Z3A26 26 weeks gestation of pregnancy: Secondary | ICD-10-CM | POA: Insufficient documentation

## 2014-07-21 DIAGNOSIS — R109 Unspecified abdominal pain: Secondary | ICD-10-CM | POA: Insufficient documentation

## 2014-07-21 DIAGNOSIS — O9989 Other specified diseases and conditions complicating pregnancy, childbirth and the puerperium: Secondary | ICD-10-CM | POA: Diagnosis not present

## 2014-07-21 DIAGNOSIS — O99332 Smoking (tobacco) complicating pregnancy, second trimester: Secondary | ICD-10-CM | POA: Diagnosis not present

## 2014-07-21 DIAGNOSIS — F17210 Nicotine dependence, cigarettes, uncomplicated: Secondary | ICD-10-CM | POA: Diagnosis not present

## 2014-07-21 DIAGNOSIS — O26899 Other specified pregnancy related conditions, unspecified trimester: Secondary | ICD-10-CM

## 2014-07-21 HISTORY — DX: Unspecified abnormal cytological findings in specimens from vagina: R87.629

## 2014-07-21 LAB — URINALYSIS, ROUTINE W REFLEX MICROSCOPIC
Bilirubin Urine: NEGATIVE
Glucose, UA: NEGATIVE mg/dL
Hgb urine dipstick: NEGATIVE
Ketones, ur: 15 mg/dL — AB
NITRITE: NEGATIVE
PROTEIN: NEGATIVE mg/dL
Specific Gravity, Urine: 1.025 (ref 1.005–1.030)
UROBILINOGEN UA: 0.2 mg/dL (ref 0.0–1.0)
pH: 6.5 (ref 5.0–8.0)

## 2014-07-21 LAB — WET PREP, GENITAL
CLUE CELLS WET PREP: NONE SEEN
Trich, Wet Prep: NONE SEEN
YEAST WET PREP: NONE SEEN

## 2014-07-21 LAB — POCT FERN TEST: POCT FERN TEST: NEGATIVE

## 2014-07-21 LAB — URINE MICROSCOPIC-ADD ON

## 2014-07-21 NOTE — MAU Provider Note (Signed)
History     CSN: [id number]  Arrival date and time: 07/21/14 2122   First Provider Initiated Contact with Patient 07/21/14 2308      Chief Complaint  Patient presents with  . Back Pain  . Contractions   HPI  [name] is a 28 y.o. [phone] at 165w6d who presents today with contractions. She states that she has been contracting all day. She denies any vaginal bleeding. She states that she soaked her underwear earlier today, but has not had any leaking since then. She states that both fetuses have been active. She denies any hx of preterm labors or deliveries. She states that she has not had a cervical exam yet in this pregnancy. She denies any recent intercourse.   Past Medical History  Diagnosis Date  . Asthma   . Headache(784.0)     migraines  . Depression   . PIH (pregnancy induced hypertension)   . Late prenatal care     22wks  . Anemia 2007  . HPV (human papilloma virus) anogenital infection   . Recurrent UTI   . Abnormal Pap smear   . Chlamydia infection     has no taken treatment as of 01/03/2013  . SVD (spontaneous vaginal delivery) 01/08/2013  . Vaginal Pap smear, abnormal     Past Surgical History  Procedure Laterality Date  . No past surgeries      Family History  Problem Relation Age of Onset  . Anesthesia problems Neg Hx   . Hypotension Neg Hx   . Malignant hyperthermia Neg Hx   . Pseudochol deficiency Neg Hx   . Heart disease Maternal Grandmother     clots in heart  . Stroke Maternal Grandmother   . Diabetes Mother   . Heart disease Maternal Grandfather     pacemaker  . Miscarriages / Stillbirths Paternal Aunt   . Stroke Paternal Grandmother     History  Substance Use Topics  . Smoking status: Current Some Day Smoker -- 0.25 packs/day for 5 years    Types: Cigarettes  . Smokeless tobacco: Never Used  . Alcohol Use: No    Allergies:  Allergies  Allergen Reactions  . Corn Syrup Anaphylaxis and Hives    Prescriptions prior to  admission  Medication Sig Dispense Refill Last Dose  . acetaminophen (TYLENOL) 500 MG tablet Take 1,000 mg by mouth every 6 (six) hours as needed for mild pain or fever (toothache).    07/21/2014 at Unknown time  . albuterol (PROVENTIL HFA;VENTOLIN HFA) 108 (90 BASE) MCG/ACT inhaler Inhale 2 puffs into the lungs every 6 (six) hours as needed for wheezing or shortness of breath.   Past Month at Unknown time  . Butalbital-APAP-Caffeine 50-300-40 MG CAPS Take 1 tablet by mouth daily as needed.  0 07/20/2014 at Unknown time  . chlorhexidine (PERIDEX) 0.12 % solution every morning.  0 07/20/2014 at Unknown time  . diphenhydrAMINE (BENADRYL) 25 MG tablet Take 25 mg by mouth every 6 (six) hours as needed for itching or allergies (allergies).    07/20/2014 at Unknown time  . nitrofurantoin, macrocrystal-monohydrate, (MACROBID) 100 MG capsule Take 1 capsule (100 mg total) by mouth 2 (two) times daily. 20 capsule 0 Past Month at Unknown time  . Prenatal Vit-Fe Fumarate-FA (PRENATAL MULTIVITAMIN) TABS tablet Take 1 tablet by mouth 2 (two) times daily.   07/20/2014 at Unknown time  . promethazine (PHENERGAN) 12.5 MG tablet Take 12.5 mg by mouth every 4 (four) hours as needed for nausea or  vomiting (nausea).   07/21/2014 at Unknown time  . sertraline (ZOLOFT) 50 MG tablet Take 50 mg by mouth daily.  1 07/21/2014 at Unknown time  . traMADol (ULTRAM) 50 MG tablet Take 1 tablet (50 mg total) by mouth every 6 (six) hours as needed. 15 tablet 0 Past Month at Unknown time  . traMADol (ULTRAM) 50 MG tablet Take 1 tablet (50 mg total) by mouth every 6 (six) hours as needed. 8 tablet 0 Completed Course at Unknown time    ROS Physical Exam   Blood pressure 122/65, pulse 96, temperature 98 F (36.7 C), temperature source Oral, resp. rate 18, last menstrual period 01/14/2014.  Physical Exam  Nursing note and vitals reviewed. Constitutional: She is oriented to person, place, and time. She appears well-developed and  well-nourished. No distress.  Cardiovascular: Normal rate.   Respiratory: Effort normal.  GI: Soft. There is no tenderness. There is no rebound.  Unable to palpate any contractions. Sat at the bedside for about ~10 mins.   Neurological: She is alert and oriented to person, place, and time.  Skin: Skin is warm and dry.  Psychiatric: She has a normal mood and affect.   FHT A: 135, moderate with 15x15 accels, no decels FHT B: 150, moderate with 15x15 accels, no decels Toco: no UCs on the monitor and no UCs palpated MAU Course  Procedures   Results for orders placed or performed during the hospital encounter of 07/21/14 (from the past 24 hour(s))  Urinalysis, Routine w reflex microscopic     Status: Abnormal   Collection Time: 07/21/14  9:30 PM  Result Value Ref Range   Color, Urine YELLOW YELLOW   APPearance CLEAR CLEAR   Specific Gravity, Urine 1.025 1.005 - 1.030   pH 6.5 5.0 - 8.0   Glucose, UA NEGATIVE NEGATIVE mg/dL   Hgb urine dipstick NEGATIVE NEGATIVE   Bilirubin Urine NEGATIVE NEGATIVE   Ketones, ur 15 (A) NEGATIVE mg/dL   Protein, ur NEGATIVE NEGATIVE mg/dL   Urobilinogen, UA 0.2 0.0 - 1.0 mg/dL   Nitrite NEGATIVE NEGATIVE   Leukocytes, UA TRACE (A) NEGATIVE  Urine microscopic-add on     Status: Abnormal   Collection Time: 07/21/14  9:30 PM  Result Value Ref Range   Squamous Epithelial / LPF MANY (A) RARE   WBC, UA 0-2 <3 WBC/hpf   RBC / HPF 0-2 <3 RBC/hpf   Bacteria, UA FEW (A) RARE   Urine-Other AMORPHOUS URATES/PHOSPHATES   Fern Test     Status: None   Collection Time: 07/21/14 10:35 PM  Result Value Ref Range   POCT Fern Test Negative = intact amniotic membranes   Wet prep, genital     Status: Abnormal   Collection Time: 07/21/14 11:25 PM  Result Value Ref Range   Yeast Wet Prep HPF POC NONE SEEN NONE SEEN   Trich, Wet Prep NONE SEEN NONE SEEN   Clue Cells Wet Prep HPF POC NONE SEEN NONE SEEN   WBC, Wet Prep HPF POC MODERATE (A) NONE SEEN      0021:  D/W Dr. Ambrose MantleHenley, ok for dc home at this time.  Assessment and Plan   1. Abdominal pain affecting pregnancy    Fetal kick counts PTL precautions Return to MAU as needed  Follow-up Information    Follow up with Bing PlumeHENLEY,THOMAS F, MD.   Specialty:  Obstetrics and Gynecology   Why:  As scheduled   Contact information:   [address], [address] [place]  [address] [phone]        Tawnya Crook 07/21/2014, 11:29 PM

## 2014-07-21 NOTE — MAU Note (Signed)
Pt presents complaining of contractions every 10 minutes that started at 1800. States she is having back pain that she rates 8/10 and tylenol isn't helping. Twin pregnacy. Denies hx of preterm labor. Denies vaginal bleeding. States she had a gush of fluid at 1300 and is having to wear a pad because of leaking of fluid. Reports good fetal movement.

## 2014-07-22 DIAGNOSIS — O9989 Other specified diseases and conditions complicating pregnancy, childbirth and the puerperium: Secondary | ICD-10-CM

## 2014-07-22 DIAGNOSIS — R109 Unspecified abdominal pain: Secondary | ICD-10-CM

## 2014-07-22 NOTE — Discharge Instructions (Signed)
Third Trimester of Pregnancy The third trimester is from week 29 through week 42, months 7 through 9. The third trimester is a time when the fetus is growing rapidly. At the end of the ninth month, the fetus is about 20 inches in length and weighs 6-10 pounds.  BODY CHANGES Your body goes through many changes during pregnancy. The changes vary from woman to woman.   Your weight will continue to increase. You can expect to gain 25-35 pounds (11-16 kg) by the end of the pregnancy.  You may begin to get stretch marks on your hips, abdomen, and breasts.  You may urinate more often because the fetus is moving lower into your pelvis and pressing on your bladder.  You may develop or continue to have heartburn as a result of your pregnancy.  You may develop constipation because certain hormones are causing the muscles that push waste through your intestines to slow down.  You may develop hemorrhoids or swollen, bulging veins (varicose veins).  You may have pelvic pain because of the weight gain and pregnancy hormones relaxing your joints between the bones in your pelvis. Backaches may result from overexertion of the muscles supporting your posture.  You may have changes in your hair. These can include thickening of your hair, rapid growth, and changes in texture. Some women also have hair loss during or after pregnancy, or hair that feels dry or thin. Your hair will most likely return to normal after your baby is born.  Your breasts will continue to grow and be tender. A yellow discharge may leak from your breasts called colostrum.  Your belly button may stick out.  You may feel short of breath because of your expanding uterus.  You may notice the fetus "dropping," or moving lower in your abdomen.  You may have a bloody mucus discharge. This usually occurs a few days to a week before labor begins.  Your cervix becomes thin and soft (effaced) near your due date. WHAT TO EXPECT AT YOUR PRENATAL  EXAMS  You will have prenatal exams every 2 weeks until week 36. Then, you will have weekly prenatal exams. During a routine prenatal visit:  You will be weighed to make sure you and the fetus are growing normally.  Your blood pressure is taken.  Your abdomen will be measured to track your baby's growth.  The fetal heartbeat will be listened to.  Any test results from the previous visit will be discussed.  You may have a cervical check near your due date to see if you have effaced. At around 36 weeks, your caregiver will check your cervix. At the same time, your caregiver will also perform a test on the secretions of the vaginal tissue. This test is to determine if a type of bacteria, Group B streptococcus, is present. Your caregiver will explain this further. Your caregiver may ask you:  What your birth plan is.  How you are feeling.  If you are feeling the baby move.  If you have had any abnormal symptoms, such as leaking fluid, bleeding, severe headaches, or abdominal cramping.  If you have any questions. Other tests or screenings that may be performed during your third trimester include:  Blood tests that check for low iron levels (anemia).  Fetal testing to check the health, activity level, and growth of the fetus. Testing is done if you have certain medical conditions or if there are problems during the pregnancy. FALSE LABOR You may feel small, irregular contractions that   eventually go away. These are called Braxton Hicks contractions, or false labor. Contractions may last for hours, days, or even weeks before true labor sets in. If contractions come at regular intervals, intensify, or become painful, it is best to be seen by your caregiver.  SIGNS OF LABOR   Menstrual-like cramps.  Contractions that are 5 minutes apart or less.  Contractions that start on the top of the uterus and spread down to the lower abdomen and back.  A sense of increased pelvic pressure or back  pain.  A watery or bloody mucus discharge that comes from the vagina. If you have any of these signs before the 37th week of pregnancy, call your caregiver right away. You need to go to the hospital to get checked immediately. HOME CARE INSTRUCTIONS   Avoid all smoking, herbs, alcohol, and unprescribed drugs. These chemicals affect the formation and growth of the baby.  Follow your caregiver's instructions regarding medicine use. There are medicines that are either safe or unsafe to take during pregnancy.  Exercise only as directed by your caregiver. Experiencing uterine cramps is a good sign to stop exercising.  Continue to eat regular, healthy meals.  Wear a good support bra for breast tenderness.  Do not use hot tubs, steam rooms, or saunas.  Wear your seat belt at all times when driving.  Avoid raw meat, uncooked cheese, cat litter boxes, and soil used by cats. These carry germs that can cause birth defects in the baby.  Take your prenatal vitamins.  Try taking a stool softener (if your caregiver approves) if you develop constipation. Eat more high-fiber foods, such as fresh vegetables or fruit and whole grains. Drink plenty of fluids to keep your urine clear or pale yellow.  Take warm sitz baths to soothe any pain or discomfort caused by hemorrhoids. Use hemorrhoid cream if your caregiver approves.  If you develop varicose veins, wear support hose. Elevate your feet for 15 minutes, 3-4 times a day. Limit salt in your diet.  Avoid heavy lifting, wear low heal shoes, and practice good posture.  Rest a lot with your legs elevated if you have leg cramps or low back pain.  Visit your dentist if you have not gone during your pregnancy. Use a soft toothbrush to brush your teeth and be gentle when you floss.  A sexual relationship may be continued unless your caregiver directs you otherwise.  Do not travel far distances unless it is absolutely necessary and only with the approval  of your caregiver.  Take prenatal classes to understand, practice, and ask questions about the labor and delivery.  Make a trial run to the hospital.  Pack your hospital bag.  Prepare the baby's nursery.  Continue to go to all your prenatal visits as directed by your caregiver. SEEK MEDICAL CARE IF:  You are unsure if you are in labor or if your water has broken.  You have dizziness.  You have mild pelvic cramps, pelvic pressure, or nagging pain in your abdominal area.  You have persistent nausea, vomiting, or diarrhea.  You have a bad smelling vaginal discharge.  You have pain with urination. SEEK IMMEDIATE MEDICAL CARE IF:   You have a fever.  You are leaking fluid from your vagina.  You have spotting or bleeding from your vagina.  You have severe abdominal cramping or pain.  You have rapid weight loss or gain.  You have shortness of breath with chest pain.  You notice sudden or extreme swelling   of your face, hands, ankles, feet, or legs.  You have not felt your baby move in over an hour.  You have severe headaches that do not go away with medicine.  You have vision changes. Document Released: 07/27/2001 Document Revised: 08/07/2013 Document Reviewed: 10/03/2012 [employer] Patient Information 2015 [employer]. This information is not intended to replace advice given to you by your health care provider. Make sure you discuss any questions you have with your health care provider.  

## 2014-07-23 LAB — GC/CHLAMYDIA PROBE AMP
CT Probe RNA: POSITIVE — AB
GC Probe RNA: NEGATIVE

## 2014-08-08 ENCOUNTER — Inpatient Hospital Stay (HOSPITAL_COMMUNITY): Payer: Medicaid Other

## 2014-08-08 ENCOUNTER — Inpatient Hospital Stay (HOSPITAL_COMMUNITY)
Admission: AD | Admit: 2014-08-08 | Discharge: 2014-08-10 | DRG: 781 | Disposition: A | Discharge status: Home / Self Care | Payer: Medicaid Other | Source: Ambulatory Visit | Attending: Obstetrics and Gynecology | Admitting: Obstetrics and Gynecology

## 2014-08-08 ENCOUNTER — Encounter (HOSPITAL_COMMUNITY): Payer: Self-pay | Admitting: *Deleted

## 2014-08-08 DIAGNOSIS — F17210 Nicotine dependence, cigarettes, uncomplicated: Secondary | ICD-10-CM | POA: Diagnosis present

## 2014-08-08 DIAGNOSIS — F329 Major depressive disorder, single episode, unspecified: Secondary | ICD-10-CM | POA: Diagnosis present

## 2014-08-08 DIAGNOSIS — O30003 Twin pregnancy, unspecified number of placenta and unspecified number of amniotic sacs, third trimester: Secondary | ICD-10-CM

## 2014-08-08 DIAGNOSIS — K851 Biliary acute pancreatitis: Secondary | ICD-10-CM | POA: Diagnosis present

## 2014-08-08 DIAGNOSIS — Z833 Family history of diabetes mellitus: Secondary | ICD-10-CM

## 2014-08-08 DIAGNOSIS — Z823 Family history of stroke: Secondary | ICD-10-CM

## 2014-08-08 DIAGNOSIS — O99343 Other mental disorders complicating pregnancy, third trimester: Secondary | ICD-10-CM | POA: Diagnosis present

## 2014-08-08 DIAGNOSIS — R748 Abnormal levels of other serum enzymes: Secondary | ICD-10-CM

## 2014-08-08 DIAGNOSIS — O99333 Smoking (tobacco) complicating pregnancy, third trimester: Secondary | ICD-10-CM | POA: Diagnosis present

## 2014-08-08 DIAGNOSIS — O30043 Twin pregnancy, dichorionic/diamniotic, third trimester: Secondary | ICD-10-CM | POA: Diagnosis present

## 2014-08-08 DIAGNOSIS — O26613 Liver and biliary tract disorders in pregnancy, third trimester: Principal | ICD-10-CM | POA: Diagnosis present

## 2014-08-08 DIAGNOSIS — K859 Acute pancreatitis without necrosis or infection, unspecified: Secondary | ICD-10-CM

## 2014-08-08 DIAGNOSIS — Z3A29 29 weeks gestation of pregnancy: Secondary | ICD-10-CM | POA: Diagnosis present

## 2014-08-08 DIAGNOSIS — Z87440 Personal history of urinary (tract) infections: Secondary | ICD-10-CM | POA: Diagnosis not present

## 2014-08-08 HISTORY — DX: Acute pancreatitis without necrosis or infection, unspecified: K85.90

## 2014-08-08 HISTORY — DX: Twin pregnancy, unspecified number of placenta and unspecified number of amniotic sacs, third trimester: O30.003

## 2014-08-08 LAB — COMPREHENSIVE METABOLIC PANEL
ALT: 51 U/L — AB (ref 0–35)
AST: 116 U/L — ABNORMAL HIGH (ref 0–37)
Albumin: 2.9 g/dL — ABNORMAL LOW (ref 3.5–5.2)
Alkaline Phosphatase: 114 U/L (ref 39–117)
Anion gap: 8 (ref 5–15)
BUN: 8 mg/dL (ref 6–23)
CO2: 22 mmol/L (ref 19–32)
CREATININE: 0.44 mg/dL — AB (ref 0.50–1.10)
Calcium: 8.4 mg/dL (ref 8.4–10.5)
Chloride: 107 mEq/L (ref 96–112)
Glucose, Bld: 110 mg/dL — ABNORMAL HIGH (ref 70–99)
Potassium: 2.8 mmol/L — ABNORMAL LOW (ref 3.5–5.1)
Sodium: 137 mmol/L (ref 135–145)
Total Bilirubin: 0.9 mg/dL (ref 0.3–1.2)
Total Protein: 5.9 g/dL — ABNORMAL LOW (ref 6.0–8.3)

## 2014-08-08 LAB — CBC
HCT: 26 % — ABNORMAL LOW (ref 36.0–46.0)
Hemoglobin: 8.8 g/dL — ABNORMAL LOW (ref 12.0–15.0)
MCH: 30.7 pg (ref 26.0–34.0)
MCHC: 33.8 g/dL (ref 30.0–36.0)
MCV: 90.6 fL (ref 78.0–100.0)
PLATELETS: 240 10*3/uL (ref 150–400)
RBC: 2.87 MIL/uL — AB (ref 3.87–5.11)
RDW: 13.3 % (ref 11.5–15.5)
WBC: 12.9 10*3/uL — AB (ref 4.0–10.5)

## 2014-08-08 LAB — AMYLASE: AMYLASE: 649 U/L — AB (ref 0–105)

## 2014-08-08 LAB — WET PREP, GENITAL
CLUE CELLS WET PREP: NONE SEEN
Trich, Wet Prep: NONE SEEN
Yeast Wet Prep HPF POC: NONE SEEN

## 2014-08-08 LAB — URINALYSIS, ROUTINE W REFLEX MICROSCOPIC
Bilirubin Urine: NEGATIVE
GLUCOSE, UA: NEGATIVE mg/dL
HGB URINE DIPSTICK: NEGATIVE
KETONES UR: NEGATIVE mg/dL
Nitrite: NEGATIVE
PROTEIN: NEGATIVE mg/dL
Specific Gravity, Urine: 1.025 (ref 1.005–1.030)
UROBILINOGEN UA: 0.2 mg/dL (ref 0.0–1.0)
pH: 6 (ref 5.0–8.0)

## 2014-08-08 LAB — LIPASE, BLOOD: LIPASE: 1626 U/L — AB (ref 11–59)

## 2014-08-08 LAB — FETAL FIBRONECTIN: Fetal Fibronectin: NEGATIVE

## 2014-08-08 LAB — URINE MICROSCOPIC-ADD ON

## 2014-08-08 MED ORDER — THIAMINE HCL 100 MG/ML IJ SOLN
Freq: Once | INTRAVENOUS | Status: AC
  Administered 2014-08-08: 21:00:00 via INTRAVENOUS
  Filled 2014-08-08: qty 1000

## 2014-08-08 MED ORDER — ONDANSETRON HCL 4 MG/2ML IJ SOLN
4.0000 mg | Freq: Four times a day (QID) | INTRAMUSCULAR | Status: DC | PRN
Start: 2014-08-08 — End: 2014-08-10

## 2014-08-08 MED ORDER — SODIUM CHLORIDE 0.9 % IJ SOLN: 9.0000 mL | INTRAMUSCULAR | Status: DC | PRN

## 2014-08-08 MED ORDER — KETOROLAC TROMETHAMINE 30 MG/ML IJ SOLN
30.0000 mg | Freq: Once | INTRAMUSCULAR | Status: AC
  Administered 2014-08-08: 30 mg via INTRAVENOUS
  Filled 2014-08-08: qty 1

## 2014-08-08 MED ORDER — KCL IN DEXTROSE-NACL 40-5-0.45 MEQ/L-%-% IV SOLN
INTRAVENOUS | Status: DC
  Administered 2014-08-08 – 2014-08-09 (×5): via INTRAVENOUS
  Filled 2014-08-08 (×10): qty 1000

## 2014-08-08 MED ORDER — MORPHINE SULFATE (PF) 1 MG/ML IV SOLN
INTRAVENOUS | Status: DC
Start: 2014-08-08 — End: 2014-08-09
  Administered 2014-08-08: 9 mg via INTRAVENOUS
  Administered 2014-08-08: 15:00:00 via INTRAVENOUS
  Administered 2014-08-08: 9 mg via INTRAVENOUS
  Administered 2014-08-09: 12 mg via INTRAVENOUS
  Administered 2014-08-09 (×2): 3 mg via INTRAVENOUS
  Administered 2014-08-09: 6 mg via INTRAVENOUS
  Administered 2014-08-09: 1.5 mg via INTRAVENOUS
  Filled 2014-08-08 (×2): qty 25

## 2014-08-08 MED ORDER — SODIUM CHLORIDE 0.45 % IV SOLN
INTRAVENOUS | Status: DC
  Filled 2014-08-08 (×2): qty 1000

## 2014-08-08 MED ORDER — DIPHENHYDRAMINE HCL 12.5 MG/5ML PO ELIX
12.5000 mg | ORAL_SOLUTION | Freq: Four times a day (QID) | ORAL | Status: DC | PRN
  Filled 2014-08-08: qty 5

## 2014-08-08 MED ORDER — TERBUTALINE SULFATE 1 MG/ML IJ SOLN
0.2500 mg | Freq: Once | INTRAMUSCULAR | Status: AC
  Administered 2014-08-08: 0.25 mg via SUBCUTANEOUS
  Filled 2014-08-08: qty 1

## 2014-08-08 MED ORDER — CALCIUM CARBONATE ANTACID 500 MG PO CHEW: 2.0000 | CHEWABLE_TABLET | ORAL | Status: DC | PRN

## 2014-08-08 MED ORDER — DIPHENHYDRAMINE HCL 50 MG/ML IJ SOLN: 12.5000 mg | Freq: Four times a day (QID) | INTRAMUSCULAR | Status: DC | PRN

## 2014-08-08 MED ORDER — NALOXONE HCL 0.4 MG/ML IJ SOLN: 0.4000 mg | INTRAMUSCULAR | Status: DC | PRN

## 2014-08-08 MED ORDER — ZOLPIDEM TARTRATE 5 MG PO TABS
5.0000 mg | ORAL_TABLET | Freq: Every evening | ORAL | Status: DC | PRN
Start: 2014-08-08 — End: 2014-08-10
  Administered 2014-08-08 – 2014-08-09 (×2): 5 mg via ORAL
  Filled 2014-08-08 (×2): qty 1

## 2014-08-08 MED ORDER — PROMETHAZINE HCL 25 MG/ML IJ SOLN: 12.5000 mg | Freq: Four times a day (QID) | INTRAMUSCULAR | Status: DC | PRN

## 2014-08-08 MED ORDER — POTASSIUM CHLORIDE 2 MEQ/ML IV SOLN: INTRAVENOUS | Status: DC

## 2014-08-08 MED ORDER — SODIUM CHLORIDE 0.45 % IV SOLN: INTRAVENOUS | Status: DC

## 2014-08-08 MED ORDER — POTASSIUM CHLORIDE 2 MEQ/ML IV SOLN
Freq: Once | INTRAVENOUS | Status: AC
  Administered 2014-08-08: 10:00:00 via INTRAVENOUS
  Filled 2014-08-08: qty 1000

## 2014-08-08 NOTE — H&P (Addendum)
[name] is a 28 y.o. female [phone] at 29+ with twin IUP admitted for pancreatitis.  Amylase and Lipase elevated; LFTs mildly elevated 50-100.  Tbili 0.9.  Admitted with pain, N/V.  Severe back and abdmonial pain.  Relatively uncomplicated PNC, except ASCUS HR HPV, pap smear.  Also Chl in early pregmnancy, neg TOC.  Pt with depression on Zoloft 50mg .  Regular PNC.  Di/Di twins, growth followed.  D/W [facility], agree with admission, IVF and pain control.     Maternal Medical History:  Contractions: Frequency: irregular.   Perceived severity is moderate.    Fetal activity: Perceived fetal activity is normal.    Prenatal complications: Cholelithiasis.   Prenatal Complications - Diabetes: none.    OB History    Gravida Para Term Preterm AB TAB SAB Ectopic Multiple Living   6 3 3  0 2 0 2 0 0 3    SVD x3, SAB x 2; + abn pap, + STD- Chl  Past Medical History  Diagnosis Date  . Asthma   . Headache(784.0)     migraines  . Depression   . PIH (pregnancy induced hypertension)   . Late prenatal care     22wks  . Anemia 2007  . HPV (human papilloma virus) anogenital infection   . Recurrent UTI   . Abnormal Pap smear   . Chlamydia infection     has no taken treatment as of 01/03/2013  . SVD (spontaneous vaginal delivery) 01/08/2013  . Vaginal Pap smear, abnormal   . Twin pregnancy in third trimester 08/08/2014  . Pancreatitis, acute 08/08/2014   Past Surgical History  Procedure Laterality Date  . No past surgeries     Family History: family history includes Diabetes in her mother; Heart disease in her maternal grandfather and maternal grandmother; Miscarriages / [place] in her paternal aunt; Stroke in her maternal grandmother and paternal grandmother. There is no history of Anesthesia problems, Hypotension, Malignant hyperthermia, or Pseudochol deficiency. Social History:  reports that she has been smoking Cigarettes.  She has a 1.25 pack-year smoking history. She has never  used smokeless tobacco. She reports that she does not drink alcohol or use illicit drugs.  Meds PNV, Zoloft All NKDA   Prenatal Transfer Tool  Maternal Diabetes: No Genetic Screening: Normal Maternal Ultrasounds/Referrals: Abnormal:  Findings:   Other:twin A 2 VC Fetal Ultrasounds or other Referrals:  None Maternal Substance Abuse:  Yes:  Type: Smoker Significant Maternal Medications:  Meds include: Zoloft Significant Maternal Lab Results:  Lab values include: Other: pancreatitic, unknown GBBS Other Comments:  Di/Di TWIN pregnancy, dental abscess in preg, +Chl - neg TOC  Review of Systems  Constitutional: Negative.   HENT: Negative.   Eyes: Negative.   Respiratory: Negative.   Cardiovascular: Negative.   Gastrointestinal: Positive for vomiting and abdominal pain.  Genitourinary: Negative.   Musculoskeletal: Positive for myalgias.  Skin: Negative.   Neurological: Negative.   Psychiatric/Behavioral: Negative.     Dilation: Closed Effacement (%): Thick Exam by:: Rasch,NP Blood pressure 113/67, pulse 91, temperature 97.5 F (36.4 C), temperature source Oral, resp. rate 18, last menstrual period 01/14/2014, SpO2 98 %. Maternal Exam:  Abdomen: Patient reports the following abdominal tenderness: epigastric.  Surgical scars: low transverse.   Fundal height is appropriate for gestation.    Introitus: Normal vulva. Normal vagina.    Physical Exam  Constitutional: She is oriented to person, place, and time. She appears well-developed and well-nourished.  HENT:  Head: Normocephalic and atraumatic.  Cardiovascular:  Normal rate and regular rhythm.   Respiratory: Effort normal and breath sounds normal. No respiratory distress. She has no wheezes.  GI: There is tenderness in the epigastric area.  Musculoskeletal: Normal range of motion.  Neurological: She is alert and oriented to person, place, and time.  Skin: Skin is warm and dry.  Psychiatric: She has a normal mood and affect.  Her behavior is normal.    Prenatal labs: ABO, Rh:  O+ Antibody:  neg Rubella:  immune RPR:   NR HBsAg:   neg HIV:   neg GBS:   unknown  Hgb 10.1/Ur Cx neg/Chl + - TOC neg/GC neg/glucola 123/Pap ASCUS HR HPV  Twin 10/25/14 - cwd A 2VC, nl anat, ant plac, female B limited nl anat, ant plac, female  Tdao 07/31/14  Assessment/Plan: [id number] at 31+, twin preg, pancreatitis Admit, NPO, IVF Pain control with PCA Appreciate GI input  Bovard-Stuckert, [name] 08/08/2014, 11:46 AM

## 2014-08-08 NOTE — Consult Note (Signed)
Referring Provider: No ref. provider found Primary Care Physician:  No PCP Per Patient Primary Gastroenterologist:  Gentry FitzUnassigned  Reason for Consultation:  Pancreatitis  HPI: [name] is a 28 y.o. female [phone] at 3827w3d currently with twins [facility] with abdominal pain and back pain. The pain started around 0300. The pain is located in her upper abdomen and it is very sharp in nature. The patient states that her pain is excruciating.  Had nausea and dry heaves; says that she made herself vomit, which relieved some of the pressure.  Upon evaluation she was found to have elevated amylase and lipase at 649 and 1626, respectively.  Low potassium at 2.8.  AST is elevated at 116 and ALT at 51, but ALP and total bili are normal.  Ultrasound shows gallstones with the largest measuring 5 mm.  Normal CBD at 3.2 mm.   Past Medical History  Diagnosis Date  . Asthma   . Headache(784.0)     migraines  . Depression   . PIH (pregnancy induced hypertension)   . Late prenatal care     22wks  . Anemia 2007  . HPV (human papilloma virus) anogenital infection   . Recurrent UTI   . Abnormal Pap smear   . Chlamydia infection     has no taken treatment as of 01/03/2013  . SVD (spontaneous vaginal delivery) 01/08/2013  . Vaginal Pap smear, abnormal     Past Surgical History  Procedure Laterality Date  . No past surgeries      Prior to Admission medications   Medication Sig Start Date End Date Taking? Authorizing Provider  acetaminophen (TYLENOL) 500 MG tablet Take 1,000 mg by mouth every 6 (six) hours as needed for mild pain or fever (toothache).    Yes Historical Provider, MD  Butalbital-APAP-Caffeine 50-300-40 MG CAPS Take 1 tablet by mouth daily as needed. 05/24/14  Yes Historical Provider, MD  chlorhexidine (PERIDEX) 0.12 % solution every morning. 04/24/14  Yes Historical Provider, MD  diphenhydrAMINE (BENADRYL) 25 MG tablet Take 25 mg by mouth every 6 (six)  hours as needed for itching or allergies (allergies).    Yes Historical Provider, MD  Prenatal Vit-Fe Fumarate-FA (PRENATAL MULTIVITAMIN) TABS tablet Take 1 tablet by mouth 2 (two) times daily.   Yes Historical Provider, MD  promethazine (PHENERGAN) 12.5 MG tablet Take 12.5 mg by mouth every 4 (four) hours as needed for nausea or vomiting (nausea).   Yes Historical Provider, MD  sertraline (ZOLOFT) 50 MG tablet Take 50 mg by mouth daily. 06/12/14  Yes Historical Provider, MD  albuterol (PROVENTIL HFA;VENTOLIN HFA) 108 (90 BASE) MCG/ACT inhaler Inhale 2 puffs into the lungs every 6 (six) hours as needed for wheezing or shortness of breath.    Historical Provider, MD  nitrofurantoin, macrocrystal-monohydrate, (MACROBID) 100 MG capsule Take 1 capsule (100 mg total) by mouth 2 (two) times daily. 06/26/14   Mirian MoMatthew Gentry, MD  traMADol (ULTRAM) 50 MG tablet Take 1 tablet (50 mg total) by mouth every 6 (six) hours as needed. 06/01/14   Earle GellBenjamin W Cartner, PA-C  traMADol (ULTRAM) 50 MG tablet Take 1 tablet (50 mg total) by mouth every 6 (six) hours as needed. 06/08/14   Enid SkeensJoshua M Zavitz, MD    No current facility-administered medications for this encounter.    Allergies as of 08/08/2014 - Review Complete 08/08/2014  Allergen Reaction Noted  . Corn syrup Anaphylaxis and Hives 10/15/2011    Family History  Problem Relation Age  of Onset  . Anesthesia problems Neg Hx   . Hypotension Neg Hx   . Malignant hyperthermia Neg Hx   . Pseudochol deficiency Neg Hx   . Heart disease Maternal Grandmother     clots in heart  . Stroke Maternal Grandmother   . Diabetes Mother   . Heart disease Maternal Grandfather     pacemaker  . Miscarriages / Stillbirths Paternal Aunt   . Stroke Paternal Grandmother     History   Social History  . Marital Status: Single    Spouse Name: N/A    Number of Children: N/A  . Years of Education: N/A   Occupational History  . Not on file.   Social History Main Topics    . Smoking status: Current Some Day Smoker -- 0.25 packs/day for 5 years    Types: Cigarettes  . Smokeless tobacco: Never Used  . Alcohol Use: No  . Drug Use: No  . Sexual Activity: Yes    Birth Control/ Protection: None     Comment: sex one week ago Dec 1   Other Topics Concern  . Not on file   Social History Narrative    Review of Systems: Ten point ROS is O/W negative except as mentioned in HPI.  Physical Exam: Vital signs in last 24 hours: Temp:  [97.5 F (36.4 C)-97.8 F (36.6 C)] 97.5 F (36.4 C) (12/24 0839) Pulse Rate:  [84-99] 91 (12/24 0839) Resp:  [18-24] 18 (12/24 0839) BP: (113-121)/(49-67) 113/67 mmHg (12/24 0839) SpO2:  [98 %-100 %] 98 % (12/24 0734)   General:   Alert, Well-developed, well-nourished, pleasant and cooperative in NAD Head:  Normocephalic and atraumatic. Eyes:  Sclera clear, no icterus.  Conjunctiva pink. Ears:  Normal auditory acuity. Mouth:  No deformity or lesions.   Lungs:  Clear throughout to auscultation.  No wheezes, crackles, or rhonchi.  Heart:  Regular rate and rhythm; no murmurs, clicks, rubs, or gallops. Abdomen:  Soft, gravid uterus.  BS present.  Epigastric TTP without R/R/G.  Rectal:  Deferred  Msk:  Symmetrical without gross deformities. Pulses:  Normal pulses noted. Extremities:  Without clubbing or edema. Neurologic:  Alert and  oriented x4;  grossly normal neurologically. Skin:  Intact without significant lesions or rashes. Psych:  Alert and cooperative. Normal mood and affect.  Lab Results:  Recent Labs  08/08/14 0625  WBC 12.9*  HGB 8.8*  HCT 26.0*  PLT 240   BMET  Recent Labs  08/08/14 0625  NA 137  K 2.8*  CL 107  CO2 22  GLUCOSE 110*  BUN 8  CREATININE 0.44*  CALCIUM 8.4   LFT  Recent Labs  08/08/14 0625  PROT 5.9*  ALBUMIN 2.9*  AST 116*  ALT 51*  ALKPHOS 114  BILITOT 0.9   Studies/Results: US Abdomen Complete  08/08/2014   CLINICAL DATA:  Elevated LFTs  EXAM: ULTRASOUND ABDOMEN  COMPLETE  COMPARISON:  04/05/2005  FINDINGS: Gallbladder: Small mobile gallstones are noted within gallbladder the largest measures 5 mm. No thickening of gallbladder wall. No sonographic Murphy's sign.  Common bile duct: Diameter: 3.2 mm in diameter within normal limits.  Liver: No focal lesion identified. Mild heterogeneous echogenicity without intrahepatic biliary ductal dilatation.  IVC: No abnormality visualized.  Pancreas: Visualized portion unremarkable. Pancreatic tail not visualized due to abundant bowel gas.  Spleen: Size and appearance within normal limits. Measures 5.5 cm in length.  Right Kidney: Length: 13 cm. Echogenicity within normal limits. No mass or hydronephrosis visualized.  Left Kidney: Length: 13 cm. Echogenicity within normal limits. No mass or hydronephrosis visualized.  Abdominal aorta: No aneurysm visualized. Measures up to 1.7 cm in diameter.  Other findings: None.  IMPRESSION: 1. Small gallstones are noted within gallbladder the largest measures 5 mm. No sonographic Murphy's sign. 2. Normal CBD. 3. No hydronephrosis or renal calculus. 4. No focal hepatic mass. 5. No aortic aneurysm.   Electronically Signed   By: Natasha MeadLiviu  Pop M.D.   On: 08/08/2014 09:25    IMPRESSION:  -Acute pancreatitis:  Likely biliary in origin since she does have gallstones on ultrasound, however, total bili and ALP not elevated and CBD 3.2 mm.  She may have passed a stone. -Hypokalemia:  Repletion per primary service.     PLAN: -At this point would recommend conservative/supportive care with a lot of IVF's, bowel rest/NPO, pain management, and anti-emetics.  I have order 1/2 NS at 250 cc/hour. -Trend LFT's, monitor electrolytes.  ZEHR, JESSICA D.  08/08/2014, 11:43 AM  Pager number [phone]  GI Attending Note   Chart was reviewed and patient was examined. X-rays and lab were reviewed.   Probable gallstone pancreatitis that is resolving.  Doubt impacted CBD stone.  No other risk factors for  pancreatitis.  Agree with conservative management for now.  Should this recur during the pregnancy would consider biliary stent placement which ought to protect her from recurrent pancreatitis, and elective cholecystectomy following childbirth.  Regardless, recommend cholecystectomy following childbirth.  Barbette Hairobert D. Arlyce DiceKaplan, M.D., [facility] [phone]

## 2014-08-08 NOTE — Progress Notes (Signed)
EFM held in place manually for 10 min tracing. Patient C/O lower back pain and pain in L hip and leg. Ambulated to BR without difficulty. Dr. Ellyn HackBovard called in for report. Reviewed resulted labs. Will be in to see patient.

## 2014-08-08 NOTE — MAU Provider Note (Signed)
History     CSN: [id number]  Arrival date and time: 08/08/14 3903   First Provider Initiated Contact with Patient 08/08/14 0519      No chief complaint on file.  HPI   [name] is a 28 y.o. female [phone] at [id number] who presents with abdominal pain and back pain. The pain started around 0300. The pain is located in her upper stomach and it is very sharp. The patient states that her pain is excruciating. .  Denies problems history of gall bladder attack, she does have a hernia.  Denies vaginal bleeding or leaking of fluid.  Denies intercourse or anything in the vagina in the last 24 hour.   The patient was positive for chlamydia 3 weeks ago and took the medication as prescribed. She has not had intercourse again with that partner.   She has had problems with GERD in this pregnancy; she has not taken anything for it and states that she thought it was just normal.   OB History    Gravida Para Term Preterm AB TAB SAB Ectopic Multiple Living   '6 3 3 ' 0 2 0 2 0 0 3      Past Medical History  Diagnosis Date  . Asthma   . Headache(784.0)     migraines  . Depression   . PIH (pregnancy induced hypertension)   . Late prenatal care     22wks  . Anemia 2007  . HPV (human papilloma virus) anogenital infection   . Recurrent UTI   . Abnormal Pap smear   . Chlamydia infection     has no taken treatment as of 01/03/2013  . SVD (spontaneous vaginal delivery) 01/08/2013  . Vaginal Pap smear, abnormal     Past Surgical History  Procedure Laterality Date  . No past surgeries      Family History  Problem Relation Age of Onset  . Anesthesia problems Neg Hx   . Hypotension Neg Hx   . Malignant hyperthermia Neg Hx   . Pseudochol deficiency Neg Hx   . Heart disease Maternal Grandmother     clots in heart  . Stroke Maternal Grandmother   . Diabetes Mother   . Heart disease Maternal Grandfather     pacemaker  . Miscarriages / Stillbirths Paternal Aunt   . Stroke  Paternal Grandmother     History  Substance Use Topics  . Smoking status: Current Some Day Smoker -- 0.25 packs/day for 5 years    Types: Cigarettes  . Smokeless tobacco: Never Used  . Alcohol Use: No    Allergies:  Allergies  Allergen Reactions  . Corn Syrup Anaphylaxis and Hives    Prescriptions prior to admission  Medication Sig Dispense Refill Last Dose  . acetaminophen (TYLENOL) 500 MG tablet Take 1,000 mg by mouth every 6 (six) hours as needed for mild pain or fever (toothache).    08/07/2014 at Unknown time  . Butalbital-APAP-Caffeine 50-300-40 MG CAPS Take 1 tablet by mouth daily as needed.  0 08/07/2014 at Unknown time  . chlorhexidine (PERIDEX) 0.12 % solution every morning.  0 08/07/2014 at Unknown time  . diphenhydrAMINE (BENADRYL) 25 MG tablet Take 25 mg by mouth every 6 (six) hours as needed for itching or allergies (allergies).    Past Week at Unknown time  . Prenatal Vit-Fe Fumarate-FA (PRENATAL MULTIVITAMIN) TABS tablet Take 1 tablet by mouth 2 (two) times daily.   08/07/2014 at Unknown time  . promethazine (PHENERGAN) 12.5 MG tablet  Take 12.5 mg by mouth every 4 (four) hours as needed for nausea or vomiting (nausea).   08/07/2014 at Unknown time  . sertraline (ZOLOFT) 50 MG tablet Take 50 mg by mouth daily.  1 08/07/2014 at Unknown time  . albuterol (PROVENTIL HFA;VENTOLIN HFA) 108 (90 BASE) MCG/ACT inhaler Inhale 2 puffs into the lungs every 6 (six) hours as needed for wheezing or shortness of breath.   More than a month at Unknown time  . nitrofurantoin, macrocrystal-monohydrate, (MACROBID) 100 MG capsule Take 1 capsule (100 mg total) by mouth 2 (two) times daily. 20 capsule 0 Past Month at Unknown time  . traMADol (ULTRAM) 50 MG tablet Take 1 tablet (50 mg total) by mouth every 6 (six) hours as needed. 15 tablet 0 Past Month at Unknown time  . traMADol (ULTRAM) 50 MG tablet Take 1 tablet (50 mg total) by mouth every 6 (six) hours as needed. 8 tablet 0 Completed  Course at Unknown time   Results for orders placed or performed during the hospital encounter of 08/08/14 (from the past 48 hour(s))  Urinalysis, Routine w reflex microscopic     Status: Abnormal   Collection Time: 08/08/14  4:45 AM  Result Value Ref Range   Color, Urine YELLOW YELLOW   APPearance CLEAR CLEAR   Specific Gravity, Urine 1.025 1.005 - 1.030   pH 6.0 5.0 - 8.0   Glucose, UA NEGATIVE NEGATIVE mg/dL   Hgb urine dipstick NEGATIVE NEGATIVE   Bilirubin Urine NEGATIVE NEGATIVE   Ketones, ur NEGATIVE NEGATIVE mg/dL   Protein, ur NEGATIVE NEGATIVE mg/dL   Urobilinogen, UA 0.2 0.0 - 1.0 mg/dL   Nitrite NEGATIVE NEGATIVE   Leukocytes, UA SMALL (A) NEGATIVE  Urine microscopic-add on     Status: None   Collection Time: 08/08/14  4:45 AM  Result Value Ref Range   Squamous Epithelial / LPF RARE RARE   WBC, UA 3-6 <3 WBC/hpf   Bacteria, UA RARE RARE  Fetal fibronectin     Status: None   Collection Time: 08/08/14  5:30 AM  Result Value Ref Range   Fetal Fibronectin NEGATIVE NEGATIVE  Wet prep, genital     Status: Abnormal   Collection Time: 08/08/14  5:30 AM  Result Value Ref Range   Yeast Wet Prep HPF POC NONE SEEN NONE SEEN   Trich, Wet Prep NONE SEEN NONE SEEN   Clue Cells Wet Prep HPF POC NONE SEEN NONE SEEN   WBC, Wet Prep HPF POC FEW (A) NONE SEEN    Comment: FEW BACTERIA SEEN  CBC     Status: Abnormal   Collection Time: 08/08/14  6:25 AM  Result Value Ref Range   WBC 12.9 (H) 4.0 - 10.5 K/uL   RBC 2.87 (L) 3.87 - 5.11 MIL/uL   Hemoglobin 8.8 (L) 12.0 - 15.0 g/dL   HCT 26.0 (L) 36.0 - 46.0 %   MCV 90.6 78.0 - 100.0 fL   MCH 30.7 26.0 - 34.0 pg   MCHC 33.8 30.0 - 36.0 g/dL   RDW 13.3 11.5 - 15.5 %   Platelets 240 150 - 400 K/uL  Comprehensive metabolic panel     Status: Abnormal   Collection Time: 08/08/14  6:25 AM  Result Value Ref Range   Sodium 137 135 - 145 mmol/L    Comment: Please note change in reference range.   Potassium 2.8 (L) 3.5 - 5.1 mmol/L     Comment: Please note change in reference range.   Chloride 107 96 -  112 mEq/L   CO2 22 19 - 32 mmol/L   Glucose, Bld 110 (H) 70 - 99 mg/dL   BUN 8 6 - 23 mg/dL   Creatinine, Ser 0.44 (L) 0.50 - 1.10 mg/dL   Calcium 8.4 8.4 - 10.5 mg/dL   Total Protein 5.9 (L) 6.0 - 8.3 g/dL   Albumin 2.9 (L) 3.5 - 5.2 g/dL   AST 116 (H) 0 - 37 U/L   ALT 51 (H) 0 - 35 U/L   Alkaline Phosphatase 114 39 - 117 U/L   Total Bilirubin 0.9 0.3 - 1.2 mg/dL   GFR calc non Af Amer >90 >90 mL/min   GFR calc Af Amer >90 >90 mL/min    Comment: (NOTE) The eGFR has been calculated using the CKD EPI equation. This calculation has not been validated in all clinical situations. eGFR's persistently <90 mL/min signify possible Chronic Kidney Disease.    Anion gap 8 5 - 15    Review of Systems  Constitutional: Negative for fever and chills.  Gastrointestinal: Positive for abdominal pain. Negative for diarrhea and constipation.  Musculoskeletal: Positive for back pain (Pain from the top of my shoulder blades down my whole back. ).   Physical Exam   Blood pressure 121/49, pulse 84, temperature 97.8 F (36.6 C), temperature source Oral, resp. rate 24, last menstrual period 01/14/2014, SpO2 100 %.  Physical Exam  Constitutional: She is oriented to person, place, and time. She appears well-developed and well-nourished.  Non-toxic appearance. She does not have a sickly appearance. She appears ill. She appears distressed.  HENT:  Head: Normocephalic.  Eyes: Pupils are equal, round, and reactive to light.  Neck: Neck supple.  Respiratory: Effort normal.  GI: Soft. There is generalized tenderness. There is no rebound and no guarding.  Mild contractions palpated. Fundus palpates soft in between contractions   Genitourinary: Vaginal discharge found.  Musculoskeletal: Normal range of motion.       Cervical back: She exhibits tenderness.       Thoracic back: She exhibits tenderness.       Lumbar back: She exhibits  tenderness. She exhibits no spasm.  Neurological: She is alert and oriented to person, place, and time.  Skin: Skin is warm. She is not diaphoretic.  Psychiatric: Her behavior is normal.    Fetal Tracing: A Baseline: 150 bpm  Variability: Moderate  Accelerations: 15x15 Decelerations: None Toco: Irregular contraction patter, Q3-4 mins with uterine irritability. Following Terb contractions absent   Fetal Tracing: B Baseline: 140 bpm Variability: Moderate  Accelerations: 15x15 Decelerations: None Toco:   MAU Course  Procedures  None  MDM Discussed HPI and physical exam with Dr. Ulanda Edison  CBC CMET Lipase Amylase   0815: Report given to Monna Fam NP who resumes care are of the patient. Labs reviewed. Patient states her pain is 7/10; states the terbutaline helped a lot however she still has significant pain. Her pain more severe in her lower back; bilateral sides of her lower back.   Spoke to Dr. Melba Coon who will be into MAU to see the patient. She has ordered a RUQ [place] scan. IV fluids with potassium ordered.   Assessment and Plan   A:  Abdominal pain in pregnancy Back pain in pregnancy  Elevated liver enzymes Hypokalemia  P:  Replace potassium  RUQ [place]    Chanika Byland Irene Parissa Chiao, NP 08/08/2014  8:19 AM

## 2014-08-09 LAB — BASIC METABOLIC PANEL
ANION GAP: 6 (ref 5–15)
BUN: <5 mg/dL — ABNORMAL LOW (ref 6–23)
CALCIUM: 7.7 mg/dL — AB (ref 8.4–10.5)
CO2: 20 mmol/L (ref 19–32)
CREATININE: 0.42 mg/dL — AB (ref 0.50–1.10)
Chloride: 110 mEq/L (ref 96–112)
GFR calc Af Amer: >90 mL/min (ref >90)
Glucose, Bld: 85 mg/dL (ref 70–99)
Potassium: 3.7 mmol/L (ref 3.5–5.1)
Sodium: 136 mmol/L (ref 135–145)

## 2014-08-09 LAB — CBC
HCT: 24.2 % — ABNORMAL LOW (ref 36.0–46.0)
Hemoglobin: 8.2 g/dL — ABNORMAL LOW (ref 12.0–15.0)
MCH: 30.5 pg (ref 26.0–34.0)
MCHC: 33.9 g/dL (ref 30.0–36.0)
MCV: 90 fL (ref 78.0–100.0)
Platelets: 231 10*3/uL (ref 150–400)
RBC: 2.69 MIL/uL — ABNORMAL LOW (ref 3.87–5.11)
RDW: 13.3 % (ref 11.5–15.5)
WBC: 10.4 10*3/uL (ref 4.0–10.5)

## 2014-08-09 LAB — AMYLASE: AMYLASE: 164 U/L — AB (ref 0–105)

## 2014-08-09 LAB — HEPATIC FUNCTION PANEL
ALK PHOS: 128 U/L — AB (ref 39–117)
ALT: 89 U/L — AB (ref 0–35)
AST: 80 U/L — AB (ref 0–37)
Albumin: 2.5 g/dL — ABNORMAL LOW (ref 3.5–5.2)
BILIRUBIN DIRECT: 0.1 mg/dL (ref 0.0–0.3)
Indirect Bilirubin: 0.4 mg/dL (ref 0.3–0.9)
Total Bilirubin: 0.5 mg/dL (ref 0.3–1.2)
Total Protein: 5.4 g/dL — ABNORMAL LOW (ref 6.0–8.3)

## 2014-08-09 MED ORDER — OXYCODONE-ACETAMINOPHEN 5-325 MG PO TABS
1.0000 | ORAL_TABLET | Freq: Four times a day (QID) | ORAL | Status: DC | PRN
  Administered 2014-08-09 – 2014-08-10 (×3): 2 via ORAL
  Filled 2014-08-09 (×3): qty 2

## 2014-08-09 MED ORDER — SODIUM CHLORIDE 0.9 % IJ SOLN: 10.0000 mL | Freq: Two times a day (BID) | INTRAMUSCULAR | Status: DC

## 2014-08-09 MED ORDER — FERROUS SULFATE 325 (65 FE) MG PO TABS: 325.0000 mg | ORAL_TABLET | Freq: Every day | ORAL | Status: DC

## 2014-08-09 MED ORDER — MORPHINE SULFATE 4 MG/ML IJ SOLN: 2.0000 mg | INTRAMUSCULAR | Status: DC | PRN

## 2014-08-09 NOTE — Progress Notes (Signed)
Patient ID: Elgie CollardBrittany L [name], female   DOB: 11/27/1985, 28 y.o.   MRN: [id number]  Lab review - Potassium 3.6, Hgb 8.2, Amylase decreased to 164; LFTs:  AST 80, decreased ALT 89, increased/stable; tbili 0.5 (decreased from 0.9)  Overall improved

## 2014-08-09 NOTE — Progress Notes (Signed)
    Progress Note   Subjective  *Denies abdominal pain.  Hungry.**   Objective  Vital signs in last 24 hours: Temp:  [97.6 F (36.4 C)-98.2 F (36.8 C)] 97.9 F (36.6 C) (12/25 1244) Pulse Rate:  [75-155] 79 (12/25 1703) Resp:  [14-20] 18 (12/25 1703) BP: (85-118)/(44-65) 85/62 mmHg (12/25 1703) SpO2:  [96 %-100 %] 100 % (12/25 1000)   General:   Alert,  Well-developed,   in NAD Heart:  Regular rate and rhythm; no murmurs Abdomen:  Soft, nontender and nondistended. Normal bowel sounds, without guarding, and without rebound.   Extremities:  Without edema. Neurologic:  Alert and  oriented x4;  grossly normal neurologically. Psych:  Alert and cooperative. Normal mood and affect.  Intake/Output from previous day:   Intake/Output this shift:    Lab Results:  Recent Labs  08/08/14 0625 08/09/14 0543  WBC 12.9* 10.4  HGB 8.8* 8.2*  HCT 26.0* 24.2*  PLT 240 231   BMET  Recent Labs  08/08/14 0625 08/09/14 0543  NA 137 136  K 2.8* 3.7  CL 107 110  CO2 22 20  GLUCOSE 110* 85  BUN 8 <5*  CREATININE 0.44* 0.42*  CALCIUM 8.4 7.7*   LFT  Recent Labs  08/09/14 0543  PROT 5.4*  ALBUMIN 2.5*  AST 80*  ALT 89*  ALKPHOS 128*  BILITOT 0.5  BILIDIR 0.1  IBILI 0.4   PT/INR No results for input(s): LABPROT, INR in the last 72 hours. Hepatitis Panel No results for input(s): HEPBSAG, HCVAB, HEPAIGM, HEPBIGM in the last 72 hours.  Studies/Results: [place] Abdomen Complete  08/08/2014   CLINICAL DATA:  Elevated LFTs  EXAM: ULTRASOUND ABDOMEN COMPLETE  COMPARISON:  04/05/2005  FINDINGS: Gallbladder: Small mobile gallstones are noted within gallbladder the largest measures 5 mm. No thickening of gallbladder wall. No sonographic Murphy's sign.  Common bile duct: Diameter: 3.2 mm in diameter within normal limits.  Liver: No focal lesion identified. Mild heterogeneous echogenicity without intrahepatic biliary ductal dilatation.  IVC: No abnormality visualized.  Pancreas:  Visualized portion unremarkable. Pancreatic tail not visualized due to abundant bowel gas.  Spleen: Size and appearance within normal limits. Measures 5.5 cm in length.  Right Kidney: Length: 13 cm. Echogenicity within normal limits. No mass or hydronephrosis visualized.  Left Kidney: Length: 13 cm. Echogenicity within normal limits. No mass or hydronephrosis visualized.  Abdominal aorta: No aneurysm visualized. Measures up to 1.7 cm in diameter.  Other findings: None.  IMPRESSION: 1. Small gallstones are noted within gallbladder the largest measures 5 mm. No sonographic Murphy's sign. 2. Normal CBD. 3. No hydronephrosis or renal calculus. 4. No focal hepatic mass. 5. No aortic aneurysm.   Electronically Signed   By: Natasha MeadLiviu  Pop M.D.   On: 08/08/2014 09:25      Assessment & Plan  *Resolving pancreatitis, probably due to gallbladder stones.  Recommend*advancing diet, d/c in am. Elective cholecystectomy after childbirth. * Principal Problem:   Pancreatitis, acute Active Problems:   Twin pregnancy in third trimester  Signing off   LOS: 1 day   Mackenzie HeapsRobert Sherilee [name]  08/09/2014, 5:29 PM

## 2014-08-09 NOTE — Progress Notes (Signed)
Patient ID: Elgie CollardBrittany L Krul, female   DOB: 08/29/1985, 28 y.o.   MRN: [id number]  29+ with pancreatitis, twin IUP  No c/o's, except hungry - will await GI clearance for diet, +FM, no LOF, no VB, occ cramping.  Sore in pelvis.  Not using PCA a lot.    AFVSS Labs P gen NAD Abd soft, NT FHTs 130's, good var, category 1  130-140, good var, category 1  toco uterine irritability, no ctx  Cont current mgmt IVF, PCA for pain Advance diet per GI, follow labs

## 2014-08-10 LAB — COMPREHENSIVE METABOLIC PANEL
ALBUMIN: 2.8 g/dL — AB (ref 3.5–5.2)
ALT: 67 U/L — ABNORMAL HIGH (ref 0–35)
ANION GAP: 6 (ref 5–15)
AST: 42 U/L — ABNORMAL HIGH (ref 0–37)
Alkaline Phosphatase: 134 U/L — ABNORMAL HIGH (ref 39–117)
BUN: 7 mg/dL (ref 6–23)
CHLORIDE: 109 meq/L (ref 96–112)
CO2: 21 mmol/L (ref 19–32)
CREATININE: 0.44 mg/dL — AB (ref 0.50–1.10)
Calcium: 8.2 mg/dL — ABNORMAL LOW (ref 8.4–10.5)
GFR calc Af Amer: >90 mL/min (ref >90)
Glucose, Bld: 88 mg/dL (ref 70–99)
Potassium: 4.1 mmol/L (ref 3.5–5.1)
Sodium: 136 mmol/L (ref 135–145)
Total Bilirubin: 0.4 mg/dL (ref 0.3–1.2)
Total Protein: 5.9 g/dL — ABNORMAL LOW (ref 6.0–8.3)

## 2014-08-10 LAB — AMYLASE: Amylase: 78 U/L (ref 0–105)

## 2014-08-10 MED ORDER — OXYCODONE-ACETAMINOPHEN 5-325 MG PO TABS: 1.0000 | ORAL_TABLET | Freq: Four times a day (QID) | ORAL | Status: DC | PRN

## 2014-08-10 MED ORDER — FERROUS SULFATE 325 (65 FE) MG PO TABS: 325.0000 mg | ORAL_TABLET | Freq: Every day | ORAL | Status: DC

## 2014-08-10 NOTE — Progress Notes (Signed)
Pt's Morphine PCA was discontinued and the remaining 5cc was wis by Sunday SpillersGigi Ragnar Waas, RN and witnessed by Danella DeisPatty Phillips, RN. @ [phone] 08/09/14

## 2014-08-10 NOTE — Progress Notes (Signed)
Patient ID: [name], female   DOB: 09/16/1985, 28 y.o.   MRN: [id number] HD 3 Gallstone pancreatitis/twins  Pt feeling much improved, tolerating regular diet.  Some heartburn.   Taking percocet for mild residual epigastric pain and low back pain  afeb vss LFT's normalizing 40's/60's Amylase normal in 70's   Twin gestation at 29 5/7 with resolving gallstone pancreatitis  D/c home with percocet and has protonix at home D/w pt low fat diet. Has f/u appt with Dr. Ambrose MantleHenley 08/15/14

## 2014-08-10 NOTE — OB Triage Note (Signed)
Pt in stable condition. Discharged to home with family. Pt able to voice understanding of all discharge instructions.

## 2014-08-10 NOTE — Discharge Summary (Signed)
Physician Discharge Summary  Patient ID: [name] MRN: [id number] DOB/AGE: 26/09/1985 26 y.o.  Admit date: 08/08/2014 Discharge date: 08/10/2014  Admission Diagnoses:  Discharge Diagnoses:  Principal Problem:   Pancreatitis, acute Active Problems:   Twin pregnancy in third trimester   Discharged Condition: good  Hospital Course: Pt admitted with severe abdominal pain and elevated LFT's and amylase/lipase c/w gallstone pancreatitis.  Was made NPO and given PCA for pain and made rapid improvement.  By HD #3, was tolerating a regular diet and LFT's improved to 40's/60's and amylase normalized.  Fetal monitoring was reassuring throughout stay.  Consults: GI  Significant Diagnostic Studies: radiology: abdominal US, labs  Treatments: bowel rest and pain control  Discharge Exam: Blood pressure 104/52, pulse 88, temperature 97.9 F (36.6 C), temperature source Oral, resp. rate 16, height 5\' 7"  (1.702 m), weight 86.183 kg (190 lb), last menstrual period 01/14/2014, SpO2 98 %. General appearance: alert and cooperative GI: soft, gravid NT  Disposition: 01-Home or Self Care  Low fat diet To call with recurrent symptoms Keep appt with Dr. Ambrose MantleHenley 12/31  Discharge Instructions    Diet - low sodium heart healthy    Complete by:  As directed      Discharge instructions    Complete by:  As directed   Call for return of severe pain or persistent nausea and vomiting     Increase activity slowly    Complete by:  As directed             Medication List    STOP taking these medications        acetaminophen 500 MG tablet  Commonly known as:  TYLENOL     Butalbital-APAP-Caffeine 50-300-40 MG Caps     nitrofurantoin (macrocrystal-monohydrate) 100 MG capsule  Commonly known as:  MACROBID     traMADol 50 MG tablet  Commonly known as:  ULTRAM      TAKE these medications        albuterol 108 (90 BASE) MCG/ACT inhaler  Commonly known as:  PROVENTIL HFA;VENTOLIN HFA   Inhale 2 puffs into the lungs every 6 (six) hours as needed for wheezing or shortness of breath.     chlorhexidine 0.12 % solution  Commonly known as:  PERIDEX  every morning.     diphenhydrAMINE 25 MG tablet  Commonly known as:  BENADRYL  Take 25 mg by mouth every 6 (six) hours as needed for itching or allergies (allergies).     ferrous sulfate 325 (65 FE) MG tablet  Take 1 tablet (325 mg total) by mouth daily with breakfast.     oxyCODONE-acetaminophen 5-325 MG per tablet  Commonly known as:  PERCOCET/ROXICET  Take 1-2 tablets by mouth every 6 (six) hours as needed for moderate pain.     prenatal multivitamin Tabs tablet  Take 1 tablet by mouth 2 (two) times daily.     promethazine 12.5 MG tablet  Commonly known as:  PHENERGAN  Take 12.5 mg by mouth every 4 (four) hours as needed for nausea or vomiting (nausea).     sertraline 50 MG tablet  Commonly known as:  ZOLOFT  Take 50 mg by mouth daily.           Follow-up Information    Follow up with Bing PlumeHENLEY,THOMAS F, MD In 5 days.   Specialty:  Obstetrics and Gynecology   Why:  keep appointment 12/31 as scheduled   Contact information:   [address], [address] [place] [id number]  [phone]       Signed: Oliver PilaICHARDSON,[place] Spees W 08/10/2014, 9:43 AM

## 2014-08-10 NOTE — Progress Notes (Signed)
Morphine PCA discontinued and the remaining 5 cc was wasted in sink by Sunday SpillersGiGi Whitfield RN and witnessed by Danella DeisPatty Tashunda Vandezande RN on 08/09/2014 at 19:30.

## 2014-08-13 ENCOUNTER — Inpatient Hospital Stay (HOSPITAL_COMMUNITY)
Admission: AD | Admit: 2014-08-13 | Discharge: 2014-08-13 | Disposition: A | Discharge status: Home / Self Care | Payer: Medicaid Other | Source: Ambulatory Visit | Attending: Obstetrics and Gynecology | Admitting: Obstetrics and Gynecology

## 2014-08-13 ENCOUNTER — Encounter (HOSPITAL_COMMUNITY): Payer: Self-pay | Admitting: *Deleted

## 2014-08-13 DIAGNOSIS — M5430 Sciatica, unspecified side: Secondary | ICD-10-CM | POA: Insufficient documentation

## 2014-08-13 DIAGNOSIS — N949 Unspecified condition associated with female genital organs and menstrual cycle: Secondary | ICD-10-CM | POA: Diagnosis not present

## 2014-08-13 DIAGNOSIS — K859 Acute pancreatitis, unspecified: Secondary | ICD-10-CM | POA: Insufficient documentation

## 2014-08-13 DIAGNOSIS — O99333 Smoking (tobacco) complicating pregnancy, third trimester: Secondary | ICD-10-CM | POA: Insufficient documentation

## 2014-08-13 DIAGNOSIS — Z3A30 30 weeks gestation of pregnancy: Secondary | ICD-10-CM | POA: Insufficient documentation

## 2014-08-13 DIAGNOSIS — F17210 Nicotine dependence, cigarettes, uncomplicated: Secondary | ICD-10-CM | POA: Insufficient documentation

## 2014-08-13 DIAGNOSIS — K851 Biliary acute pancreatitis without necrosis or infection: Secondary | ICD-10-CM

## 2014-08-13 DIAGNOSIS — R109 Unspecified abdominal pain: Secondary | ICD-10-CM | POA: Diagnosis present

## 2014-08-13 DIAGNOSIS — O9989 Other specified diseases and conditions complicating pregnancy, childbirth and the puerperium: Secondary | ICD-10-CM | POA: Diagnosis not present

## 2014-08-13 DIAGNOSIS — O26893 Other specified pregnancy related conditions, third trimester: Secondary | ICD-10-CM

## 2014-08-13 DIAGNOSIS — M549 Dorsalgia, unspecified: Secondary | ICD-10-CM

## 2014-08-13 DIAGNOSIS — O30003 Twin pregnancy, unspecified number of placenta and unspecified number of amniotic sacs, third trimester: Secondary | ICD-10-CM

## 2014-08-13 LAB — URINALYSIS, ROUTINE W REFLEX MICROSCOPIC
Bilirubin Urine: NEGATIVE
Glucose, UA: NEGATIVE mg/dL
Hgb urine dipstick: NEGATIVE
Ketones, ur: 15 mg/dL — AB
NITRITE: NEGATIVE
PH: 7 (ref 5.0–8.0)
Protein, ur: NEGATIVE mg/dL
SPECIFIC GRAVITY, URINE: 1.01 (ref 1.005–1.030)
Urobilinogen, UA: 0.2 mg/dL (ref 0.0–1.0)

## 2014-08-13 LAB — CBC WITH DIFFERENTIAL/PLATELET
BASOS PCT: 0 % (ref 0–1)
Basophils Absolute: 0 10*3/uL (ref 0.0–0.1)
Eosinophils Absolute: 0.2 10*3/uL (ref 0.0–0.7)
Eosinophils Relative: 1 % (ref 0–5)
HCT: 25.8 % — ABNORMAL LOW (ref 36.0–46.0)
Hemoglobin: 8.8 g/dL — ABNORMAL LOW (ref 12.0–15.0)
LYMPHS ABS: 3.2 10*3/uL (ref 0.7–4.0)
Lymphocytes Relative: 28 % (ref 12–46)
MCH: 30.6 pg (ref 26.0–34.0)
MCHC: 34.1 g/dL (ref 30.0–36.0)
MCV: 89.6 fL (ref 78.0–100.0)
Monocytes Absolute: 0.7 10*3/uL (ref 0.1–1.0)
Monocytes Relative: 6 % (ref 3–12)
NEUTROS ABS: 7.3 10*3/uL (ref 1.7–7.7)
NEUTROS PCT: 65 % (ref 43–77)
Platelets: 249 10*3/uL (ref 150–400)
RBC: 2.88 MIL/uL — AB (ref 3.87–5.11)
RDW: 13.3 % (ref 11.5–15.5)
WBC: 11.3 10*3/uL — ABNORMAL HIGH (ref 4.0–10.5)

## 2014-08-13 LAB — COMPREHENSIVE METABOLIC PANEL
ALBUMIN: 2.8 g/dL — AB (ref 3.5–5.2)
ALK PHOS: 116 U/L (ref 39–117)
ALT: 29 U/L (ref 0–35)
AST: 16 U/L (ref 0–37)
Anion gap: 7 (ref 5–15)
BUN: 8 mg/dL (ref 6–23)
CO2: 24 mmol/L (ref 19–32)
Calcium: 8.5 mg/dL (ref 8.4–10.5)
Chloride: 107 mEq/L (ref 96–112)
Creatinine, Ser: 0.41 mg/dL — ABNORMAL LOW (ref 0.50–1.10)
GFR calc Af Amer: >90 mL/min (ref >90)
GFR calc non Af Amer: >90 mL/min (ref >90)
GLUCOSE: 86 mg/dL (ref 70–99)
POTASSIUM: 3.5 mmol/L (ref 3.5–5.1)
SODIUM: 138 mmol/L (ref 135–145)
TOTAL PROTEIN: 6.1 g/dL (ref 6.0–8.3)
Total Bilirubin: 0.3 mg/dL (ref 0.3–1.2)

## 2014-08-13 LAB — URINE MICROSCOPIC-ADD ON

## 2014-08-13 LAB — AMYLASE: Amylase: 64 U/L (ref 0–105)

## 2014-08-13 LAB — LIPASE, BLOOD: Lipase: 40 U/L (ref 11–59)

## 2014-08-13 NOTE — MAU Provider Note (Signed)
History     CSN: [id number]  Arrival date and time: 08/13/14 7829   First Provider Initiated Contact with Patient 08/13/14 0257      Chief Complaint  Patient presents with  . Back Pain   HPI  [name] is a 28 y.o. [phone] at [redacted]w[redacted]d who presents to [facility]U today with complaint of back pain and abdominal pain. Patient has twin gestation without other known complications. She was recently admitted for acute pancreatitis. She states that mid back pain is similar to when she was diagnosed with pancreatitis however now the pain also radiates down her legs. This pain is worse on the right then the left. She also complains of lower abdominal pain/pressure that is worse with ambulation and change or positions. She denies vaginal bleeding or LOF. She does endorse N/V for which she is taking Phenergan and Zofran. She reports good fetal movement. She states only occasional mild contractions.    OB History    Gravida Para Term Preterm AB TAB SAB Ectopic Multiple Living   6 3 3  0 2 0 2 0 0 3      Past Medical History  Diagnosis Date  . Asthma   . Headache(784.0)     migraines  . Depression   . PIH (pregnancy induced hypertension)   . Late prenatal care     22wks  . Anemia 2007  . HPV (human papilloma virus) anogenital infection   . Recurrent UTI   . Abnormal Pap smear   . Chlamydia infection     has no taken treatment as of 01/03/2013  . SVD (spontaneous vaginal delivery) 01/08/2013  . Vaginal Pap smear, abnormal   . Twin pregnancy in third trimester 08/08/2014  . Pancreatitis, acute 08/08/2014    Past Surgical History  Procedure Laterality Date  . No past surgeries      Family History  Problem Relation Age of Onset  . Anesthesia problems Neg Hx   . Hypotension Neg Hx   . Malignant hyperthermia Neg Hx   . Pseudochol deficiency Neg Hx   . Heart disease Maternal Grandmother     clots in heart  . Stroke Maternal Grandmother   . Diabetes Mother   . Heart disease Maternal  Grandfather     pacemaker  . Miscarriages / Stillbirths Paternal Aunt   . Stroke Paternal Grandmother     History  Substance Use Topics  . Smoking status: Current Every Day Smoker -- 0.25 packs/day for 5 years    Types: Cigarettes  . Smokeless tobacco: Never Used  . Alcohol Use: No    Allergies:  Allergies  Allergen Reactions  . Corn Syrup Anaphylaxis and Hives    Prescriptions prior to admission  Medication Sig Dispense Refill Last Dose  . chlorhexidine (PERIDEX) 0.12 % solution every morning.  0 08/12/2014 at Unknown time  . diphenhydrAMINE (BENADRYL) 25 MG tablet Take 25 mg by mouth every 6 (six) hours as needed for itching or allergies (allergies).    Past Week at Unknown time  . ferrous sulfate 325 (65 FE) MG tablet Take 1 tablet (325 mg total) by mouth daily with breakfast. 30 tablet 3 08/12/2014 at Unknown time  . oxyCODONE-acetaminophen (PERCOCET/ROXICET) 5-325 MG per tablet Take 1-2 tablets by mouth every 6 (six) hours as needed for moderate pain. 20 tablet 0 08/12/2014 at Unknown time  . Prenatal Vit-Fe Fumarate-FA (PRENATAL MULTIVITAMIN) TABS tablet Take 1 tablet by mouth 2 (two) times daily.   08/12/2014 at Unknown time  .  promethazine (PHENERGAN) 12.5 MG tablet Take 12.5 mg by mouth every 4 (four) hours as needed for nausea or vomiting (nausea).   08/12/2014 at Unknown time  . sertraline (ZOLOFT) 50 MG tablet Take 50 mg by mouth daily.  1 08/12/2014 at Unknown time  . albuterol (PROVENTIL HFA;VENTOLIN HFA) 108 (90 BASE) MCG/ACT inhaler Inhale 2 puffs into the lungs every 6 (six) hours as needed for wheezing or shortness of breath.   More than a month at Unknown time    Review of Systems  Constitutional: Negative for fever and malaise/fatigue.  Gastrointestinal: Positive for nausea, vomiting and abdominal pain. Negative for diarrhea and constipation.  Genitourinary: Negative for dysuria, urgency and frequency.       Neg - vaginal bleeding  Musculoskeletal: Positive  for back pain.   Physical Exam   Blood pressure 106/67, pulse 98, temperature 97.6 F (36.4 C), temperature source Oral, resp. rate 18, last menstrual period 01/14/2014.  Physical Exam  Constitutional: She is oriented to person, place, and time. She appears well-developed and well-nourished. No distress.  HENT:  Head: Normocephalic.  Cardiovascular: Normal rate.   Respiratory: Effort normal.  GI: Soft. She exhibits no distension and no mass. There is tenderness (mild tenderness to palpation of the suprapubic region). There is no rebound, no guarding and no CVA tenderness.  Musculoskeletal:       Thoracic back: She exhibits tenderness and pain. She exhibits normal range of motion, no bony tenderness, no swelling, no edema and no spasm.  + straight leg raise on right> left  Neurological: She is alert and oriented to person, place, and time.  Skin: Skin is warm and dry. No erythema.  Psychiatric: She has a normal mood and affect.  Dilation: Closed Effacement (%): Thick Exam by:: Magnus SinningWenzel, PA  Results for orders placed or performed during the hospital encounter of 08/13/14 (from the past 24 hour(s))  Urinalysis, Routine w reflex microscopic     Status: Abnormal   Collection Time: 08/13/14  3:05 AM  Result Value Ref Range   Color, Urine YELLOW YELLOW   APPearance CLEAR CLEAR   Specific Gravity, Urine 1.010 1.005 - 1.030   pH 7.0 5.0 - 8.0   Glucose, UA NEGATIVE NEGATIVE mg/dL   Hgb urine dipstick NEGATIVE NEGATIVE   Bilirubin Urine NEGATIVE NEGATIVE   Ketones, ur 15 (A) NEGATIVE mg/dL   Protein, ur NEGATIVE NEGATIVE mg/dL   Urobilinogen, UA 0.2 0.0 - 1.0 mg/dL   Nitrite NEGATIVE NEGATIVE   Leukocytes, UA LARGE (A) NEGATIVE  Urine microscopic-add on     Status: Abnormal   Collection Time: 08/13/14  3:05 AM  Result Value Ref Range   Squamous Epithelial / LPF FEW (A) RARE   WBC, UA 7-10 <3 WBC/hpf   Bacteria, UA FEW (A) RARE   Urine-Other MUCOUS PRESENT   CBC with Differential      Status: Abnormal   Collection Time: 08/13/14  3:12 AM  Result Value Ref Range   WBC 11.3 (H) 4.0 - 10.5 K/uL   RBC 2.88 (L) 3.87 - 5.11 MIL/uL   Hemoglobin 8.8 (L) 12.0 - 15.0 g/dL   HCT 47.825.8 (L) 29.536.0 - 62.146.0 %   MCV 89.6 78.0 - 100.0 fL   MCH 30.6 26.0 - 34.0 pg   MCHC 34.1 30.0 - 36.0 g/dL   RDW 30.813.3 65.711.5 - 84.615.5 %   Platelets 249 150 - 400 K/uL   Neutrophils Relative % 65 43 - 77 %   Neutro Abs 7.3 1.7 -  7.7 K/uL   Lymphocytes Relative 28 12 - 46 %   Lymphs Abs 3.2 0.7 - 4.0 K/uL   Monocytes Relative 6 3 - 12 %   Monocytes Absolute 0.7 0.1 - 1.0 K/uL   Eosinophils Relative 1 0 - 5 %   Eosinophils Absolute 0.2 0.0 - 0.7 K/uL   Basophils Relative 0 0 - 1 %   Basophils Absolute 0.0 0.0 - 0.1 K/uL  Comprehensive metabolic panel     Status: Abnormal   Collection Time: 08/13/14  3:12 AM  Result Value Ref Range   Sodium 138 135 - 145 mmol/L   Potassium 3.5 3.5 - 5.1 mmol/L   Chloride 107 96 - 112 mEq/L   CO2 24 19 - 32 mmol/L   Glucose, Bld 86 70 - 99 mg/dL   BUN 8 6 - 23 mg/dL   Creatinine, Ser 1.610.41 (L) 0.50 - 1.10 mg/dL   Calcium 8.5 8.4 - 09.610.5 mg/dL   Total Protein 6.1 6.0 - 8.3 g/dL   Albumin 2.8 (L) 3.5 - 5.2 g/dL   AST 16 0 - 37 U/L   ALT 29 0 - 35 U/L   Alkaline Phosphatase 116 39 - 117 U/L   Total Bilirubin 0.3 0.3 - 1.2 mg/dL   GFR calc non Af Amer >90 >90 mL/min   GFR calc Af Amer >90 >90 mL/min   Anion gap 7 5 - 15   Fetal Monitoring: Baseline A: 120 bpm, moderate variability, + accelerations, no decelerations Baseline B: 125 bpm, moderate variability, + accelerations, no decelerations Contractions: none, mild UI  MAU Course  Procedures None  MDM UA, CBC, CMP, Amylase and Lipase today Discussed patient with Dr. Jackelyn KnifeMeisinger. Ok for discharge. Follow-up as scheduled  Assessment and Plan  A: Twin IUP at 2345w1d Round ligament pain Back pain in pregnancy, third trimester Sciatic nerve pain Acute pancreatitis  P: Discharge home Discussed use of Tylenol  PRN for pain Patient advised to continue Phenergan and Zofran PRN for nausea Patient encouraged to use abdominal binder Preterm labor precautions discussed Patient to follow-up in the office as scheduled for routine prenatal care Patient may return to MAU as needed or if her condition were to change or worsen   Marny LowensteinJulie N Wenzel, PA-C  08/13/2014, 4:22 AM

## 2014-08-13 NOTE — Discharge Instructions (Signed)
Acute Pancreatitis Acute pancreatitis is a disease in which the pancreas becomes suddenly inflamed. The pancreas is a large gland located behind your stomach. The pancreas produces enzymes that help digest food. The pancreas also releases the hormones glucagon and insulin that help regulate blood sugar. Damage to the pancreas occurs when the digestive enzymes from the pancreas are activated and begin attacking the pancreas before being released into the intestine. Most acute attacks last a couple of days and can cause serious complications. Some people become dehydrated and develop low blood pressure. In severe cases, bleeding into the pancreas can lead to shock and can be life-threatening. The lungs, heart, and kidneys may fail. CAUSES  Pancreatitis can happen to anyone. In some cases, the cause is unknown. Most cases are caused by:  Alcohol abuse.  Gallstones. Other less common causes are:  Certain medicines.  Exposure to certain chemicals.  Infection.  Damage caused by an accident (trauma).  Abdominal surgery. SYMPTOMS   Pain in the upper abdomen that may radiate to the back.  Tenderness and swelling of the abdomen.  Nausea and vomiting. DIAGNOSIS  Your caregiver will perform a physical exam. Blood and stool tests may be done to confirm the diagnosis. Imaging tests may also be done, such as X-rays, CT scans, or an ultrasound of the abdomen. TREATMENT  Treatment usually requires a stay in the hospital. Treatment may include:  Pain medicine.  Fluid replacement through an intravenous line (IV).  Placing a tube in the stomach to remove stomach contents and control vomiting.  Not eating for 3 or 4 days. This gives your pancreas a rest, because enzymes are not being produced that can cause further damage.  Antibiotic medicines if your condition is caused by an infection.  Surgery of the pancreas or gallbladder. HOME CARE INSTRUCTIONS   Follow the diet advised by your  caregiver. This may involve avoiding alcohol and decreasing the amount of fat in your diet.  Eat smaller, more frequent meals. This reduces the amount of digestive juices the pancreas produces.  Drink enough fluids to keep your urine clear or pale yellow.  Only take over-the-counter or prescription medicines as directed by your caregiver.  Avoid drinking alcohol if it caused your condition.  Do not smoke.  Get plenty of rest.  Check your blood sugar at home as directed by your caregiver.  Keep all follow-up appointments as directed by your caregiver. SEEK MEDICAL CARE IF:   You do not recover as quickly as expected.  You develop new or worsening symptoms.  You have persistent pain, weakness, or nausea.  You recover and then have another episode of pain. SEEK IMMEDIATE MEDICAL CARE IF:   You are unable to eat or keep fluids down.  Your pain becomes severe.  You have a fever or persistent symptoms for more than 2 to 3 days.  You have a fever and your symptoms suddenly get worse.  Your skin or the white part of your eyes turn yellow (jaundice).  You develop vomiting.  You feel dizzy, or you faint.  Your blood sugar is high (over 300 mg/dL). MAKE SURE YOU:   Understand these instructions.  Will watch your condition.  Will get help right away if you are not doing well or get worse. Document Released: 08/02/2005 Document Revised: 02/01/2012 Document Reviewed: 11/11/2011 [facility] Patient Information 2015 [place], [place]. This information is not intended to replace advice given to you by your health care provider. Make sure you discuss any questions you have  with your health care provider. Back Pain in Pregnancy Back pain during pregnancy is common. It happens in about half of all pregnancies. It is important for you and your baby that you remain active during your pregnancy.If you feel that back pain is not allowing you to remain active or sleep well, it is time to see  your caregiver. Back pain may be caused by several factors related to changes during your pregnancy.Fortunately, unless you had trouble with your back before your pregnancy, the pain is likely to get better after you deliver. Low back pain usually occurs between the fifth and seventh months of pregnancy. It can, however, happen in the first couple months. Factors that increase the risk of back problems include:   Previous back problems.  Injury to your back.  Having twins or multiple births.  A chronic cough.  Stress.  Job-related repetitive motions.  Muscle or spinal disease in the back.  Family history of back problems, ruptured (herniated) discs, or osteoporosis.  Depression, anxiety, and panic attacks. CAUSES   When you are pregnant, your body produces a hormone called relaxin. This hormonemakes the ligaments connecting the low back and pubic bones more flexible. This flexibility allows the baby to be delivered more easily. When your ligaments are loose, your muscles need to work harder to support your back. Soreness in your back can come from tired muscles. Soreness can also come from back tissues that are irritated since they are receiving less support.  As the baby grows, it puts pressure on the nerves and blood vessels in your pelvis. This can cause back pain.  As the baby grows and gets heavier during pregnancy, the uterus pushes the stomach muscles forward and changes your center of gravity. This makes your back muscles work harder to maintain good posture. SYMPTOMS  Lumbar pain during pregnancy Lumbar pain during pregnancy usually occurs at or above the waist in the center of the back. There may be pain and numbness that radiates into your leg or foot. This is similar to low back pain experienced by non-pregnant women. It usually increases with sitting for long periods of time, standing, or repetitive lifting. Tenderness may also be present in the muscles along your upper  back. Posterior pelvic pain during pregnancy Pain in the back of the pelvis is more common than lumbar pain in pregnancy. It is a deep pain felt in your side at the waistline, or across the tailbone (sacrum), or in both places. You may have pain on one or both sides. This pain can also go into the buttocks and backs of the upper thighs. Pubic and groin pain may also be present. The pain does not quickly resolve with rest, and morning stiffness may also be present. Pelvic pain during pregnancy can be brought on by most activities. A high level of fitness before and during pregnancy may or may not prevent this problem. Labor pain is usually 1 to 2 minutes apart, lasts for about 1 minute, and involves a bearing down feeling or pressure in your pelvis. However, if you are at term with the pregnancy, constant low back pain can be the beginning of early labor, and you should be aware of this. DIAGNOSIS  X-rays of the back should not be done during the first 12 to 14 weeks of the pregnancy and only when absolutely necessary during the rest of the pregnancy. MRIs do not give off radiation and are safe during pregnancy. MRIs also should only be done when absolutely  necessary. HOME CARE INSTRUCTIONS  Exercise as directed by your caregiver. Exercise is the most effective way to prevent or manage back pain. If you have a back problem, it is especially important to avoid sports that require sudden body movements. Swimming and walking are great activities.  Do not stand in one place for long periods of time.  Do not wear high heels.  Sit in chairs with good posture. Use a pillow on your lower back if necessary. Make sure your head rests over your shoulders and is not hanging forward.  Try sleeping on your side, preferably the left side, with a pillow or two between your legs. If you are sore after a night's rest, your bedmay betoo soft.Try placing a board between your mattress and box spring.  Listen to your  body when lifting.If you are experiencing pain, ask for help or try bending yourknees more so you can use your leg muscles rather than your back muscles. Squat down when picking up something from the floor. Do not bend over.  Eat a healthy diet. Try to gain weight within your caregiver's recommendations.  Use heat or cold packs 3 to 4 times a day for 15 minutes to help with the pain.  Only take over-the-counter or prescription medicines for pain, discomfort, or fever as directed by your caregiver. Sudden (acute) back pain  Use bed rest for only the most extreme, acute episodes of back pain. Prolonged bed rest over 48 hours will aggravate your condition.  Ice is very effective for acute conditions.  Put ice in a plastic bag.  Place a towel between your skin and the bag.  Leave the ice on for 10 to 20 minutes every 2 hours, or as needed.  Using heat packs for 30 minutes prior to activities is also helpful. Continued back pain See your caregiver if you have continued problems. Your caregiver can help or refer you for appropriate physical therapy. With conditioning, most back problems can be avoided. Sometimes, a more serious issue may be the cause of back pain. You should be seen right away if new problems seem to be developing. Your caregiver may recommend:  A maternity girdle.  An elastic sling.  A back brace.  A massage therapist or acupuncture. SEEK MEDICAL CARE IF:   You are not able to do most of your daily activities, even when taking the pain medicine you were given.  You need a referral to a physical therapist or chiropractor.  You want to try acupuncture. SEEK IMMEDIATE MEDICAL CARE IF:  You develop numbness, tingling, weakness, or problems with the use of your arms or legs.  You develop severe back pain that is no longer relieved with medicines.  You have a sudden change in bowel or bladder control.  You have increasing pain in other areas of the body.  You  develop shortness of breath, dizziness, or fainting.  You develop nausea, vomiting, or sweating.  You have back pain which is similar to labor pains.  You have back pain along with your water breaking or vaginal bleeding.  You have back pain or numbness that travels down your leg.  Your back pain developed after you fell.  You develop pain on one side of your back. You may have a kidney stone.  You see blood in your urine. You may have a bladder infection or kidney stone.  You have back pain with blisters. You may have shingles. Back pain is fairly common during pregnancy but should not  be accepted as just part of the process. Back pain should always be treated as soon as possible. This will make your pregnancy as pleasant as possible. Document Released: 11/10/2005 Document Revised: 10/25/2011 Document Reviewed: 12/22/2010 [facility] Patient Information 2015 [place], [place]. This information is not intended to replace advice given to you by your health care provider. Make sure you discuss any questions you have with your health care provider. Abdominal Pain During Pregnancy Belly (abdominal) pain is common during pregnancy. Most of the time, it is not a serious problem. Other times, it can be a sign that something is wrong with the pregnancy. Always tell your doctor if you have belly pain. HOME CARE Monitor your belly pain for any changes. The following actions may help you feel better:  Do not have sex (intercourse) or put anything in your vagina until you feel better.  Rest until your pain stops.  Drink clear fluids if you feel sick to your stomach (nauseous). Do not eat solid food until you feel better.  Only take medicine as told by your doctor.  Keep all doctor visits as told. GET HELP RIGHT AWAY IF:   You are bleeding, leaking fluid, or pieces of tissue come out of your vagina.  You have more pain or cramping.  You keep throwing up (vomiting).  You have pain when you pee  (urinate) or have blood in your pee.  You have a fever.  You do not feel your baby moving as much.  You feel very weak or feel like passing out.  You have trouble breathing, with or without belly pain.  You have a very bad headache and belly pain.  You have fluid leaking from your vagina and belly pain.  You keep having watery poop (diarrhea).  Your belly pain does not go away after resting, or the pain gets worse. MAKE SURE YOU:   Understand these instructions.  Will watch your condition.  Will get help right away if you are not doing well or get worse. Document Released: 07/21/2009 Document Revised: 04/04/2013 Document Reviewed: 03/01/2013 [facility] Patient Information 2015 [place], [place]. This information is not intended to replace advice given to you by your health care provider. Make sure you discuss any questions you have with your health care provider.

## 2014-08-13 NOTE — MAU Note (Signed)
Pt states that she started having back pain this past Saturday.Says that it feels like burning pins and needles from her back down to her feet. Denies vaginal bleeding. Positive mucous discharge. Positive fetal movement x2. Periodic contractions only. Denies any risk factors for pregnancy except twins.

## 2014-08-15 ENCOUNTER — Inpatient Hospital Stay (HOSPITAL_COMMUNITY)
Admission: AD | Admit: 2014-08-15 | Discharge: 2014-08-15 | Disposition: A | Discharge status: Home / Self Care | Payer: Medicaid Other | Source: Ambulatory Visit | Attending: Obstetrics and Gynecology | Admitting: Obstetrics and Gynecology

## 2014-08-15 ENCOUNTER — Encounter (HOSPITAL_COMMUNITY): Payer: Self-pay | Admitting: *Deleted

## 2014-08-15 DIAGNOSIS — O9989 Other specified diseases and conditions complicating pregnancy, childbirth and the puerperium: Secondary | ICD-10-CM | POA: Diagnosis not present

## 2014-08-15 DIAGNOSIS — R1013 Epigastric pain: Secondary | ICD-10-CM | POA: Insufficient documentation

## 2014-08-15 DIAGNOSIS — O26899 Other specified pregnancy related conditions, unspecified trimester: Secondary | ICD-10-CM

## 2014-08-15 DIAGNOSIS — O0930 Supervision of pregnancy with insufficient antenatal care, unspecified trimester: Secondary | ICD-10-CM | POA: Diagnosis not present

## 2014-08-15 DIAGNOSIS — F17210 Nicotine dependence, cigarettes, uncomplicated: Secondary | ICD-10-CM | POA: Diagnosis not present

## 2014-08-15 DIAGNOSIS — O99333 Smoking (tobacco) complicating pregnancy, third trimester: Secondary | ICD-10-CM | POA: Insufficient documentation

## 2014-08-15 DIAGNOSIS — R109 Unspecified abdominal pain: Secondary | ICD-10-CM

## 2014-08-15 DIAGNOSIS — Z3A30 30 weeks gestation of pregnancy: Secondary | ICD-10-CM | POA: Insufficient documentation

## 2014-08-15 DIAGNOSIS — O30003 Twin pregnancy, unspecified number of placenta and unspecified number of amniotic sacs, third trimester: Secondary | ICD-10-CM

## 2014-08-15 LAB — AMYLASE: AMYLASE: 55 U/L (ref 0–105)

## 2014-08-15 LAB — CBC
HCT: 26.3 % — ABNORMAL LOW (ref 36.0–46.0)
Hemoglobin: 8.9 g/dL — ABNORMAL LOW (ref 12.0–15.0)
MCH: 30.4 pg (ref 26.0–34.0)
MCHC: 33.8 g/dL (ref 30.0–36.0)
MCV: 89.8 fL (ref 78.0–100.0)
Platelets: 266 10*3/uL (ref 150–400)
RBC: 2.93 MIL/uL — ABNORMAL LOW (ref 3.87–5.11)
RDW: 13.3 % (ref 11.5–15.5)
WBC: 11.3 10*3/uL — AB (ref 4.0–10.5)

## 2014-08-15 LAB — COMPREHENSIVE METABOLIC PANEL
ALBUMIN: 2.8 g/dL — AB (ref 3.5–5.2)
ALK PHOS: 131 U/L — AB (ref 39–117)
ALT: 18 U/L (ref 0–35)
AST: 15 U/L (ref 0–37)
Anion gap: 7 (ref 5–15)
BILIRUBIN TOTAL: 0.3 mg/dL (ref 0.3–1.2)
BUN: 7 mg/dL (ref 6–23)
CO2: 23 mmol/L (ref 19–32)
Calcium: 8.8 mg/dL (ref 8.4–10.5)
Chloride: 106 mEq/L (ref 96–112)
Creatinine, Ser: 0.45 mg/dL — ABNORMAL LOW (ref 0.50–1.10)
GFR calc Af Amer: >90 mL/min (ref >90)
GFR calc non Af Amer: >90 mL/min (ref >90)
Glucose, Bld: 95 mg/dL (ref 70–99)
POTASSIUM: 3.5 mmol/L (ref 3.5–5.1)
SODIUM: 136 mmol/L (ref 135–145)
Total Protein: 5.8 g/dL — ABNORMAL LOW (ref 6.0–8.3)

## 2014-08-15 LAB — LIPASE, BLOOD: Lipase: 29 U/L (ref 11–59)

## 2014-08-15 MED ORDER — OXYCODONE-ACETAMINOPHEN 5-325 MG PO TABS: 1.0000 | ORAL_TABLET | Freq: Four times a day (QID) | ORAL | Status: DC | PRN

## 2014-08-15 MED ORDER — GI COCKTAIL ~~LOC~~
30.0000 mL | Freq: Once | ORAL | Status: AC
  Administered 2014-08-15: 30 mL via ORAL
  Filled 2014-08-15: qty 30

## 2014-08-15 NOTE — MAU Note (Signed)
Pt has same pain upper abd radiating to back since IP visit last week, busy day, tried to rest and could not rest due to pain, Dx with pancreatitis last week, states pain same with heaviness upper abd, points to mid sterum, to BR and then Bed in Room #5

## 2014-08-15 NOTE — Discharge Instructions (Signed)
Abdominal Pain During Pregnancy °Abdominal pain is common in pregnancy. Most of the time, it does not cause harm. There are many causes of abdominal pain. Some causes are more serious than others. Some of the causes of abdominal pain in pregnancy are easily diagnosed. Occasionally, the diagnosis takes time to understand. Other times, the cause is not determined. Abdominal pain can be a sign that something is very wrong with the pregnancy, or the pain may have nothing to do with the pregnancy at all. For this reason, always tell your health care provider if you have any abdominal discomfort. °HOME CARE INSTRUCTIONS  °Monitor your abdominal pain for any changes. The following actions may help to alleviate any discomfort you are experiencing: °· Do not have sexual intercourse or put anything in your vagina until your symptoms go away completely. °· Get plenty of rest until your pain improves. °· Drink clear fluids if you feel nauseous. Avoid solid food as long as you are uncomfortable or nauseous. °· Only take over-the-counter or prescription medicine as directed by your health care provider. °· Keep all follow-up appointments with your health care provider. °SEEK IMMEDIATE MEDICAL CARE IF: °· You are bleeding, leaking fluid, or passing tissue from the vagina. °· You have increasing pain or cramping. °· You have persistent vomiting. °· You have painful or bloody urination. °· You have a fever. °· You notice a decrease in your baby's movements. °· You have extreme weakness or feel faint. °· You have shortness of breath, with or without abdominal pain. °· You develop a severe headache with abdominal pain. °· You have abnormal vaginal discharge with abdominal pain. °· You have persistent diarrhea. °· You have abdominal pain that continues even after rest, or gets worse. °MAKE SURE YOU:  °· Understand these instructions. °· Will watch your condition. °· Will get help right away if you are not doing well or get  worse. °Document Released: 08/02/2005 Document Revised: 05/23/2013 Document Reviewed: 03/01/2013 °ExitCare® Patient Information ©2015 [employer]. This information is not intended to replace advice given to you by your health care provider. Make sure you discuss any questions you have with your health care provider. °Food Choices for Gastroesophageal Reflux Disease °When you have gastroesophageal reflux disease (GERD), the foods you eat and your eating habits are very important. Choosing the right foods can help ease the discomfort of GERD. °WHAT GENERAL GUIDELINES DO I NEED TO FOLLOW? °· Choose fruits, vegetables, whole grains, low-fat dairy products, and low-fat meat, fish, and poultry. °· Limit fats such as oils, salad dressings, butter, nuts, and avocado. °· Keep a food diary to identify foods that cause symptoms. °· Avoid foods that cause reflux. These may be different for different people. °· Eat frequent small meals instead of three large meals each day. °· Eat your meals slowly, in a relaxed setting. °· Limit fried foods. °· Cook foods using methods other than frying. °· Avoid drinking alcohol. °· Avoid drinking large amounts of liquids with your meals. °· Avoid bending over or lying down until 2-3 hours after eating. °WHAT FOODS ARE NOT RECOMMENDED? °The following are some foods and drinks that may worsen your symptoms: °Vegetables °Tomatoes. Tomato juice. Tomato and spaghetti sauce. Chili peppers. Onion and garlic. Horseradish. °Fruits °Oranges, grapefruit, and lemon (fruit and juice). °Meats °High-fat meats, fish, and poultry. This includes hot dogs, ribs, ham, sausage, salami, and bacon. °Dairy °Whole milk and chocolate milk. Sour cream. Cream. Butter. Ice cream. Cream cheese.  °Beverages °Coffee and tea, with   or without caffeine. Carbonated beverages or energy drinks. °Condiments °Hot sauce. Barbecue sauce.  °Sweets/Desserts °Chocolate and cocoa. Donuts. Peppermint and spearmint. °Fats and  Oils °High-fat foods, including French fries and potato chips. °Other °Vinegar. Strong spices, such as black pepper, white pepper, red pepper, cayenne, curry powder, cloves, ginger, and chili powder. °The items listed above may not be a complete list of foods and beverages to avoid. Contact your dietitian for more information. °Document Released: 08/02/2005 Document Revised: 08/07/2013 Document Reviewed: 06/06/2013 °ExitCare® Patient Information ©2015 [employer]. This information is not intended to replace advice given to you by your health care provider. Make sure you discuss any questions you have with your health care provider. ° °

## 2014-08-15 NOTE — MAU Note (Signed)
Urine in lab 

## 2014-08-15 NOTE — MAU Provider Note (Signed)
History     CSN: [id number]  Arrival date and time: 08/15/14 1840   First Provider Initiated Contact with Patient 08/15/14 1916      No chief complaint on file.  HPI  [name] is a 28 y.o. [phone] at 91w3dwho presents today with epigastric pain. She was recently DC on 08/10/14 from ante with pancreatitis from gallstones. She was also seen again on the 29th for the pain again, and was DC home from MAU. She states that this pain is similar to the pain she had initially with the pancreatitis. However, it came on much more suddenly today. She was up and very busy today, and she took percocet around 0900. She said that she was carrying her son around a lot today. She states that she saw Dr. MWillis Modenaearlier today. She states that he checked her BP and had a flu shot. She was told to return if her pain came back. She saw Dr. MWillis Modenaat 1000 am, and the pain returned around 1400.   She denies any contrations, VB or LOF. She states that both fetuses have been active.   Past Medical History  Diagnosis Date  . Asthma   . Headache(784.0)     migraines  . Depression   . PIH (pregnancy induced hypertension)   . Late prenatal care     22wks  . Anemia 2007  . HPV (human papilloma virus) anogenital infection   . Recurrent UTI   . Abnormal Pap smear   . Chlamydia infection     has no taken treatment as of 01/03/2013  . SVD (spontaneous vaginal delivery) 01/08/2013  . Vaginal Pap smear, abnormal   . Twin pregnancy in third trimester 08/08/2014  . Pancreatitis, acute 08/08/2014    Past Surgical History  Procedure Laterality Date  . No past surgeries      Family History  Problem Relation Age of Onset  . Anesthesia problems Neg Hx   . Hypotension Neg Hx   . Malignant hyperthermia Neg Hx   . Pseudochol deficiency Neg Hx   . Heart disease Maternal Grandmother     clots in heart  . Stroke Maternal Grandmother   . Diabetes Mother   . Heart disease Maternal Grandfather    pacemaker  . Miscarriages / Stillbirths Paternal Aunt   . Stroke Paternal Grandmother     History  Substance Use Topics  . Smoking status: Current Every Day Smoker -- 0.25 packs/day for 5 years    Types: Cigarettes  . Smokeless tobacco: Never Used  . Alcohol Use: No    Allergies:  Allergies  Allergen Reactions  . Corn Syrup Anaphylaxis and Hives    Prescriptions prior to admission  Medication Sig Dispense Refill Last Dose  . albuterol (PROVENTIL HFA;VENTOLIN HFA) 108 (90 BASE) MCG/ACT inhaler Inhale 2 puffs into the lungs every 6 (six) hours as needed for wheezing or shortness of breath.   More than a month at Unknown time  . chlorhexidine (PERIDEX) 0.12 % solution every morning.  0 08/12/2014 at Unknown time  . diphenhydrAMINE (BENADRYL) 25 MG tablet Take 25 mg by mouth every 6 (six) hours as needed for itching or allergies (allergies).    Past Week at Unknown time  . ferrous sulfate 325 (65 FE) MG tablet Take 1 tablet (325 mg total) by mouth daily with breakfast. 30 tablet 3 08/12/2014 at Unknown time  . oxyCODONE-acetaminophen (PERCOCET/ROXICET) 5-325 MG per tablet Take 1-2 tablets by mouth every 6 (six) hours as  needed for moderate pain. 20 tablet 0 08/12/2014 at Unknown time  . Prenatal Vit-Fe Fumarate-FA (PRENATAL MULTIVITAMIN) TABS tablet Take 1 tablet by mouth 2 (two) times daily.   08/12/2014 at Unknown time  . promethazine (PHENERGAN) 12.5 MG tablet Take 12.5 mg by mouth every 4 (four) hours as needed for nausea or vomiting (nausea).   08/12/2014 at Unknown time  . sertraline (ZOLOFT) 50 MG tablet Take 50 mg by mouth daily.  1 08/12/2014 at Unknown time    ROS Physical Exam   Last menstrual period 01/14/2014.  Physical Exam  Nursing note and vitals reviewed. Constitutional: She is oriented to person, place, and time. She appears well-developed and well-nourished. No distress.  Cardiovascular: Normal rate.   Respiratory: Effort normal.  GI: Soft. There is tenderness  (epigastric tendeness, midline and to the right > than the left ). There is no rebound and no guarding.  Neurological: She is alert and oriented to person, place, and time.  Skin: Skin is warm and dry.  Psychiatric: She has a normal mood and affect.    FHT a: 145, moderate with 15x15 accels, no decels FHT b: 130, moderate with 15x15 accels, no decels Toco: no UCs   Oxygen saturation consistently 98-100% MAU Course  Procedures  2052 Care turned over to [facility] Amylase and Lipase pending Patient reports minimal relief with GI cocktail.  Mathis Bud  08/15/2014 8:53 PM   Results for orders placed or performed during the hospital encounter of 08/15/14 (from the past 48 hour(s))  CBC     Status: Abnormal   Collection Time: 08/15/14  7:15 PM  Result Value Ref Range   WBC 11.3 (H) 4.0 - 10.5 K/uL   RBC 2.93 (L) 3.87 - 5.11 MIL/uL   Hemoglobin 8.9 (L) 12.0 - 15.0 g/dL   HCT 26.3 (L) 36.0 - 46.0 %   MCV 89.8 78.0 - 100.0 fL   MCH 30.4 26.0 - 34.0 pg   MCHC 33.8 30.0 - 36.0 g/dL   RDW 13.3 11.5 - 15.5 %   Platelets 266 150 - 400 K/uL  Comprehensive metabolic panel     Status: Abnormal   Collection Time: 08/15/14  7:15 PM  Result Value Ref Range   Sodium 136 135 - 145 mmol/L    Comment: Please note change in reference range.   Potassium 3.5 3.5 - 5.1 mmol/L    Comment: Please note change in reference range.   Chloride 106 96 - 112 mEq/L   CO2 23 19 - 32 mmol/L   Glucose, Bld 95 70 - 99 mg/dL   BUN 7 6 - 23 mg/dL   Creatinine, Ser 0.45 (L) 0.50 - 1.10 mg/dL   Calcium 8.8 8.4 - 10.5 mg/dL   Total Protein 5.8 (L) 6.0 - 8.3 g/dL   Albumin 2.8 (L) 3.5 - 5.2 g/dL   AST 15 0 - 37 U/L   ALT 18 0 - 35 U/L   Alkaline Phosphatase 131 (H) 39 - 117 U/L   Total Bilirubin 0.3 0.3 - 1.2 mg/dL   GFR calc non Af Amer >90 >90 mL/min   GFR calc Af Amer >90 >90 mL/min    Comment: (NOTE) The eGFR has been calculated using the CKD EPI equation. This calculation has not  been validated in all clinical situations. eGFR's persistently <90 mL/min signify possible Chronic Kidney Disease.    Anion gap 7 5 - 15  Amylase     Status: None   Collection Time: 08/15/14  7:15 PM  Result Value Ref Range   Amylase 55 0 - 105 U/L    Comment: Performed at [facility]  Lipase, blood     Status: None   Collection Time: 08/15/14  7:15 PM  Result Value Ref Range   Lipase 29 11 - 59 U/L    Comment: Performed at [facility]   Discussed with Dr. Ulanda Edison.  He agrees for discharge of pt and advises for Zantac use PRN. Assessment and Plan  A: Abdominal pain in pregnancy  P: Discharge to home OTC Zantac PRN Follow up in clinic as scheduled Patient may return to MAU as needed or if her condition were to change or worsen Pt given limited quantity of percocet per request.

## 2014-08-16 NOTE — L&D Delivery Note (Signed)
Delivery Note   Tawanna Coolerodd, Girl [place] [[id number]][[id number]]  Pt reached complete and was taken to the OR.  Baby A was on the perineum once she was put into stirrups, she had to do minimal pushing.  At 10:02 PM a viable female was delivered via Vaginal, Spontaneous Delivery (Presentation: Right Occiput Anterior).  APGAR: pending; weight 4 lb 5.1 oz (1960 g).    Anesthesia: Epidural  Episiotomy: None Lacerations: None Suture Repair: none Est. Blood Loss (mL): 500    Sherley Boundsodd, GirlB [place] [[id number]][[id number]]  After Baby A delivered, Baby B confirmed to be in transverse position with Cat I FHT.  I was unable to do an external version to vertex, but I was able to palpate both feet.  Both feet were grasped and membranes ruptured.  With pushing, legs easily delivered and a cord around the leg was reduced.  Baby then delivered to the chest, arms were swept across the chest and the head then easily delivered.  At 10:16 PM a viable female was delivered via Vaginal, Spontaneous Delivery (Presentation: double footling breech).  APGAR: pending; weight pending.   Placenta status: Intact, Spontaneous.  Cord:  with the following complications: None.  Anesthesia: Epidural  Episiotomy: None Lacerations: None Suture Repair: none Est. Blood Loss (mL): 500   Mom to postpartum.   Baby A to NICU.   Baby B to NICU.  Alex Leahy,Pharr D 09/18/2014, 10:29 PM

## 2014-08-16 NOTE — Progress Notes (Signed)
FHT from 12-29 reviewed.  Reactive NST x 2, no significant decels, no regular ctx.

## 2014-08-24 ENCOUNTER — Inpatient Hospital Stay (HOSPITAL_COMMUNITY)
Admission: AD | Admit: 2014-08-24 | Discharge: 2014-08-25 | Disposition: A | Discharge status: Home / Self Care | Payer: Medicaid Other | Source: Ambulatory Visit | Attending: Obstetrics and Gynecology | Admitting: Obstetrics and Gynecology

## 2014-08-24 DIAGNOSIS — O99333 Smoking (tobacco) complicating pregnancy, third trimester: Secondary | ICD-10-CM | POA: Insufficient documentation

## 2014-08-24 DIAGNOSIS — F17210 Nicotine dependence, cigarettes, uncomplicated: Secondary | ICD-10-CM | POA: Diagnosis not present

## 2014-08-24 DIAGNOSIS — Z3A31 31 weeks gestation of pregnancy: Secondary | ICD-10-CM | POA: Diagnosis not present

## 2014-08-24 DIAGNOSIS — O30003 Twin pregnancy, unspecified number of placenta and unspecified number of amniotic sacs, third trimester: Secondary | ICD-10-CM | POA: Insufficient documentation

## 2014-08-24 DIAGNOSIS — O4703 False labor before 37 completed weeks of gestation, third trimester: Secondary | ICD-10-CM

## 2014-08-24 LAB — URINALYSIS, ROUTINE W REFLEX MICROSCOPIC
Bilirubin Urine: NEGATIVE
GLUCOSE, UA: NEGATIVE mg/dL
Hgb urine dipstick: NEGATIVE
Ketones, ur: 15 mg/dL — AB
Nitrite: NEGATIVE
PROTEIN: NEGATIVE mg/dL
Specific Gravity, Urine: 1.025 (ref 1.005–1.030)
UROBILINOGEN UA: 0.2 mg/dL (ref 0.0–1.0)
pH: 6.5 (ref 5.0–8.0)

## 2014-08-24 LAB — URINE MICROSCOPIC-ADD ON

## 2014-08-24 NOTE — MAU Note (Signed)
Has had hx PTL and pancreatitis with this preg. Was hospitalized for 3 days 12/24 -12/26. Contractions today since 1730. Denies LOF or bleeding

## 2014-08-25 ENCOUNTER — Encounter (HOSPITAL_COMMUNITY): Payer: Self-pay | Admitting: Emergency Medicine

## 2014-08-25 DIAGNOSIS — O4703 False labor before 37 completed weeks of gestation, third trimester: Secondary | ICD-10-CM

## 2014-08-25 DIAGNOSIS — O30003 Twin pregnancy, unspecified number of placenta and unspecified number of amniotic sacs, third trimester: Secondary | ICD-10-CM

## 2014-08-25 DIAGNOSIS — Z3A31 31 weeks gestation of pregnancy: Secondary | ICD-10-CM

## 2014-08-25 MED ORDER — TERBUTALINE SULFATE 1 MG/ML IJ SOLN
0.2500 mg | Freq: Once | INTRAMUSCULAR | Status: AC
  Administered 2014-08-25: 0.25 mg via SUBCUTANEOUS
  Filled 2014-08-25: qty 1

## 2014-08-25 NOTE — MAU Provider Note (Signed)
Chief Complaint:  Contractions   None     HPI: [name] is a 29 y.o. [phone] at 1544w6d who presents to maternity admissions reporting contractions every 5 minutes and painful x 4-5 hours.  She reports good fetal movement, denies LOF, vaginal bleeding, vaginal itching/burning, urinary symptoms, h/a, dizziness, n/v, or fever/chills.    Past Medical History: Past Medical History  Diagnosis Date  . Asthma   . Headache(784.0)     migraines  . Depression   . PIH (pregnancy induced hypertension)   . Late prenatal care     22wks  . Anemia 2007  . HPV (human papilloma virus) anogenital infection   . Recurrent UTI   . Abnormal Pap smear   . Chlamydia infection     has no taken treatment as of 01/03/2013  . SVD (spontaneous vaginal delivery) 01/08/2013  . Vaginal Pap smear, abnormal   . Twin pregnancy in third trimester 08/08/2014  . Pancreatitis, acute 08/08/2014    Past obstetric history: OB History  Gravida Para Term Preterm AB SAB TAB Ectopic Multiple Living  6 3 3  0 2 2 0 0 0 3    # Outcome Date GA Lbr Len/2nd Weight Sex Delivery Anes PTL Lv  6 Current           5 Term 01/06/13 8875w2d 17:11 / 00:04  M Vag-Spont EPI  Y     Comments: none  4 Term 07/23/11 6171w5d 06:03 / 00:06 3.535 kg (7 lb 12.7 oz) F Vag-Spont EPI  Y     Comments: none  3 SAB 2011          2 Term 2008 1855w0d 18:00 3.799 kg (8 lb 6 oz) F Vag-Spont EPI  Y     Comments: anemia  1 SAB               Past Surgical History: Past Surgical History  Procedure Laterality Date  . No past surgeries      Family History: Family History  Problem Relation Age of Onset  . Anesthesia problems Neg Hx   . Hypotension Neg Hx   . Malignant hyperthermia Neg Hx   . Pseudochol deficiency Neg Hx   . Heart disease Maternal Grandmother     clots in heart  . Stroke Maternal Grandmother   . Diabetes Mother   . Heart disease Maternal Grandfather     pacemaker  . Miscarriages / Stillbirths Paternal Aunt   . Stroke  Paternal Grandmother     Social History: History  Substance Use Topics  . Smoking status: Current Every Day Smoker -- 0.25 packs/day for 5 years    Types: Cigarettes  . Smokeless tobacco: Never Used  . Alcohol Use: No    Allergies:  Allergies  Allergen Reactions  . Corn Syrup Anaphylaxis and Hives    Meds:  No prescriptions prior to admission    ROS: Pertinent findings in history of present illness.  Physical Exam  Blood pressure 127/66, pulse 103, temperature 97.3 F (36.3 C), temperature source Oral, resp. rate 20, height 5' 7.5" (1.715 m), weight 88.905 kg (196 lb), last menstrual period 01/14/2014. GENERAL: Well-developed, well-nourished female in no acute distress.  HEENT: normocephalic HEART: normal rate RESP: normal effort ABDOMEN: Soft, non-tender, gravid appropriate for gestational age EXTREMITIES: Nontender, no edema NEURO: alert and oriented  Dilation: 1 Effacement (%): Thick Cervical Position: Posterior Station: -3 Exam by:: Misty StanleyLisa, CNM   Cervix unchanged in 1.5 hours in MAU   FHT Baby  A:  Baseline 130, moderate variability, accelerations present, no decelerations FHT Baby B:  Baseline 125, moderate variability, accelerations present, no decelerations Contractions: q 5-7 mins, mild to palpation   Labs: Results for orders placed or performed during the hospital encounter of 08/24/14 (from the past 24 hour(s))  Urinalysis, Routine w reflex microscopic     Status: Abnormal   Collection Time: 08/24/14 11:02 PM  Result Value Ref Range   Color, Urine YELLOW YELLOW   APPearance CLEAR CLEAR   Specific Gravity, Urine 1.025 1.005 - 1.030   pH 6.5 5.0 - 8.0   Glucose, UA NEGATIVE NEGATIVE mg/dL   Hgb urine dipstick NEGATIVE NEGATIVE   Bilirubin Urine NEGATIVE NEGATIVE   Ketones, ur 15 (A) NEGATIVE mg/dL   Protein, ur NEGATIVE NEGATIVE mg/dL   Urobilinogen, UA 0.2 0.0 - 1.0 mg/dL   Nitrite NEGATIVE NEGATIVE   Leukocytes, UA TRACE (A) NEGATIVE  Urine  microscopic-add on     Status: Abnormal   Collection Time: 08/24/14 11:02 PM  Result Value Ref Range   Squamous Epithelial / LPF RARE RARE   WBC, UA 3-6 <3 WBC/hpf   Bacteria, UA FEW (A) RARE   Urine-Other MUCOUS PRESENT      Assessment: 1. Twin pregnancy in third trimester   2. Preterm contractions, third trimester     Plan: Consult Dr Ambrose Mantle Terbutaline 0.25 x 1 dose in MAU. Pt reports complete resolution of symptoms in MAU. Discharge home PTL precautions and fetal kick counts     Follow-up Information    Follow up with Bing Plume, MD.   Specialty:  Obstetrics and Gynecology   Why:  Next week   Contact information:   [address] [place] [address] [phone]       Follow up with [facility] ADMISS[facility].   Why:  As needed for emergencies   Contact information:   [address] [id number] mc [place] [address] [phone]       Medication List    TAKE these medications        acetaminophen 500 MG tablet  Commonly known as:  TYLENOL  Take 1,000 mg by mouth every 6 (six) hours as needed for mild pain or headache.     albuterol 108 (90 BASE) MCG/ACT inhaler  Commonly known as:  PROVENTIL HFA;VENTOLIN HFA  Inhale 2 puffs into the lungs every 6 (six) hours as needed for wheezing or shortness of breath.     chlorhexidine 0.12 % solution  Commonly known as:  PERIDEX  Swish and spit 10 mLs every morning.     diphenhydrAMINE 25 MG tablet  Commonly known as:  BENADRYL  Take 25 mg by mouth every 6 (six) hours as needed for itching or allergies (allergies).     ferrous sulfate 325 (65 FE) MG tablet  Take 1 tablet (325 mg total) by mouth daily with breakfast.     ondansetron 8 MG tablet  Commonly known as:  ZOFRAN  Take 8 mg by mouth every 8 (eight) hours as needed for nausea or vomiting.     oxyCODONE-acetaminophen 5-325 MG per tablet  Commonly known as:  PERCOCET/ROXICET   Take 1-2 tablets by mouth every 6 (six) hours as needed for moderate pain.     prenatal multivitamin Tabs tablet  Take 1 tablet by mouth 2 (two) times daily.     promethazine 12.5 MG tablet  Commonly known as:  PHENERGAN  Take 12.5 mg by mouth  2 (two) times daily as needed for nausea or vomiting (nausea).     sertraline 50 MG tablet  Commonly known as:  ZOLOFT  Take 50 mg by mouth daily.        Sharen Counter Certified Nurse-Midwife 08/25/2014 3:20 AM

## 2014-08-25 NOTE — Discharge Instructions (Signed)
Abdominal Pain During Pregnancy Abdominal pain is common in pregnancy. Most of the time, it does not cause harm. There are many causes of abdominal pain. Some causes are more serious than others. Some of the causes of abdominal pain in pregnancy are easily diagnosed. Occasionally, the diagnosis takes time to understand. Other times, the cause is not determined. Abdominal pain can be a sign that something is very wrong with the pregnancy, or the pain may have nothing to do with the pregnancy at all. For this reason, always tell your health care provider if you have any abdominal discomfort. HOME CARE INSTRUCTIONS  Monitor your abdominal pain for any changes. The following actions may help to alleviate any discomfort you are experiencing:  Do not have sexual intercourse or put anything in your vagina until your symptoms go away completely.  Get plenty of rest until your pain improves.  Drink clear fluids if you feel nauseous. Avoid solid food as long as you are uncomfortable or nauseous.  Only take over-the-counter or prescription medicine as directed by your health care provider.  Keep all follow-up appointments with your health care provider. SEEK IMMEDIATE MEDICAL CARE IF:  You are bleeding, leaking fluid, or passing tissue from the vagina.  You have increasing pain or cramping.  You have persistent vomiting.  You have painful or bloody urination.  You have a fever.  You notice a decrease in your baby's movements.  You have extreme weakness or feel faint.  You have shortness of breath, with or without abdominal pain.  You develop a severe headache with abdominal pain.  You have abnormal vaginal discharge with abdominal pain.  You have persistent diarrhea.  You have abdominal pain that continues even after rest, or gets worse. MAKE SURE YOU:   Understand these instructions.  Will watch your condition.  Will get help right away if you are not doing well or get  worse. Document Released: 08/02/2005 Document Revised: 05/23/2013 Document Reviewed: 03/01/2013 [facility] Patient Information 2015 [place], [place]. This information is not intended to replace advice given to you by your health care provider. Make sure you discuss any questions you have with your health care provider.  Braxton Hicks Contractions Contractions of the uterus can occur throughout pregnancy. Contractions are not always a sign that you are in labor.  WHAT ARE BRAXTON HICKS CONTRACTIONS?  Contractions that occur before labor are called Braxton Hicks contractions, or false labor. Toward the end of pregnancy (32-34 weeks), these contractions can develop more often and may become more forceful. This is not true labor because these contractions do not result in opening (dilatation) and thinning of the cervix. They are sometimes difficult to tell apart from true labor because these contractions can be forceful and people have different pain tolerances. You should not feel embarrassed if you go to the hospital with false labor. Sometimes, the only way to tell if you are in true labor is for your health care provider to look for changes in the cervix. If there are no prenatal problems or other health problems associated with the pregnancy, it is completely safe to be sent home with false labor and await the onset of true labor. HOW CAN YOU TELL THE DIFFERENCE BETWEEN TRUE AND FALSE LABOR? False Labor  The contractions of false labor are usually shorter and not as hard as those of true labor.   The contractions are usually irregular.   The contractions are often felt in the front of the lower abdomen and in the  groin.   °· The contractions may go away when you walk around or change positions while lying down.   °· The contractions get weaker and are shorter lasting as time goes on.   °· The contractions do not usually become progressively stronger, regular, and closer together as with true labor.    °True Labor °· Contractions in true labor last 30-70 seconds, become very regular, usually become more intense, and increase in frequency.   °· The contractions do not go away with walking.   °· The discomfort is usually felt in the top of the uterus and spreads to the lower abdomen and low back.   °· True labor can be determined by your health care provider with an exam. This will show that the cervix is dilating and getting thinner.   °WHAT TO REMEMBER °· Keep up with your usual exercises and follow other instructions given by your health care provider.   °· Take medicines as directed by your health care provider.   °· Keep your regular prenatal appointments.   °· Eat and drink lightly if you think you are going into labor.   °· If Braxton Hicks contractions are making you uncomfortable:   °¨ Change your position from lying down or resting to walking, or from walking to resting.   °¨ Sit and rest in a tub of warm water.   °¨ Drink 2-3 glasses of water. Dehydration may cause these contractions.   °¨ Do slow and deep breathing several times an hour.   °WHEN SHOULD I SEEK IMMEDIATE MEDICAL CARE? °Seek immediate medical care if: °· Your contractions become stronger, more regular, and closer together.   °· You have fluid leaking or gushing from your vagina.   °· You have a fever.   °· You pass blood-tinged mucus.   °· You have vaginal bleeding.   °· You have continuous abdominal pain.   °· You have low back pain that you never had before.   °· You feel your baby's head pushing down and causing pelvic pressure.   °· Your baby is not moving as much as it used to.   °Document Released: 08/02/2005 Document Revised: 08/07/2013 Document Reviewed: 05/14/2013 °[employer]® Patient Information ©2015 [employer]. This information is not intended to replace advice given to you by your health care provider. Make sure you discuss any questions you have with your health care provider. ° °

## 2014-08-25 NOTE — Progress Notes (Signed)
P.o. hydration 

## 2014-08-30 ENCOUNTER — Inpatient Hospital Stay (HOSPITAL_COMMUNITY): Payer: Medicaid Other

## 2014-08-30 ENCOUNTER — Inpatient Hospital Stay (HOSPITAL_COMMUNITY)
Admission: AD | Admit: 2014-08-30 | Discharge: 2014-09-20 | DRG: 774 | Disposition: A | Discharge status: Home / Self Care | Payer: Medicaid Other | Source: Ambulatory Visit | Attending: Obstetrics and Gynecology | Admitting: Obstetrics and Gynecology

## 2014-08-30 ENCOUNTER — Encounter (HOSPITAL_COMMUNITY): Payer: Self-pay | Admitting: *Deleted

## 2014-08-30 DIAGNOSIS — O99334 Smoking (tobacco) complicating childbirth: Secondary | ICD-10-CM | POA: Diagnosis present

## 2014-08-30 DIAGNOSIS — O328XX2 Maternal care for other malpresentation of fetus, fetus 2: Secondary | ICD-10-CM | POA: Diagnosis present

## 2014-08-30 DIAGNOSIS — O30043 Twin pregnancy, dichorionic/diamniotic, third trimester: Secondary | ICD-10-CM | POA: Diagnosis present

## 2014-08-30 DIAGNOSIS — A749 Chlamydial infection, unspecified: Secondary | ICD-10-CM | POA: Diagnosis present

## 2014-08-30 DIAGNOSIS — Z87440 Personal history of urinary (tract) infections: Secondary | ICD-10-CM | POA: Diagnosis not present

## 2014-08-30 DIAGNOSIS — A630 Anogenital (venereal) warts: Secondary | ICD-10-CM | POA: Diagnosis present

## 2014-08-30 DIAGNOSIS — O99513 Diseases of the respiratory system complicating pregnancy, third trimester: Secondary | ICD-10-CM | POA: Diagnosis present

## 2014-08-30 DIAGNOSIS — F17210 Nicotine dependence, cigarettes, uncomplicated: Secondary | ICD-10-CM | POA: Diagnosis present

## 2014-08-30 DIAGNOSIS — R102 Pelvic and perineal pain: Secondary | ICD-10-CM

## 2014-08-30 DIAGNOSIS — O9882 Other maternal infectious and parasitic diseases complicating childbirth: Secondary | ICD-10-CM | POA: Diagnosis present

## 2014-08-30 DIAGNOSIS — Z3A31 31 weeks gestation of pregnancy: Secondary | ICD-10-CM | POA: Diagnosis present

## 2014-08-30 DIAGNOSIS — J45909 Unspecified asthma, uncomplicated: Secondary | ICD-10-CM | POA: Diagnosis present

## 2014-08-30 DIAGNOSIS — O9832 Other infections with a predominantly sexual mode of transmission complicating childbirth: Secondary | ICD-10-CM | POA: Diagnosis present

## 2014-08-30 DIAGNOSIS — O42913 Preterm premature rupture of membranes, unspecified as to length of time between rupture and onset of labor, third trimester: Secondary | ICD-10-CM | POA: Diagnosis present

## 2014-08-30 DIAGNOSIS — O26899 Other specified pregnancy related conditions, unspecified trimester: Secondary | ICD-10-CM

## 2014-08-30 DIAGNOSIS — O30003 Twin pregnancy, unspecified number of placenta and unspecified number of amniotic sacs, third trimester: Secondary | ICD-10-CM

## 2014-08-30 DIAGNOSIS — O9902 Anemia complicating childbirth: Secondary | ICD-10-CM | POA: Diagnosis present

## 2014-08-30 DIAGNOSIS — Z23 Encounter for immunization: Secondary | ICD-10-CM | POA: Diagnosis not present

## 2014-08-30 DIAGNOSIS — O99343 Other mental disorders complicating pregnancy, third trimester: Secondary | ICD-10-CM | POA: Diagnosis present

## 2014-08-30 DIAGNOSIS — O133 Gestational [pregnancy-induced] hypertension without significant proteinuria, third trimester: Secondary | ICD-10-CM | POA: Diagnosis present

## 2014-08-30 DIAGNOSIS — K851 Biliary acute pancreatitis without necrosis or infection: Secondary | ICD-10-CM | POA: Diagnosis present

## 2014-08-30 DIAGNOSIS — O2662 Liver and biliary tract disorders in childbirth: Secondary | ICD-10-CM | POA: Diagnosis present

## 2014-08-30 DIAGNOSIS — O26613 Liver and biliary tract disorders in pregnancy, third trimester: Secondary | ICD-10-CM | POA: Diagnosis present

## 2014-08-30 DIAGNOSIS — F329 Major depressive disorder, single episode, unspecified: Secondary | ICD-10-CM | POA: Diagnosis present

## 2014-08-30 DIAGNOSIS — K859 Acute pancreatitis without necrosis or infection, unspecified: Secondary | ICD-10-CM | POA: Diagnosis present

## 2014-08-30 DIAGNOSIS — D649 Anemia, unspecified: Secondary | ICD-10-CM | POA: Diagnosis present

## 2014-08-30 DIAGNOSIS — R1011 Right upper quadrant pain: Secondary | ICD-10-CM

## 2014-08-30 HISTORY — DX: Biliary acute pancreatitis without necrosis or infection: K85.10

## 2014-08-30 HISTORY — DX: Gestational (pregnancy-induced) hypertension without significant proteinuria, unspecified trimester: O13.9

## 2014-08-30 LAB — LIPASE, BLOOD: LIPASE: 1091 U/L — AB (ref 11–59)

## 2014-08-30 LAB — COMPREHENSIVE METABOLIC PANEL
ALT: 62 U/L — AB (ref 0–35)
AST: 158 U/L — AB (ref 0–37)
Albumin: 3 g/dL — ABNORMAL LOW (ref 3.5–5.2)
Alkaline Phosphatase: 146 U/L — ABNORMAL HIGH (ref 39–117)
Anion gap: 5 (ref 5–15)
BILIRUBIN TOTAL: 0.8 mg/dL (ref 0.3–1.2)
BUN: 6 mg/dL (ref 6–23)
CHLORIDE: 108 meq/L (ref 96–112)
CO2: 24 mmol/L (ref 19–32)
CREATININE: 0.33 mg/dL — AB (ref 0.50–1.10)
Calcium: 8.4 mg/dL (ref 8.4–10.5)
GLUCOSE: 77 mg/dL (ref 70–99)
POTASSIUM: 3.8 mmol/L (ref 3.5–5.1)
SODIUM: 137 mmol/L (ref 135–145)
TOTAL PROTEIN: 6.1 g/dL (ref 6.0–8.3)

## 2014-08-30 LAB — URINALYSIS, ROUTINE W REFLEX MICROSCOPIC
BILIRUBIN URINE: NEGATIVE
Glucose, UA: NEGATIVE mg/dL
Hgb urine dipstick: NEGATIVE
Ketones, ur: NEGATIVE mg/dL
NITRITE: NEGATIVE
PROTEIN: NEGATIVE mg/dL
SPECIFIC GRAVITY, URINE: 1.02 (ref 1.005–1.030)
Urobilinogen, UA: 1 mg/dL (ref 0.0–1.0)
pH: 7.5 (ref 5.0–8.0)

## 2014-08-30 LAB — CBC
HEMATOCRIT: 26.5 % — AB (ref 36.0–46.0)
HEMOGLOBIN: 8.7 g/dL — AB (ref 12.0–15.0)
MCH: 29 pg (ref 26.0–34.0)
MCHC: 32.8 g/dL (ref 30.0–36.0)
MCV: 88.3 fL (ref 78.0–100.0)
Platelets: 287 10*3/uL (ref 150–400)
RBC: 3 MIL/uL — AB (ref 3.87–5.11)
RDW: 13.2 % (ref 11.5–15.5)
WBC: 14.9 10*3/uL — ABNORMAL HIGH (ref 4.0–10.5)

## 2014-08-30 LAB — POCT FERN TEST: POCT Fern Test: NEGATIVE

## 2014-08-30 LAB — URINE MICROSCOPIC-ADD ON

## 2014-08-30 LAB — AMYLASE: Amylase: 558 U/L — ABNORMAL HIGH (ref 0–105)

## 2014-08-30 LAB — TYPE AND SCREEN
ABO/RH(D): O POS
ANTIBODY SCREEN: NEGATIVE

## 2014-08-30 MED ORDER — HYDROMORPHONE 0.3 MG/ML IV SOLN
INTRAVENOUS | Status: DC
  Administered 2014-08-30: 1.19 mg via INTRAVENOUS
  Administered 2014-08-30: 2.39 mg via INTRAVENOUS
  Administered 2014-08-30: 15:00:00 via INTRAVENOUS
  Administered 2014-08-31: 1.39 mg via INTRAVENOUS
  Administered 2014-08-31: 09:00:00 via INTRAVENOUS
  Administered 2014-08-31: 1.19 mg via INTRAVENOUS
  Administered 2014-08-31: 0.2 mL via INTRAVENOUS
  Administered 2014-08-31: 3.59 mg via INTRAVENOUS
  Administered 2014-09-01: 2.39 mg via INTRAVENOUS
  Administered 2014-09-01: 15:00:00 via INTRAVENOUS
  Administered 2014-09-01: 2.39 mg via INTRAVENOUS
  Administered 2014-09-01: 2.19 mg via INTRAVENOUS
  Administered 2014-09-01: 2.99 mg via INTRAVENOUS
  Filled 2014-08-30 (×4): qty 25

## 2014-08-30 MED ORDER — LACTATED RINGERS IV BOLUS (SEPSIS)
1000.0000 mL | Freq: Once | INTRAVENOUS | Status: AC
  Administered 2014-08-30: 1000 mL via INTRAVENOUS

## 2014-08-30 MED ORDER — ACETAMINOPHEN 325 MG PO TABS: 650.0000 mg | ORAL_TABLET | ORAL | Status: DC | PRN

## 2014-08-30 MED ORDER — PANTOPRAZOLE SODIUM 40 MG IV SOLR
40.0000 mg | INTRAVENOUS | Status: DC
  Administered 2014-08-30 – 2014-09-01 (×2): 40 mg via INTRAVENOUS
  Filled 2014-08-30 (×3): qty 40

## 2014-08-30 MED ORDER — HYDROMORPHONE HCL 1 MG/ML IJ SOLN
1.0000 mg | Freq: Once | INTRAMUSCULAR | Status: AC
  Administered 2014-08-30: 1 mg via INTRAVENOUS
  Filled 2014-08-30: qty 1

## 2014-08-30 MED ORDER — SODIUM CHLORIDE 0.9 % IJ SOLN: 9.0000 mL | INTRAMUSCULAR | Status: DC | PRN

## 2014-08-30 MED ORDER — NALOXONE HCL 0.4 MG/ML IJ SOLN: 0.4000 mg | INTRAMUSCULAR | Status: DC | PRN

## 2014-08-30 MED ORDER — DIPHENHYDRAMINE HCL 50 MG/ML IJ SOLN: 12.5000 mg | Freq: Four times a day (QID) | INTRAMUSCULAR | Status: DC | PRN

## 2014-08-30 MED ORDER — PRENATAL MULTIVITAMIN CH
1.0000 | ORAL_TABLET | Freq: Every day | ORAL | Status: DC
  Administered 2014-09-01 – 2014-09-17 (×17): 1 via ORAL
  Filled 2014-08-30 (×17): qty 1

## 2014-08-30 MED ORDER — CALCIUM CARBONATE ANTACID 500 MG PO CHEW: 2.0000 | CHEWABLE_TABLET | ORAL | Status: DC | PRN

## 2014-08-30 MED ORDER — ONDANSETRON HCL 4 MG/2ML IJ SOLN
4.0000 mg | Freq: Four times a day (QID) | INTRAMUSCULAR | Status: DC | PRN
Start: 2014-08-30 — End: 2014-09-01

## 2014-08-30 MED ORDER — DOCUSATE SODIUM 100 MG PO CAPS
100.0000 mg | ORAL_CAPSULE | Freq: Every day | ORAL | Status: DC
  Administered 2014-08-30 – 2014-09-17 (×18): 100 mg via ORAL
  Filled 2014-08-30 (×18): qty 1

## 2014-08-30 MED ORDER — LACTATED RINGERS IV SOLN
INTRAVENOUS | Status: DC
  Administered 2014-08-30 – 2014-09-01 (×6): via INTRAVENOUS

## 2014-08-30 MED ORDER — ZOLPIDEM TARTRATE 5 MG PO TABS
5.0000 mg | ORAL_TABLET | Freq: Every evening | ORAL | Status: DC | PRN
  Administered 2014-09-01 – 2014-09-17 (×17): 5 mg via ORAL
  Filled 2014-08-30 (×17): qty 1

## 2014-08-30 MED ORDER — DIPHENHYDRAMINE HCL 12.5 MG/5ML PO ELIX
12.5000 mg | ORAL_SOLUTION | Freq: Four times a day (QID) | ORAL | Status: DC | PRN
  Filled 2014-08-30: qty 5

## 2014-08-30 NOTE — MAU Note (Signed)
?   decel baby on right vs mat pulse, o2 sat applied- picked up where baby's heart rate had been, had repositioned baby US placement, fh in 130

## 2014-08-30 NOTE — Plan of Care (Signed)
Problem: Consults Goal: Birthing Suites Patient Information Press F2 to bring up selections list   Pt < [redacted] weeks EGA     

## 2014-08-30 NOTE — MAU Provider Note (Signed)
History     CSN: [id number]  Arrival date & time 08/30/14  0820   First Provider Initiated Contact with Patient 08/30/14 [phone]      Chief Complaint  Patient presents with  . Abdominal Pain  . Contractions    HPI 29 y.o. [id number]  with twin gestation presents to the MAU with upper abdominal pain and back pain radiating from shoulders to waist. Pt has a history of pancreatitis and was diagnosed on 08/08/14. Denies LOF, vaginal bleeding, contractions, Reports positive fetal movement.   Past Medical History  Diagnosis Date  . Asthma   . Headache(784.0)     migraines  . Depression   . PIH (pregnancy induced hypertension)   . Late prenatal care     22wks  . Anemia 2007  . HPV (human papilloma virus) anogenital infection   . Recurrent UTI   . Abnormal Pap smear   . Chlamydia infection     has no taken treatment as of 01/03/2013  . SVD (spontaneous vaginal delivery) 01/08/2013  . Vaginal Pap smear, abnormal   . Twin pregnancy in third trimester 08/08/2014  . Pancreatitis, acute 08/08/2014    Past Surgical History  Procedure Laterality Date  . No past surgeries      Family History  Problem Relation Age of Onset  . Anesthesia problems Neg Hx   . Hypotension Neg Hx   . Malignant hyperthermia Neg Hx   . Pseudochol deficiency Neg Hx   . Heart disease Maternal Grandmother     clots in heart  . Stroke Maternal Grandmother   . Diabetes Mother   . Heart disease Maternal Grandfather     pacemaker  . Miscarriages / Stillbirths Paternal Aunt   . Stroke Paternal Grandmother     History  Substance Use Topics  . Smoking status: Current Every Day Smoker -- 0.25 packs/day for 5 years    Types: Cigarettes  . Smokeless tobacco: Never Used  . Alcohol Use: No    OB History    Gravida Para Term Preterm AB TAB SAB Ectopic Multiple Living   0 2 0 2 0 0 3      Review of Systems  Constitutional: Negative.   HENT: Negative.   Eyes: Negative.   Respiratory: Negative.    Cardiovascular: Negative.   Gastrointestinal:       Upper abdominal pain  Endocrine: Negative.   Genitourinary: Negative.   Allergic/Immunologic: Negative.   Neurological: Negative.   Hematological: Negative.   Psychiatric/Behavioral: Negative.     Allergies  Corn syrup  Home Medications  No current outpatient prescriptions on file.  BP 112/57 mmHg  Pulse 64  Temp(Src) 97.5 F (36.4 C) (Axillary)  Resp 22  SpO2 99%  LMP 01/14/2014  Physical Exam  Constitutional: She is oriented to person, place, and time. She appears well-developed and well-nourished.  HENT:  Head: Normocephalic and atraumatic.  Neck: Normal range of motion. Neck supple.  Abdominal: Soft. There is tenderness.  Upper abdominal tenderness  Musculoskeletal: Normal range of motion.  Neurological: She is alert and oriented to person, place, and time. She has normal reflexes.  Skin: Skin is warm and dry.  Psychiatric: She has a normal mood and affect.   FHR Baby A baseline 145, moderate variability, positive accels, no decels FHR Baby B baseline 145, moderate variability, positive accels, no decels No contractions on toco or to palpation  MAU Course  Procedures (including critical care time)  Labs Reviewed  URINALYSIS, ROUTINE  W REFLEX MICROSCOPIC - Abnormal; Notable for the following:    APPearance CLOUDY (*)    Leukocytes, UA SMALL (*)    All other components within normal limits  URINE MICROSCOPIC-ADD ON - Abnormal; Notable for the following:    Squamous Epithelial / LPF FEW (*)    Bacteria, UA FEW (*)    All other components within normal limits  CBC - Abnormal; Notable for the following:    WBC 14.9 (*)    RBC 3.00 (*)    Hemoglobin 8.7 (*)    HCT 26.5 (*)    All other components within normal limits  COMPREHENSIVE METABOLIC PANEL - Abnormal; Notable for the following:    Creatinine, Ser 0.33 (*)    Albumin 3.0 (*)    AST 158 (*)    ALT 62 (*)    Alkaline Phosphatase 146 (*)    All  other components within normal limits  AMYLASE - Abnormal; Notable for the following:    Amylase 558 (*)    All other components within normal limits  LIPASE, BLOOD   [place] Abdomen Complete  08/30/2014   CLINICAL DATA:  Initial encounter for right upper quadrant pain.  EXAM: ULTRASOUND ABDOMEN COMPLETE  COMPARISON:  08/08/2014  FINDINGS: Gallbladder: Multiple small stones are evident in the gallbladder. No gallbladder wall thickening or pericholecystic fluid. The sonographer reports no sonographic Murphy sign.  Common bile duct: Diameter: 4 mm.  Nondilated.  Liver: No focal lesion identified. Within normal limits in parenchymal echogenicity.  IVC: No abnormality visualized.  Pancreas: Limited visualization secondary to overlying bowel gas.  Spleen: Size and appearance within normal limits.  Right Kidney: Length: 13.1 cm. Echogenicity within normal limits. No mass or hydronephrosis visualized.  Left Kidney: Length: 13.4 cm. Echogenicity within normal limits. No mass or hydronephrosis visualized.  Abdominal aorta: No aneurysm visualized.  Other findings: None.  IMPRESSION: Cholelithiasis without sonographic features of cholecystitis.   Electronically Signed   By: Kennith CenterEric  Mansell M.D.   On: 08/30/2014 12:48   [place] Abdomen Complete  08/30/2014   CLINICAL DATA:  Initial encounter for right upper quadrant pain.  EXAM: ULTRASOUND ABDOMEN COMPLETE  COMPARISON:  08/08/2014  FINDINGS: Gallbladder: Multiple small stones are evident in the gallbladder. No gallbladder wall thickening or pericholecystic fluid. The sonographer reports no sonographic Murphy sign.  Common bile duct: Diameter: 4 mm.  Nondilated.  Liver: No focal lesion identified. Within normal limits in parenchymal echogenicity.  IVC: No abnormality visualized.  Pancreas: Limited visualization secondary to overlying bowel gas.  Spleen: Size and appearance within normal limits.  Right Kidney: Length: 13.1 cm. Echogenicity within normal limits. No mass or  hydronephrosis visualized.  Left Kidney: Length: 13.4 cm. Echogenicity within normal limits. No mass or hydronephrosis visualized.  Abdominal aorta: No aneurysm visualized.  Other findings: None.  IMPRESSION: Cholelithiasis without sonographic features of cholecystitis.   Electronically Signed   By: Kennith CenterEric  Mansell M.D.   On: 08/30/2014 12:48    Lipase pending Cervical exam- 1-2/50/-3 MDM  IVF; Pain Meds Ultrasound Pending labs    Assessment:  1. Twin pregnancy in third trimester   2. Acute biliary pancreatitis   3. Colicky RUQ abdominal pain       Plan: Spec Exam done No pooling Fern negative Consult Dr Ellyn HackBovard Admit for pain management Dilaudid PCA NPO  Sharen CounterLisa Leftwich-Kirby Certified Nurse-Midwife

## 2014-08-30 NOTE — H&P (Signed)
[name] is a 29 y.o. female  [phone] at 31+6 with twin IUP and repeat gallstone pancreatitis, admitted about 2 weeks ago with same.  Today Amylase, Lipase, LFTs are elevated.    Admitted with pain, nausea and vomiting.  Pregnancy is twin IUP, baby A with 2VC, otherwise WNL.  Also struggles with HA.  +FM, no LOF, no VB, occ ctx.  ? ROM in MAU, fern negative.  Chl + - with negative TOC, abnormal pap ASCUS HR HPV.  Twins are di/di.    Maternal Medical History:  Reason for admission: Nausea.   Fetal activity: Perceived fetal activity is normal.    Prenatal Complications - Diabetes: none.    OB History    Gravida Para Term Preterm AB TAB SAB Ectopic Multiple Living   0 2 0 2 0 0 3    SAB x 2, SVD x 3 Last pap ASCUS HR HPV, colpo in preg STD is HPV  Past Medical History  Diagnosis Date  . Asthma   . Headache(784.0)     migraines  . Depression   . PIH (pregnancy induced hypertension)   . Late prenatal care     22wks  . Anemia 2007  . HPV (human papilloma virus) anogenital infection   . Recurrent UTI   . Abnormal Pap smear   . Chlamydia infection     has no taken treatment as of 01/03/2013  . SVD (spontaneous vaginal delivery) 01/08/2013  . Vaginal Pap smear, abnormal   . Twin pregnancy in third trimester 08/08/2014  . Pancreatitis, acute 08/08/2014  . Pregnancy induced hypertension   . Acute gallstone pancreatitis 08/30/2014   Past Surgical History  Procedure Laterality Date  . No past surgeries     Family History: family history includes Diabetes in her mother; Heart disease in her maternal grandfather and maternal grandmother; Miscarriages / [place] in her paternal aunt; Stroke in her maternal grandmother and paternal grandmother. There is no history of Anesthesia problems, Hypotension, Malignant hyperthermia, or Pseudochol deficiency. Social History:  reports that she has been smoking Cigarettes.  She has a 1.25 pack-year smoking history. She has never used  smokeless tobacco. She reports that she does not drink alcohol or use illicit drugs.single Meds buspirone, iron, butalbital, percocet, PNV, phenergan, Zoloft, Zofran All food (corn syrup, hazel nuts, dark choc) and seasonal   Prenatal Transfer Tool  Maternal Diabetes: No Genetic Screening: Normal Maternal Ultrasounds/Referrals: Abnormal:  Findings:   Other:2 vessel cord, twin A Fetal Ultrasounds or other Referrals:  None Maternal Substance Abuse:  No Significant Maternal Medications:  Meds include: Other: percocet Significant Maternal Lab Results:  None Other Comments:  h/o gallstone pancreatitis  Review of Systems  Constitutional: Negative.   Eyes: Negative.   Respiratory: Negative.   Cardiovascular: Negative.   Gastrointestinal: Positive for heartburn, nausea and vomiting.  Genitourinary: Negative.   Musculoskeletal: Negative.   Skin: Negative.   Neurological: Positive for headaches.  Psychiatric/Behavioral: Negative.     Dilation: 1.5 Effacement (%): 50 Station: -3 Exam by:: Clemmons CNM Blood pressure 110/66, pulse 83, temperature 98 F (36.7 C), temperature source Oral, resp. rate 18, height 5' 7.48" (1.714 m), weight 90.22 kg (198 lb 14.4 oz), last menstrual period 01/14/2014, SpO2 100 %. Maternal Exam:  Abdomen: Patient reports no abdominal tenderness. Fundal height is appropriate for gestation.    Introitus: Normal vulva. Normal vagina.    Physical Exam  Constitutional: She is oriented to person, place, and time. She appears well-developed  and well-nourished.  HENT:  Head: Normocephalic and atraumatic.  Cardiovascular: Normal rate and regular rhythm.   Respiratory: Effort normal and breath sounds normal. No respiratory distress. She has no wheezes.  GI: Soft. Bowel sounds are normal. She exhibits no distension. There is no tenderness.  Musculoskeletal: Normal range of motion.  Neurological: She is alert and oriented to person, place, and time.  Skin: Skin is  warm and dry.  Psychiatric: She has a normal mood and affect. Her behavior is normal.    Prenatal labs: ABO, Rh: O/Positive/-- (09/04 0000) Antibody: Negative (09/04 0000) Rubella: Immune (09/04 0000) RPR: Nonreactive (09/04 0000)  HBsAg: Negative (09/04 0000)  HIV: Non-reactive (09/04 0000)  GBS:   unknown  Hgb 10.1/Ur Cx neg/ Chl +, TOC neg/ GC neg/Pap ASCUS HR HPV +, AFP WNL/glucola 123  A on R, female, nl anat, ant plac, vtx, 2VC; B nl limited anat, ant plac, breech; 11% discordant  Desires BTL, signed papers 12/15  Assessment/Plan: 31+6 with gallstone pancreatitis D/w Arlyce DiceKaplan - will assess for stent - GI [place] Admit - IVF, PCA, NPO   Bovard-Stuckert, Blayden Conwell 08/30/2014, 5:51 PM

## 2014-08-30 NOTE — MAU Note (Signed)
Pain started last night in upper abd/epigastric area- pt has pancreatitis.  Started contracting around 0600 this morning. No leaking, some spotting last night.

## 2014-08-30 NOTE — MAU Note (Signed)
Pt reports ? Loss of fluid, + wetness with last ctx. Yellow wetness noted on bed pad, fern slide checked. (was neg)  CNM notified

## 2014-08-31 DIAGNOSIS — K851 Biliary acute pancreatitis: Secondary | ICD-10-CM

## 2014-08-31 LAB — COMPREHENSIVE METABOLIC PANEL
ALBUMIN: 2.4 g/dL — AB (ref 3.5–5.2)
ALT: 89 U/L — ABNORMAL HIGH (ref 0–35)
AST: 97 U/L — ABNORMAL HIGH (ref 0–37)
Alkaline Phosphatase: 142 U/L — ABNORMAL HIGH (ref 39–117)
Anion gap: 4 — ABNORMAL LOW (ref 5–15)
BILIRUBIN TOTAL: 0.6 mg/dL (ref 0.3–1.2)
BUN: 7 mg/dL (ref 6–23)
CO2: 22 mmol/L (ref 19–32)
CREATININE: 0.33 mg/dL — AB (ref 0.50–1.10)
Calcium: 8 mg/dL — ABNORMAL LOW (ref 8.4–10.5)
Chloride: 108 mEq/L (ref 96–112)
GFR calc non Af Amer: >90 mL/min (ref >90)
Glucose, Bld: 71 mg/dL (ref 70–99)
POTASSIUM: 3.6 mmol/L (ref 3.5–5.1)
Sodium: 134 mmol/L — ABNORMAL LOW (ref 135–145)
Total Protein: 5.5 g/dL — ABNORMAL LOW (ref 6.0–8.3)

## 2014-08-31 LAB — AMNISURE RUPTURE OF MEMBRANE (ROM) NOT AT ARMC: Amnisure ROM: NEGATIVE

## 2014-08-31 LAB — LIPASE, BLOOD: Lipase: 30 U/L (ref 11–59)

## 2014-08-31 LAB — AMYLASE: Amylase: 83 U/L (ref 0–105)

## 2014-08-31 MED ORDER — OXYCODONE-ACETAMINOPHEN 5-325 MG PO TABS: 1.0000 | ORAL_TABLET | Freq: Once | ORAL | Status: DC

## 2014-08-31 MED ORDER — BETAMETHASONE SOD PHOS & ACET 6 (3-3) MG/ML IJ SUSP
12.0000 mg | INTRAMUSCULAR | Status: AC
  Administered 2014-08-31 – 2014-09-01 (×2): 12 mg via INTRAMUSCULAR
  Filled 2014-08-31 (×2): qty 2

## 2014-08-31 MED ORDER — AZITHROMYCIN 250 MG PO TABS
500.0000 mg | ORAL_TABLET | Freq: Every day | ORAL | Status: AC
  Administered 2014-09-01 – 2014-09-06 (×6): 500 mg via ORAL
  Filled 2014-08-31 (×6): qty 2

## 2014-08-31 MED ORDER — MAGNESIUM SULFATE 40 G IN LACTATED RINGERS - SIMPLE
2.0000 g/h | INTRAVENOUS | Status: DC
  Administered 2014-08-31: 2 g/h via INTRAVENOUS
  Filled 2014-08-31: qty 500

## 2014-08-31 MED ORDER — AZITHROMYCIN 250 MG PO TABS: 500.0000 mg | ORAL_TABLET | Freq: Every day | ORAL | Status: DC

## 2014-08-31 MED ORDER — AMOXICILLIN 500 MG PO CAPS
500.0000 mg | ORAL_CAPSULE | Freq: Three times a day (TID) | ORAL | Status: AC
  Administered 2014-09-02 – 2014-09-07 (×15): 500 mg via ORAL
  Filled 2014-08-31 (×15): qty 1

## 2014-08-31 MED ORDER — SODIUM CHLORIDE 0.9 % IV SOLN
2.0000 g | Freq: Four times a day (QID) | INTRAVENOUS | Status: AC
  Administered 2014-08-31 – 2014-09-02 (×8): 2 g via INTRAVENOUS
  Filled 2014-08-31 (×8): qty 2000

## 2014-08-31 MED ORDER — MAGNESIUM SULFATE BOLUS VIA INFUSION
4.0000 g | Freq: Once | INTRAVENOUS | Status: AC
  Administered 2014-08-31: 4 g via INTRAVENOUS
  Filled 2014-08-31: qty 500

## 2014-08-31 MED ORDER — AMOXICILLIN 250 MG PO CAPS: 250.0000 mg | ORAL_CAPSULE | Freq: Four times a day (QID) | ORAL | Status: DC

## 2014-08-31 MED ORDER — AZITHROMYCIN 250 MG PO TABS
1000.0000 mg | ORAL_TABLET | Freq: Once | ORAL | Status: DC
  Administered 2014-08-31: 1000 mg via ORAL
  Filled 2014-08-31: qty 4

## 2014-08-31 NOTE — Consult Note (Signed)
Consultation  Referring Provider: Sherian Rein MD - OB  Primary Care Physician:  No PCP Per Patient Primary Gastroenterologist:  Dr.Kaplan  Reason for Consultation:  Recurrent pancreatitis  HPI: [name] is a 29 y.o. female admitted yesterday with recurrent abdominal pain nausea and vomiting secondary to recurrent pancreatitis. Patient was initially seen in consultation by Dr. Arlyce Dice for GI around Christmas time when she was also hospitalized which what was felt to be an episode of mild acute gallstone pancreatitis. At that time she was [redacted] weeks pregnant with twins Fortunately she had a mild episode. Ultrasound did show multiple gallstones largest 5 mm in the common bile duct of 3.2 mm.  Patient now [redacted] weeks pregnant with acute onset of recurrent pain, nausea and vomiting ,very similar to her previous episode. Abdominal ultrasound done yesterday shows multiple gallstones no gallbladder wall thickening or pericholecystic fluid and a common bile duct of 4 mm. Pancreas is not well seen.  Lipase was 1081 on admission and is down to 30 today amylase normal today LFTs did bump up total bilirubin within normal range and LFTs are already trending down. WBC is 14.9 hemoglobin 8.7.  Pt says she had been feeling well in between these episodes. She feels much better today than on admit yesterday, ans says this episode seems to be resolving  much faster than the first one. She denies N/V today and is hungry and asking for food She has been having some contractions over the past week or so, and is 2cm dilated. Says she is expected to deliver in the next 2 weeks.   Past Medical History  Diagnosis Date  . Asthma   . Headache(784.0)     migraines  . Depression   . PIH (pregnancy induced hypertension)   . Late prenatal care     22wks  . Anemia 2007  . HPV (human papilloma virus) anogenital infection   . Recurrent UTI   . Abnormal Pap smear   . Chlamydia infection     has no taken  treatment as of 01/03/2013  . SVD (spontaneous vaginal delivery) 01/08/2013  . Vaginal Pap smear, abnormal   . Twin pregnancy in third trimester 08/08/2014  . Pancreatitis, acute 08/08/2014  . Pregnancy induced hypertension   . Acute gallstone pancreatitis 08/30/2014    Past Surgical History  Procedure Laterality Date  . No past surgeries      Prior to Admission medications   Medication Sig Start Date End Date Taking? Authorizing Provider  acetaminophen (TYLENOL) 500 MG tablet Take 1,000 mg by mouth every 6 (six) hours as needed for mild pain or headache.   Yes Historical Provider, MD  busPIRone (BUSPAR) 5 MG tablet Take 5 mg by mouth 3 (three) times daily.   Yes Historical Provider, MD  chlorhexidine (PERIDEX) 0.12 % solution Swish and spit 10 mLs every morning.  04/24/14  Yes Historical Provider, MD  diphenhydrAMINE (BENADRYL) 25 MG tablet Take 25 mg by mouth every 6 (six) hours as needed for itching or allergies (allergies).    Yes Historical Provider, MD  ferrous sulfate 325 (65 FE) MG tablet Take 1 tablet (325 mg total) by mouth daily with breakfast. Patient taking differently: Take 325 mg by mouth 3 (three) times daily with meals.  08/10/14  Yes Oliver Pila, MD  ondansetron (ZOFRAN) 8 MG tablet Take 8 mg by mouth every 8 (eight) hours as needed for nausea or vomiting.   Yes Historical Provider, MD  Prenatal Vit-Fe Fumarate-FA (  PRENATAL MULTIVITAMIN) TABS tablet Take 1 tablet by mouth 2 (two) times daily.   Yes Historical Provider, MD  promethazine (PHENERGAN) 12.5 MG tablet Take 12.5 mg by mouth 2 (two) times daily as needed for nausea or vomiting (nausea).    Yes Historical Provider, MD  sertraline (ZOLOFT) 25 MG tablet Take 25 mg by mouth daily.   Yes Historical Provider, MD  albuterol (PROVENTIL HFA;VENTOLIN HFA) 108 (90 BASE) MCG/ACT inhaler Inhale 2 puffs into the lungs every 6 (six) hours as needed for wheezing or shortness of breath.    Historical Provider, MD    oxyCODONE-acetaminophen (PERCOCET/ROXICET) 5-325 MG per tablet Take 1-2 tablets by mouth every 6 (six) hours as needed for moderate pain. Patient not taking: Reported on 08/30/2014 08/10/14   Oliver Pila, MD    Current Facility-Administered Medications  Medication Dose Route Frequency Provider Last Rate Last Dose  . acetaminophen (TYLENOL) tablet 650 mg  650 mg Oral Q4H PRN Sherian Rein, MD      . calcium carbonate (TUMS - dosed in mg elemental calcium) chewable tablet 400 mg of elemental calcium  2 tablet Oral Q4H PRN Jody Bovard-Stuckert, MD      . diphenhydrAMINE (BENADRYL) injection 12.5 mg  12.5 mg Intravenous Q6H PRN Lisa A Leftwich-Kirby, CNM       Or  . diphenhydrAMINE (BENADRYL) 12.5 MG/5ML elixir 12.5 mg  12.5 mg Oral Q6H PRN Hurshel Party, CNM      . docusate sodium (COLACE) capsule 100 mg  100 mg Oral Daily Sherian Rein, MD   100 mg at 08/30/14 1920  . HYDROmorphone (DILAUDID) PCA injection 0.3 mg/mL   Intravenous 6 times per day Hurshel Party, CNM      . lactated ringers infusion   Intravenous Continuous Sherian Rein, MD 200 mL/hr at 08/31/14 0859    . naloxone [facility]) injection 0.4 mg  0.4 mg Intravenous PRN Lisa A Leftwich-Kirby, CNM       And  . sodium chloride 0.9 % injection 9 mL  9 mL Intravenous PRN Hurshel Party, CNM      . ondansetron (ZOFRAN) injection 4 mg  4 mg Intravenous Q6H PRN Hurshel Party, CNM      . pantoprazole (PROTONIX) injection 40 mg  40 mg Intravenous Q24H Sherian Rein, MD   40 mg at 08/30/14 1920  . prenatal multivitamin tablet 1 tablet  1 tablet Oral Q1200 Jody Bovard-Stuckert, MD      . zolpidem (AMBIEN) tablet 5 mg  5 mg Oral QHS PRN Sherian Rein, MD        Allergies as of 08/25/2014 - Review Complete 08/25/2014  Allergen Reaction Noted  . Corn syrup Anaphylaxis and Hives 10/15/2011    Family History  Problem Relation Age of Onset  . Anesthesia problems Neg Hx   .  Hypotension Neg Hx   . Malignant hyperthermia Neg Hx   . Pseudochol deficiency Neg Hx   . Heart disease Maternal Grandmother     clots in heart  . Stroke Maternal Grandmother   . Diabetes Mother   . Heart disease Maternal Grandfather     pacemaker  . Miscarriages / Stillbirths Paternal Aunt   . Stroke Paternal Grandmother     History   Social History  . Marital Status: Single    Spouse Name: N/A    Number of Children: N/A  . Years of Education: N/A   Occupational History  . Not on file.   Social History Main Topics  .  Smoking status: Current Every Day Smoker -- 0.25 packs/day for 5 years    Types: Cigarettes  . Smokeless tobacco: Never Used  . Alcohol Use: No  . Drug Use: No  . Sexual Activity: Yes    Birth Control/ Protection: None     Comment: sex one week ago Dec 1   Other Topics Concern  . Not on file   Social History Narrative    Review of Systems: Pertinent positive and negative review of systems were noted in the above HPI section.  All other review of systems was otherwise negative.  Physical Exam: Vital signs in last 24 hours: Temp:  [97.9 F (36.6 C)-98.6 F (37 C)] 98.2 F (36.8 C) (01/16 0856) Pulse Rate:  [25-104] 81 (01/16 0856) Resp:  [16-20] 18 (01/16 0856) BP: (100-112)/(48-66) 103/49 mmHg (01/16 0856) SpO2:  [96 %-100 %] 97 % (01/16 0856) Weight:  [198 lb 14.4 oz (90.22 kg)] 198 lb 14.4 oz (90.22 kg) (01/15 1625)   General:   Alert,  Well-developed, well-nourished,young WF pleasant and cooperative in NAD Head:  Normocephalic and atraumatic. Eyes:  Sclera clear, no icterus.   Conjunctiva pink. Ears:  Normal auditory acuity. Nose:  No deformity, discharge,  or lesions. Mouth:  No deformity or lesions.   Neck:  Supple; no masses or thyromegaly. Lungs:  Clear throughout to auscultation.   No wheezes, crackles, or rhonchi. Heart:  Regular rate and rhythm; no murmurs, clicks, rubs,  or gallops. Abdomen:  Soft, Bs+ , basically nontender-  fundus C/W 32 week IUP Rectal:  Deferred  Msk:  Symmetrical without gross deformities. . Pulses:  Normal pulses noted. Extremities:  Without clubbing or edema. Neurologic:  Alert and  oriented x4;  grossly normal neurologically. Skin:  Intact without significant lesions or rashes.. Psych:  Alert and cooperative. Normal mood and affect.  Intake/Output from previous day:   Intake/Output this shift:    Lab Results:  Recent Labs  08/30/14 1040  WBC 14.9*  HGB 8.7*  HCT 26.5*  PLT 287   BMET  Recent Labs  08/30/14 1040 08/31/14 0527  NA 137 134*  K 3.8 3.6  CL 108 108  CO2 24 22  GLUCOSE 77 71  BUN 6 7  CREATININE 0.33* 0.33*  CALCIUM 8.4 8.0*   LFT  Recent Labs  08/31/14 0527  PROT 5.5*  ALBUMIN 2.4*  AST 97*  ALT 89*  ALKPHOS 142*  BILITOT 0.6   PT/INR No results for input(s): LABPROT, INR in the last 72 hours. Hepatitis Panel No results for input(s): HEPBSAG, HCVAB, HEPAIGM, HEPBIGM in the last 72 hours.    IMPRESSION:  #1  29 yo female [redacted] weeks pregnant with twins.  Three other children at home.  Currently 2 cm dilated and expected to deliver early #2 recurrent acute mild pancreatitis-resolving rapidly- suspect she is passing sludge #3 gallstones  PLAN: Fortunately this is a very mild episode of pancreatitis, and she is already feeling much better. No worrisome parameters. As she is expected to deliver early in the next 2 weeks , may be safest to continue to monitor - then plan lap chole /IOC shortly after delivery Could consider ERCP with stent , but with rapid resolution  Of current episode  this is not absolutelly needed. Will start clears- do not advance any further today Repeat labs in am Continue hydration.   Amy Esterwood  08/31/2014, 10:02 AM   GI Attending Note   Chart was reviewed and patient was examined. X-rays and  lab were reviewed.    I agree with management and plans.  We will defer any endoscopic intervention since it appears  that she may be delivering within the next 2 weeks.  This of course renders her susceptible for recurrent pancreatitis but that risk has to be weighed against endoscopic intervention.  Robert D. Arlyce DiceKaplan, M.D., [facility]tr[facility]AC[facility] Cell [phone]

## 2014-08-31 NOTE — Progress Notes (Signed)
Patient ID: Mackenzie CollardBrittany L [name], female   DOB: 01/25/1986, 29 y.o.   MRN: [id number]  Nurse called MD, after ROM r/o earlier today with negative amnisure, had ROM after going to restroom (fern +)  AFVSS gen NAD Abd NT  FHTs 120-130, good var, category 1, 140-150 category 1, good var toco  Spacing out, was Q 2-3, now q6+  SVE earlier in day 2/70/-2  Will give amp/zithro, BMZ D/w pt POC and management Vtx/xtx twins - d/w pt mode of delivery (plan for SVD, SVD) Magnesium for CP prophylaxis NICU consult - MD called

## 2014-08-31 NOTE — Progress Notes (Signed)
Patient ID: Mackenzie CollardBrittany L Kisner, female   DOB: 11/13/1985, 29 y.o.   MRN: [id number]  32wk, twin pregnancy, pancreatitis, GI consult  Feeling better, +FM, no LOF, no VB, no ctx  AFVSS, LFTs improving, Amylase, Lipase P  gen NAD Abd soft, NT  FHTs 120-130 good var, category 1; 120-130 good var, category 1 toco occ  Waiting GI consult NPO except ice, IVF

## 2014-09-01 LAB — COMPREHENSIVE METABOLIC PANEL
ALK PHOS: 177 U/L — AB (ref 39–117)
ALT: 74 U/L — AB (ref 0–35)
ANION GAP: 5 (ref 5–15)
AST: 60 U/L — ABNORMAL HIGH (ref 0–37)
Albumin: 2.8 g/dL — ABNORMAL LOW (ref 3.5–5.2)
BILIRUBIN TOTAL: 0.2 mg/dL — AB (ref 0.3–1.2)
BUN: <5 mg/dL — ABNORMAL LOW (ref 6–23)
CHLORIDE: 109 meq/L (ref 96–112)
CO2: 20 mmol/L (ref 19–32)
CREATININE: 0.37 mg/dL — AB (ref 0.50–1.10)
Calcium: 7.7 mg/dL — ABNORMAL LOW (ref 8.4–10.5)
GFR calc Af Amer: >90 mL/min (ref >90)
GFR calc non Af Amer: >90 mL/min (ref >90)
Glucose, Bld: 153 mg/dL — ABNORMAL HIGH (ref 70–99)
Potassium: 4.2 mmol/L (ref 3.5–5.1)
Sodium: 134 mmol/L — ABNORMAL LOW (ref 135–145)
TOTAL PROTEIN: 6.5 g/dL (ref 6.0–8.3)

## 2014-09-01 LAB — AMYLASE: Amylase: 55 U/L (ref 0–105)

## 2014-09-01 LAB — LIPASE, BLOOD: Lipase: 27 U/L (ref 11–59)

## 2014-09-01 LAB — CBC
HEMATOCRIT: 25.9 % — AB (ref 36.0–46.0)
HEMOGLOBIN: 8.5 g/dL — AB (ref 12.0–15.0)
MCH: 29 pg (ref 26.0–34.0)
MCHC: 32.8 g/dL (ref 30.0–36.0)
MCV: 88.4 fL (ref 78.0–100.0)
Platelets: 233 10*3/uL (ref 150–400)
RBC: 2.93 MIL/uL — ABNORMAL LOW (ref 3.87–5.11)
RDW: 13.4 % (ref 11.5–15.5)
WBC: 8.4 10*3/uL (ref 4.0–10.5)

## 2014-09-01 MED ORDER — CYCLOBENZAPRINE HCL 10 MG PO TABS
5.0000 mg | ORAL_TABLET | Freq: Three times a day (TID) | ORAL | Status: DC | PRN
  Administered 2014-09-01 – 2014-09-17 (×7): 5 mg via ORAL
  Filled 2014-09-01 (×7): qty 1

## 2014-09-01 MED ORDER — PANTOPRAZOLE SODIUM 40 MG PO TBEC
40.0000 mg | DELAYED_RELEASE_TABLET | Freq: Every day | ORAL | Status: DC
  Administered 2014-09-01 – 2014-09-17 (×16): 40 mg via ORAL
  Filled 2014-09-01 (×16): qty 1

## 2014-09-01 MED ORDER — OXYCODONE-ACETAMINOPHEN 5-325 MG PO TABS
1.0000 | ORAL_TABLET | Freq: Four times a day (QID) | ORAL | Status: DC | PRN
  Administered 2014-09-01: 2 via ORAL
  Filled 2014-09-01: qty 2

## 2014-09-01 NOTE — Progress Notes (Signed)
Continue to adjust monitors. °

## 2014-09-01 NOTE — Progress Notes (Signed)
Patient ID: [name], female   DOB: 02/22/1986, 29 y.o.   MRN: [id number]  [facility] Progress Note  Subjective: Ruptured membrane last night, large gush of fluid about an hour ao , and having more frequent contractions this am Hungry, pain from pancreas about a "2" , no nausea  Objective:  Vital signs in last 24 hours: Temp:  [97.6 F (36.4 C)-98.5 F (36.9 C)] 97.6 F (36.4 C) (01/17 1034) Pulse Rate:  [25-98] 96 (01/17 0900) Resp:  [16-20] 16 (01/17 0900) BP: (101-126)/(51-94) 126/85 mmHg (01/17 0900) SpO2:  [79 %-99 %] 98 % (01/17 0606)   General:   Alert,  Well-developed, young Wf   in NAD- uncomfortable-having contractions Heart:  Regular rate and rhythm; no murmurs Pulm;clear Abdomen:  Soft, nontender having contractions   Extremities:  Without edema. Neurologic:  Alert and  oriented x4;  grossly normal neurologically. Psych:  Alert and cooperative. Normal mood and affect.  Intake/Output from previous day: 01/16 0701 - 01/17 0700 In: 8537.1 [P.O.:1680; I.V.:6757.1; IV Piggyback:100] Out: 5909 [Urine:4850] Intake/Output this shift: Total I/O In: 680 [P.O.:230; I.V.:450] Out: 3112 [Urine:1375]  Lab Results:  Lipase 27  Recent Labs  08/30/14 1040 09/01/14 0529  WBC 14.9* 8.4  HGB 8.7* 8.5*  HCT 26.5* 25.9*  PLT 287 233   BMET  Recent Labs  08/30/14 1040 08/31/14 0527 09/01/14 0529  NA 137 134* 134*  K 3.8 3.6 4.2  CL 108 108 109  CO2 '24 22 20  ' GLUCOSE 77 71 153*  BUN 6 7 <5*  CREATININE 0.33* 0.33* 0.37*  CALCIUM 8.4 8.0* 7.7*   LFT  Hepatic Function Latest Ref Rng 09/01/2014 08/31/2014 08/30/2014  Total Protein 6.0 - 8.3 g/dL 6.5 5.5(L) 6.1  Albumin 3.5 - 5.2 g/dL 2.8(L) 2.4(L) 3.0(L)  AST 0 - 37 U/L 60(H) 97(H) 158(H)  ALT 0 - 35 U/L 74(H) 89(H) 62(H)  Alk Phosphatase 39 - 117 U/L 177(H) 142(H) 146(H)  Total Bilirubin 0.3 - 1.2 mg/dL 0.2(L) 0.6 0.8  Bilirubin, Direct 0.0 - 0.3 mg/dL - - -       Assessment / Plan: #1  29 yo female with recurrent mild gallstone pancreatitis  --rapidly resolving sxs #2 32 week pregnancy with twins- PROM  1/16, on MG but having frequent contractions today  Plan;  No GI intervention planned She needs to see surgery as soon as possible after delivery for lap chole and IOC to prevent further more severe episodes in the near future Advance to low fat diet  Principal Problem:   Acute gallstone pancreatitis Active Problems:   Pancreatitis, acute     LOS: 2 days   Amy Esterwood  09/01/2014, 12:16 PM   GI Attending Note  I have personally taken an interval history, reviewed the chart, and examined the patient.  I agree with the extender's note, impression and recommendations.  Sandy Salaam. Deatra Ina, MD, [facility] [phone]

## 2014-09-01 NOTE — Progress Notes (Signed)
Patient ID: [name], female   DOB: 06/15/1986, 29 y.o.   MRN: [id number]  32+ PPROM/pancreatitis s/p BMZ x 1, on Amp/Zithro; labs improving, feeling better, on magnesium  No c/o's.  +FM, cont LOF, no VB, occ ctx  AFVSS gen NAD Abd soft, FNT  FHTs 110-120's good var, x 2, category 1 toco irr  Continue current mgmt D/w pt mode of delivery, plan for at 34 wk now that ruptured Appreciate GI input - will wait to advance until they advise, will convert to po pain meds D/c Magnesium at 12 hr

## 2014-09-01 NOTE — Progress Notes (Signed)
The patient is receiving protonix by the intravenous route.  Based on criteria approved by the Pharmacy and Therapeutics Committee and the Medical Executive Committee, the medication is being converted to the equivalent oral dose form.  These criteria include: -No Active GI bleeding -Able to tolerate diet of full liquids (or better) or tube feeding -Able to tolerate other medications by the oral or enteral route  If you have any questions about this conversion, please contact the Pharmacy Department (ext [phone]).  Thank you.  Natasha BenceCline, Amina Menchaca 09/01/2014

## 2014-09-02 LAB — COMPREHENSIVE METABOLIC PANEL
ALBUMIN: 2.4 g/dL — AB (ref 3.5–5.2)
ALK PHOS: 156 U/L — AB (ref 39–117)
ALT: 106 U/L — ABNORMAL HIGH (ref 0–35)
AST: 145 U/L — ABNORMAL HIGH (ref 0–37)
Anion gap: 5 (ref 5–15)
BILIRUBIN TOTAL: 0.2 mg/dL — AB (ref 0.3–1.2)
BUN: <5 mg/dL — ABNORMAL LOW (ref 6–23)
CHLORIDE: 112 meq/L (ref 96–112)
CO2: 21 mmol/L (ref 19–32)
Calcium: 8 mg/dL — ABNORMAL LOW (ref 8.4–10.5)
Creatinine, Ser: 0.41 mg/dL — ABNORMAL LOW (ref 0.50–1.10)
GFR calc Af Amer: >90 mL/min (ref >90)
GFR calc non Af Amer: >90 mL/min (ref >90)
GLUCOSE: 121 mg/dL — AB (ref 70–99)
POTASSIUM: 3.9 mmol/L (ref 3.5–5.1)
Sodium: 138 mmol/L (ref 135–145)
TOTAL PROTEIN: 5.5 g/dL — AB (ref 6.0–8.3)

## 2014-09-02 LAB — TYPE AND SCREEN
ABO/RH(D): O POS
Antibody Screen: NEGATIVE

## 2014-09-02 LAB — LIPASE, BLOOD: Lipase: 124 U/L — ABNORMAL HIGH (ref 11–59)

## 2014-09-02 LAB — AMYLASE: AMYLASE: 150 U/L — AB (ref 0–105)

## 2014-09-02 MED ORDER — SODIUM CHLORIDE 0.9 % IJ SOLN
3.0000 mL | Freq: Two times a day (BID) | INTRAMUSCULAR | Status: DC
  Administered 2014-09-03 – 2014-09-17 (×24): 3 mL via INTRAVENOUS

## 2014-09-02 MED ORDER — BUTORPHANOL TARTRATE 1 MG/ML IJ SOLN
1.0000 mg | Freq: Once | INTRAMUSCULAR | Status: DC
Start: 2014-09-02 — End: 2014-09-02

## 2014-09-02 MED ORDER — OXYCODONE-ACETAMINOPHEN 5-325 MG PO TABS
1.0000 | ORAL_TABLET | ORAL | Status: DC | PRN
  Administered 2014-09-02 – 2014-09-05 (×14): 2 via ORAL
  Administered 2014-09-05: 1 via ORAL
  Administered 2014-09-05 – 2014-09-18 (×49): 2 via ORAL
  Filled 2014-09-02 (×26): qty 2
  Filled 2014-09-02: qty 1
  Filled 2014-09-02 (×9): qty 2
  Filled 2014-09-02: qty 1
  Filled 2014-09-02 (×4): qty 2
  Filled 2014-09-02: qty 1
  Filled 2014-09-02 (×24): qty 2

## 2014-09-02 NOTE — Progress Notes (Signed)
[redacted]W[redacted]D, Twins, PPROM, pancreatitis  Feels ok today, pain ok, no n.v, tol diet Afeb, VSS FHT- reactive x 2 Labs- LFT's bumped up a bit Will continue abx and rest, plan for delivery at 34 weeks

## 2014-09-02 NOTE — Progress Notes (Signed)
Patient called complaining of sharp pain at IV site and requested to be removed. Patient in tears. IV removed.

## 2014-09-03 LAB — COMPREHENSIVE METABOLIC PANEL
ALK PHOS: 137 U/L — AB (ref 39–117)
ALT: 69 U/L — AB (ref 0–35)
ANION GAP: 6 (ref 5–15)
AST: 43 U/L — AB (ref 0–37)
Albumin: 2.2 g/dL — ABNORMAL LOW (ref 3.5–5.2)
CHLORIDE: 109 meq/L (ref 96–112)
CO2: 22 mmol/L (ref 19–32)
Calcium: 7.8 mg/dL — ABNORMAL LOW (ref 8.4–10.5)
Creatinine, Ser: 0.51 mg/dL (ref 0.50–1.10)
GFR calc Af Amer: >90 mL/min (ref >90)
GFR calc non Af Amer: >90 mL/min (ref >90)
Glucose, Bld: 86 mg/dL (ref 70–99)
Potassium: 3.6 mmol/L (ref 3.5–5.1)
Sodium: 137 mmol/L (ref 135–145)
Total Bilirubin: 0.2 mg/dL — ABNORMAL LOW (ref 0.3–1.2)
Total Protein: 4.3 g/dL — ABNORMAL LOW (ref 6.0–8.3)

## 2014-09-03 LAB — LIPASE, BLOOD: LIPASE: 33 U/L (ref 11–59)

## 2014-09-03 LAB — AMYLASE: Amylase: 47 U/L (ref 0–105)

## 2014-09-03 NOTE — Progress Notes (Signed)
[redacted]W[redacted]D, twins, PPROM, gallstone pancreatitis Doing ok, pain a little better today, labia still swollen Afeb, VSS FHT- reactive NST x 2, irreg ctx Labs- LFTs improved, amylase and lipase also improved Will continue on PO abx, ice prn to perineum for significant labial edema

## 2014-09-04 LAB — COMPREHENSIVE METABOLIC PANEL
ALBUMIN: 2.1 g/dL — AB (ref 3.5–5.2)
ALK PHOS: 125 U/L — AB (ref 39–117)
ALT: 50 U/L — ABNORMAL HIGH (ref 0–35)
AST: 23 U/L (ref 0–37)
Anion gap: 4 — ABNORMAL LOW (ref 5–15)
BUN: 9 mg/dL (ref 6–23)
CHLORIDE: 110 meq/L (ref 96–112)
CO2: 23 mmol/L (ref 19–32)
Calcium: 7.8 mg/dL — ABNORMAL LOW (ref 8.4–10.5)
Creatinine, Ser: 0.35 mg/dL — ABNORMAL LOW (ref 0.50–1.10)
GFR calc Af Amer: >90 mL/min (ref >90)
GFR calc non Af Amer: >90 mL/min (ref >90)
Glucose, Bld: 80 mg/dL (ref 70–99)
POTASSIUM: 3.6 mmol/L (ref 3.5–5.1)
Sodium: 137 mmol/L (ref 135–145)
Total Bilirubin: 0.2 mg/dL — ABNORMAL LOW (ref 0.3–1.2)
Total Protein: 4.5 g/dL — ABNORMAL LOW (ref 6.0–8.3)

## 2014-09-04 LAB — AMYLASE: Amylase: 46 U/L (ref 0–105)

## 2014-09-04 LAB — LIPASE, BLOOD: LIPASE: 32 U/L (ref 11–59)

## 2014-09-04 MED ORDER — PROMETHAZINE HCL 25 MG PO TABS
25.0000 mg | ORAL_TABLET | Freq: Four times a day (QID) | ORAL | Status: DC | PRN
  Administered 2014-09-04 – 2014-09-15 (×2): 25 mg via ORAL
  Filled 2014-09-04 (×2): qty 1

## 2014-09-04 NOTE — Progress Notes (Signed)
[redacted]W[redacted]D, twins, PPROM, gallstone pancreatitis Feeling better, nothing new Afeb, VSS FHT- reactive x 2, occ ctx Labs-improved Continue PO abx and observation

## 2014-09-05 ENCOUNTER — Inpatient Hospital Stay (HOSPITAL_COMMUNITY): Payer: Medicaid Other

## 2014-09-05 LAB — TYPE AND SCREEN
ABO/RH(D): O POS
ANTIBODY SCREEN: NEGATIVE

## 2014-09-05 NOTE — Progress Notes (Signed)
Antenatal Nutrition Assessment:  Currently  32 5/[redacted] weeks gestation, with twins, gallstone pancreatitis. Height  67.5 "  Weight 223 lbs  pre-pregnancy weight 150 lbs .  Pre-pregnancy  BMI 23.2  IBW 137 lbs Total weight gain 73.lbs Weight gain goals 25-35 lbs Estimated needs: 2200-2300 kcal/day, 95-105 grams protein/day, 2.4 liters fluid/day  Low fat diet . Diet order modified to allow double portions and low fat snacks. Pt asleep at time of attempted visit Current diet prescription will provide for increased needs.  Nutrition related labs reviewed Hemoglobin & Hematocrit     Component Value Date/Time   HGB 8.5* 09/01/2014 0529   HCT 25.9* 09/01/2014 0529   CMP     Component Value Date/Time   NA 137 09/04/2014 0535   K 3.6 09/04/2014 0535   CL 110 09/04/2014 0535   CO2 23 09/04/2014 0535   GLUCOSE 80 09/04/2014 0535   BUN 9 09/04/2014 0535   CREATININE 0.35* 09/04/2014 0535   CALCIUM 7.8* 09/04/2014 0535   PROT 4.5* 09/04/2014 0535   ALBUMIN 2.1* 09/04/2014 0535   AST 23 09/04/2014 0535   ALT 50* 09/04/2014 0535   ALKPHOS 125* 09/04/2014 0535   BILITOT 0.2* 09/04/2014 0535   GFRNONAA >90 09/04/2014 0535   GFRAA >90 09/04/2014 0535     Nutrition Dx: Increased nutrient needs r/t pregnancy and fetal growth requirements aeb 32 weeks twin gestation.  No educational needs assessed at this time.  Elisabeth CaraKatherine Reese Senk M.Odis LusterEd. R.D. LDN Neonatal Nutrition Support Specialist/RD III Pager [phone]

## 2014-09-05 NOTE — Plan of Care (Signed)
Problem: Phase II Progression Outcomes Goal: Electronic fetal monitoring(Doppler,Continuous,Intermittent) EFM (Doppler, Continuous, Intermittent)  Outcome: Progressing Monitored QS for 30 minutes.

## 2014-09-05 NOTE — Progress Notes (Signed)
Ur chart review completed.  

## 2014-09-05 NOTE — Progress Notes (Signed)
Patient ID: Mackenzie CollardBrittany L [name], female   DOB: 02/03/1986, 29 y.o.   MRN: [id number]  RN called with pt change of behavior.  32+5 wk, PPROM, twins - very swollen labia with increasing pressure and contractions. Attempt at SVE (after counseling SSE vs SVE in PPROM) not anterior, unable to tolerate exam, ?1cm  FHTs 149, good var; 150 good var - category 1 x 2 toco occ ctx

## 2014-09-05 NOTE — Progress Notes (Signed)
[redacted]W[redacted]D, twins, PPROM, galstone pancreatitis Doing ok, pain is no worse, still significant swelling in labia and lower abdomen that makes it difficult to walk Afeb, VSS FHT- reactive x 2, irreg ctx Abd- NT Continue abx and rest

## 2014-09-06 NOTE — Progress Notes (Signed)
[redacted]W[redacted]D, twins, PPROM, gallstone pancreatitis Still some pelvic pressure but better, no decrease in labial edema, upper abdominal pain improved Afeb, VSS FHT- reactive x 2, no regular ctx U/s-prelim vtx/transverse, cervix 3+ cm long Continue current care

## 2014-09-07 NOTE — Progress Notes (Signed)
Pt off the monitor after reassuring FHR  

## 2014-09-07 NOTE — Progress Notes (Signed)
[redacted]W[redacted]D, twins-vtx/transverse, PPROM Doing ok, occasional pressure, ctx off and on Afeb, VSS FHT- reactive x 2 Continue current care

## 2014-09-08 LAB — TYPE AND SCREEN
ABO/RH(D): O POS
ANTIBODY SCREEN: NEGATIVE

## 2014-09-08 MED ORDER — DIPHENHYDRAMINE HCL 25 MG PO CAPS
50.0000 mg | ORAL_CAPSULE | Freq: Four times a day (QID) | ORAL | Status: DC | PRN
  Administered 2014-09-08 – 2014-09-16 (×12): 50 mg via ORAL
  Filled 2014-09-08 (×12): qty 2

## 2014-09-08 MED ORDER — TERBUTALINE SULFATE 1 MG/ML IJ SOLN
0.2500 mg | Freq: Once | INTRAMUSCULAR | Status: AC
  Administered 2014-09-08: 0.25 mg via SUBCUTANEOUS
  Filled 2014-09-08: qty 1

## 2014-09-08 NOTE — Progress Notes (Signed)
MD notified of contraction pattern and pt c/o pain.  Per MD, po hydrate and reassess in one hour.

## 2014-09-08 NOTE — Progress Notes (Signed)
Unable to determine dilitation/presenting part due to extremely swollen vaginal area.  No VB noted on exam.

## 2014-09-08 NOTE — Progress Notes (Signed)
Pt complains of "itching, burning, stinging" rash across abdomen. She states it's been "getting worse tonight." Red area and raised pinpoint bumps across abdomen. Dr. Jackelyn KnifeMeisinger notified and orders for Benadryl given. Will continue to monitor.

## 2014-09-08 NOTE — Progress Notes (Signed)
[redacted]W[redacted]D, twins, PPROM, vtx/transverse Doing ok, Benadryl helping with rash Afeb, VSS FHT- reactive x 2 Continue current care

## 2014-09-08 NOTE — Progress Notes (Signed)
MD informed of inconclusive VE and orders received to give one dose of Terbutaline.

## 2014-09-08 NOTE — Consult Note (Signed)
Neonatology Consult to Antenatal Patient: 09/08/2014 6:01 PM    I was requested by Dr Meissinger to see this patient in order to provide antenatal counseling due to preterm labor and PPROM (Twin A)  at 6933 1/7 weeks twin gestation.    [name] is a  29 y/o G6P3 who was admitted on 1/15 for gallstone pancreatitis and is now 33 1/[redacted] weeks GA.   She is currently having active labor.  PPROM in Twin "A" last 1/16.  She received a course of BMZ (1/16 and 1/17) as well as  antibiotics.   I spoke with [name] in Room 159.   We discussed in detail what to expect in case of possible delivery of the twin infants in the next few days including morbidity and mortality at this gestational age and usual delivery room resuscitation.  Discussed possible respiratory complications and need for oxygen support, IV access, sepsis work-up, NG/OG feedings ( benefits of BFand MOB desires breast feeding, which was encouraged), length of stay and long-term outcome.  She had a few questions, which I answered but would be glad to come back if she has more questions later.  Thank you for asking us to see this patient and allowing us to participate in her care.  Please call if there are any further questions.   Overton MamMary Ann T Kehaulani Fruin, MD (Attending Neonatologist)  Total length of face-to-face or floor/unit time for this encounter was 20 minutes. Counseling and/or coordination of care was greater than fifty percent of the time above.

## 2014-09-08 NOTE — Consult Note (Signed)
I came to see Ms [name] earlier this evening to give prenatal consult but she was in the shower. Will come back in a.m.  [name], MD Neonatologist

## 2014-09-09 NOTE — Progress Notes (Signed)
Pt not noted in her room, pt often ambulating in the hallway

## 2014-09-09 NOTE — Progress Notes (Signed)
Pt given benadryl 50 mg po for itching on her abd

## 2014-09-09 NOTE — Progress Notes (Signed)
[redacted]W[redacted]D, twins, PPROM, vtx/transverse Had ctx last pm, nurse unable to palpate cervix, ctx resolved with terb Afeb, VSS FHT- reactive x 2 Continue observation

## 2014-09-09 NOTE — Progress Notes (Signed)
Patient up walking to help with her hip pain.

## 2014-09-09 NOTE — Progress Notes (Signed)
Ur chart review completed.  

## 2014-09-09 NOTE — Progress Notes (Signed)
Several RNs adjusting EFM during monitoring session.

## 2014-09-09 NOTE — Progress Notes (Signed)
RN to room and pt not there, pt noted to ambulate prn for back/hip pain.

## 2014-09-10 ENCOUNTER — Encounter (HOSPITAL_COMMUNITY): Payer: Self-pay | Admitting: *Deleted

## 2014-09-10 NOTE — Progress Notes (Signed)
[redacted]W[redacted]D, twins-vtx/transverse, PPROM Headache today, nothing else new Afeb, VSS FHT- reactive x 2 Continue observation, plan for delivery next week

## 2014-09-11 ENCOUNTER — Encounter (HOSPITAL_COMMUNITY): Payer: Self-pay | Admitting: Obstetrics and Gynecology

## 2014-09-11 LAB — TYPE AND SCREEN
ABO/RH(D): O POS
ANTIBODY SCREEN: NEGATIVE

## 2014-09-11 NOTE — Clinical SW OB High Risk (Signed)
Clinical Social Work Department ANTENATAL PSYCHOSOCIAL ASSESSMENT 09/11/2014  Patient:  [name], [name]   Account Number:  [id number]  Admit Date:  08/30/2014     DOB:  Dec 05, 1985   Age:  29 Gestational age on admission:  31.6     Expected delivery date:  09/18/2014 Admitting diagnosis:   repeat gallstone pancreatitis    Clinical Social Worker:  Reece Levy,  Theresia Majors  Date/Time:  09/11/2014 11:31 AM  FAMILY/HOME ENVIRONMENT  Home address:   [address] 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  She reports her brother and sister in law are employed and her grandparents assist her financially   Medicaid (county):  [employer]  School:   completed     Current grade:    Homebound arranged?  N    Cultural/Environmental issues impacting care:   none noted or identified    STRENGTHS / WEAKNESSES / FACTORS TO CONSIDER  Concerns related to hospitalization:   MOB's reports they will induce her on next Wednesday- she is anxious but eager to deliver. " I want them to stay in me as logn as they can to get as ready as they can".  She reports having great support (with monies, childcare, etc) from her brother and sister in law as well as her grand parents-   Previous pregnancies/feelings towards pregnancy?  Concerns related to being/becoming a mother?   She reports being surprised when she found out she was pregnant and "shocked" with twins- MOB's mother died one year ago this month and she finds comfort in knowing they are "the first girls born since she died"- she also  shared with CSW plans to name them after her to honor her-  "I was very close to my mom and I've struggled over the last year to not get depressed".  She feels the twins will be a way of "keeping her here with [place]".   Social support (FOB? Who is/will be helping with baby/other kids)   MOB is uncertain of who is the FOB's- she plans to do paternity testing on the twins- she also is working to get Child Support for her 7yo- she reports getting it in the past but when his SSI stopped so did her payments. The father of her 3yo provides assistance with clothes, etc as he can and also "claims" to be the father of her 2yo.    She currently relies on hergrandparents who live next door and her  brother and sister in law to help as they all live together in a 3 BR house- "we are looking for a bigger home now".   Couples relationship:   MOB's is uncertain if the twins are her "ex or best friend". She plans to get paternity testing.  Her relationship with her "ex" has been "rocky" but isuncertain of what his involvement will/would be.   Recent stressful life events (life changes in past year?):   MOB's lost her mother on 09/08/13- "the one year mark was hard for me"- CSW allowed MOB's to  share memories as well as reflect on the loss and the twins' being her "mother carrying on in them".   Prenatal care/education/home preparations?   MOB's reports having most of her needed supplies for babies at home-her baby shower was cancelled bc of this hospitalization and they are hoping to move it to the hospital before she delivers. She denies needing any supplies and anticiapates getting "double of everything and more".   Domestic violence (of any type):  N If yes to domestic violence describe/action plan:  Substance use during pregnancy.  (If YES, complete SBIRT):  N  Complete PHQ-9 (Depression Screening) on all antenatal patients.  PHQ-9 score:    (IF SCORE => 15 complete TREAT)  Follow up recommendations:   CSW  will plan to follow up with MOB's during her stay as well as post delivery.   Patient advised/response?   Patient appreciative and agreeable to plans   Other:   MOB's shared with CSW that she was diagnosed with Cervical cancer while pregnant with her 2yo son. "I plan to have them take everything out once the twins arrive- I don't want any more kids".    Clinical Assessment/Plan CSW found MOB's to be very open, engaged and excited about her twins- "these are the first twins in our family". She also sees the twin girls as "given gift" that will keep her deceased mother with her and she is very comforted by this.    MOB's requesting information on Section 8 housing as well as contact info for Ms Julaine Fusiotten with [employer]- CSW will attempt to assist with this and advise.   Reece LevyJanet Lenwood Balsam, MSW, Theresia MajorsLCSWA

## 2014-09-11 NOTE — Progress Notes (Signed)
[redacted]W[redacted]D twins, PPROM C/o some headaches, nothing else new Afeb, VSS FHT- reactive x 2 I reviewed dating, somebody had entered wrong EDC.  Her prenatal records clearly show EDC 3-11 by a sure LMP c/w early u/s, so her EDC has been changed from 3-12 to 3-11.  I discussed induction once 34 weeks, she would like to wait until I am on call next Wednesday unless something happens in the meantime.

## 2014-09-12 NOTE — Progress Notes (Signed)
   09/12/14 1000  Clinical Encounter Type  Visited With Patient  Visit Type Spiritual support;Social support  Referral From Nurse  Spiritual Encounters  Spiritual Needs Emotional;Grief support (mom died less than a year ago)  Stress Factors  Patient Stress Factors Financial concerns;Major life changes (hx PPhx PPD with first child; grieving mom)   Ms Tawanna Coolerodd was in good spirits and shared frankly about her living situation, three older children (7, 3, almost 2), hx postpartum depression with oldest child, and grieving her mom as she faces first delivery without her.  Per pt, she lives with brother and sister-in-law (expecting a baby in August), and next to her grandparents, who are a big source of support.  Per pt, her stepdad is also supportive, providing some childcare and planning to help her get a Zenaida Niecevan this spring.  We talked in detail about her self-care plan in the hospital (walks, phone calls with loved ones, visits from family); encouraged her to share frankly with MD about how she is doing emotionally, especially after babies come, and she is balancing recovery/NICU visits/care of family at home.  Pt receptive of pastoral presence, welcomes follow-up, aware of ongoing chaplain availability.  Will follow for support, but please also page as needs arise. Thank you.  [address]Chaplain Paullette Mckain [place], [place] [phone]

## 2014-09-12 NOTE — Progress Notes (Signed)
Ur chart review completed.  

## 2014-09-12 NOTE — Progress Notes (Signed)
[redacted]W[redacted]D, twins, PPROM Still itching on abdomen, no other current problems, +FM Afeb, VSS FHT- reactive x 2 Continue observation, she would still like to wait until next Wednesday for induction

## 2014-09-13 NOTE — Progress Notes (Signed)
[redacted]W[redacted]D, twins, PPROM Nothing new Afeb, VSS FHT- reactive x 2 Will continue observation, do GBS culture today in preparation for delivery next week

## 2014-09-14 LAB — TYPE AND SCREEN
ABO/RH(D): O POS
Antibody Screen: NEGATIVE

## 2014-09-14 NOTE — Progress Notes (Signed)
34 1/7 weeks twins vtx/transverse  PPROM Pt c/o low back pain from sleep postion, has flexeril for prn use afeb VSS FHR reactive x 2 last pm  GBS pending IOL planned next week on 09/18/14

## 2014-09-15 LAB — OB RESULTS CONSOLE GBS: GBS: NEGATIVE

## 2014-09-15 LAB — CULTURE, BETA STREP (GROUP B ONLY)

## 2014-09-15 NOTE — Progress Notes (Signed)
Patient ID: Mackenzie CollardBrittany L [name], female   DOB: 02/22/1986, 29 y.o.   MRN: [id number] PPROM twins/ 34 2/7 weeks  Pt doing well, some intermittent contractions, but nothing regular.  Occaasional LOF No N/V  FHR reactive x 2 last PM (category 1 x 2)  afeb vss Gravid NT  Twins vtx/transverse Plan IOL this week 2/3 GBS still shows as pending, will ask RN to check on it.

## 2014-09-16 NOTE — Progress Notes (Signed)
C/o itching

## 2014-09-16 NOTE — Progress Notes (Signed)
[redacted]W[redacted]D, PPROM, twins Doing ok, nothing new Afeb, VSS FHT- reactive x 2 as far as I can tell, tracings are not always separate GBS-neg Continue observation, induction in 2 days

## 2014-09-17 LAB — TYPE AND SCREEN
ABO/RH(D): O POS
Antibody Screen: NEGATIVE

## 2014-09-17 NOTE — Progress Notes (Signed)
[redacted]W[redacted]D, Twins, PPROM Nothing new Afeb, VSS FHT- reactive x 2 For induction tomorrow morning.

## 2014-09-18 ENCOUNTER — Encounter (HOSPITAL_COMMUNITY): Payer: Self-pay | Admitting: *Deleted

## 2014-09-18 ENCOUNTER — Inpatient Hospital Stay (HOSPITAL_COMMUNITY): Payer: Medicaid Other | Admitting: Anesthesiology

## 2014-09-18 ENCOUNTER — Encounter (HOSPITAL_COMMUNITY)
Admission: AD | Disposition: A | Discharge status: Home / Self Care | Payer: Self-pay | Source: Ambulatory Visit | Attending: Obstetrics and Gynecology

## 2014-09-18 LAB — CBC
HCT: 23.4 % — ABNORMAL LOW (ref 36.0–46.0)
Hemoglobin: 7.6 g/dL — ABNORMAL LOW (ref 12.0–15.0)
MCH: 28.6 pg (ref 26.0–34.0)
MCHC: 32.5 g/dL (ref 30.0–36.0)
MCV: 88 fL (ref 78.0–100.0)
Platelets: 218 10*3/uL (ref 150–400)
RBC: 2.66 MIL/uL — ABNORMAL LOW (ref 3.87–5.11)
RDW: 14.3 % (ref 11.5–15.5)
WBC: 8.9 10*3/uL (ref 4.0–10.5)

## 2014-09-18 SURGERY — Surgical Case
Anesthesia: Epidural | Site: Vagina

## 2014-09-18 MED ORDER — CITRIC ACID-SODIUM CITRATE 334-500 MG/5ML PO SOLN
30.0000 mL | ORAL | Status: DC | PRN
  Filled 2014-09-18 (×2): qty 15

## 2014-09-18 MED ORDER — BUTORPHANOL TARTRATE 1 MG/ML IJ SOLN: 1.0000 mg | INTRAMUSCULAR | Status: DC | PRN

## 2014-09-18 MED ORDER — OXYTOCIN BOLUS FROM INFUSION
500.0000 mL | INTRAVENOUS | Status: DC
  Administered 2014-09-18: 500 mL via INTRAVENOUS

## 2014-09-18 MED ORDER — LACTATED RINGERS IV SOLN
INTRAVENOUS | Status: DC
  Administered 2014-09-18 (×3): via INTRAVENOUS

## 2014-09-18 MED ORDER — PHENYLEPHRINE 40 MCG/ML (10ML) SYRINGE FOR IV PUSH (FOR BLOOD PRESSURE SUPPORT)
80.0000 ug | PREFILLED_SYRINGE | INTRAVENOUS | Status: DC | PRN
  Filled 2014-09-18: qty 2

## 2014-09-18 MED ORDER — PHENYLEPHRINE 40 MCG/ML (10ML) SYRINGE FOR IV PUSH (FOR BLOOD PRESSURE SUPPORT)
80.0000 ug | PREFILLED_SYRINGE | INTRAVENOUS | Status: DC | PRN
  Filled 2014-09-18: qty 20
  Filled 2014-09-18: qty 2

## 2014-09-18 MED ORDER — FENTANYL CITRATE 0.05 MG/ML IJ SOLN
50.0000 ug | Freq: Once | INTRAMUSCULAR | Status: AC
  Administered 2014-09-18: 50 ug via INTRAVENOUS
  Filled 2014-09-18: qty 2

## 2014-09-18 MED ORDER — LIDOCAINE HCL (PF) 1 % IJ SOLN
30.0000 mL | INTRAMUSCULAR | Status: DC | PRN
  Filled 2014-09-18: qty 30

## 2014-09-18 MED ORDER — ONDANSETRON HCL 4 MG/2ML IJ SOLN: 4.0000 mg | Freq: Four times a day (QID) | INTRAMUSCULAR | Status: DC | PRN

## 2014-09-18 MED ORDER — LACTATED RINGERS IV SOLN: 500.0000 mL | INTRAVENOUS | Status: DC | PRN

## 2014-09-18 MED ORDER — LACTATED RINGERS IV SOLN
INTRAVENOUS | Status: DC
  Administered 2014-09-18: 22:00:00 via INTRAVENOUS

## 2014-09-18 MED ORDER — OXYCODONE-ACETAMINOPHEN 5-325 MG PO TABS: 2.0000 | ORAL_TABLET | ORAL | Status: DC | PRN

## 2014-09-18 MED ORDER — TERBUTALINE SULFATE 1 MG/ML IJ SOLN
0.2500 mg | Freq: Once | INTRAMUSCULAR | Status: AC | PRN
  Filled 2014-09-18: qty 1

## 2014-09-18 MED ORDER — DIPHENHYDRAMINE HCL 50 MG/ML IJ SOLN: 12.5000 mg | INTRAMUSCULAR | Status: DC | PRN

## 2014-09-18 MED ORDER — EPHEDRINE 5 MG/ML INJ
10.0000 mg | INTRAVENOUS | Status: DC | PRN
  Filled 2014-09-18: qty 2

## 2014-09-18 MED ORDER — MISOPROSTOL 200 MCG PO TABS
ORAL_TABLET | ORAL | Status: AC
  Filled 2014-09-18: qty 4

## 2014-09-18 MED ORDER — LACTATED RINGERS IV SOLN
500.0000 mL | Freq: Once | INTRAVENOUS | Status: AC
  Administered 2014-09-18: 1000 mL via INTRAVENOUS

## 2014-09-18 MED ORDER — FENTANYL 2.5 MCG/ML BUPIVACAINE 1/10 % EPIDURAL INFUSION (WH - ANES)
INTRAMUSCULAR | Status: DC | PRN
  Administered 2014-09-18: 14 mL/h via EPIDURAL

## 2014-09-18 MED ORDER — OXYTOCIN 40 UNITS IN LACTATED RINGERS INFUSION - SIMPLE MED
1.0000 m[IU]/min | INTRAVENOUS | Status: DC
  Administered 2014-09-18: 2 m[IU]/min via INTRAVENOUS
  Filled 2014-09-18: qty 1000

## 2014-09-18 MED ORDER — OXYCODONE-ACETAMINOPHEN 5-325 MG PO TABS
1.0000 | ORAL_TABLET | ORAL | Status: DC | PRN
  Administered 2014-09-18: 1 via ORAL
  Filled 2014-09-18: qty 1

## 2014-09-18 MED ORDER — OXYTOCIN 40 UNITS IN LACTATED RINGERS INFUSION - SIMPLE MED
62.5000 mL/h | INTRAVENOUS | Status: DC
  Administered 2014-09-18: 62.5 mL/h via INTRAVENOUS

## 2014-09-18 MED ORDER — FENTANYL 2.5 MCG/ML BUPIVACAINE 1/10 % EPIDURAL INFUSION (WH - ANES)
14.0000 mL/h | INTRAMUSCULAR | Status: DC | PRN
  Administered 2014-09-18 (×2): 14 mL/h via EPIDURAL
  Filled 2014-09-18 (×3): qty 125

## 2014-09-18 MED ORDER — LIDOCAINE HCL (PF) 1 % IJ SOLN
INTRAMUSCULAR | Status: DC | PRN
  Administered 2014-09-18: 4 mL
  Administered 2014-09-18: 5 mL
  Administered 2014-09-18: 4 mL

## 2014-09-18 SURGICAL SUPPLY — 34 items
CLAMP CORD UMBIL (MISCELLANEOUS) IMPLANT
CLOTH BEACON ORANGE TIMEOUT ST (SAFETY) ×3 IMPLANT
CONTAINER PREFILL 10% NBF 15ML (MISCELLANEOUS) IMPLANT
DRAPE SHEET LG 3/4 BI-LAMINATE (DRAPES) ×4 IMPLANT
DRSG OPSITE POSTOP 4X10 (GAUZE/BANDAGES/DRESSINGS) ×1 IMPLANT
DURAPREP 26ML APPLICATOR (WOUND CARE) ×1 IMPLANT
ELECT REM PT RETURN 9FT ADLT (ELECTROSURGICAL)
ELECTRODE REM PT RTRN 9FT ADLT (ELECTROSURGICAL) ×1 IMPLANT
EXTRACTOR VACUUM KIWI (MISCELLANEOUS) IMPLANT
EXTRACTOR VACUUM M CUP 4 TUBE (SUCTIONS) IMPLANT
GLOVE BIOGEL PI IND STRL 7.5 (GLOVE) ×1 IMPLANT
GLOVE BIOGEL PI INDICATOR 7.5 (GLOVE) ×1
GLOVE ECLIPSE 7.5 STRL STRAW (GLOVE) ×2 IMPLANT
GLOVE ORTHO TXT STRL SZ7.5 (GLOVE) ×3 IMPLANT
GOWN STRL REUS W/TWL LRG LVL3 (GOWN DISPOSABLE) ×6 IMPLANT
KIT ABG SYR 3ML LUER SLIP (SYRINGE) IMPLANT
NDL HYPO 25X5/8 SAFETYGLIDE (NEEDLE) ×1 IMPLANT
NEEDLE HYPO 25X5/8 SAFETYGLIDE (NEEDLE) ×3 IMPLANT
NS IRRIG 1000ML POUR BTL (IV SOLUTION) ×1 IMPLANT
PACK C SECTION WH (CUSTOM PROCEDURE TRAY) ×3 IMPLANT
PAD OB MATERNITY 4.3X12.25 (PERSONAL CARE ITEMS) ×1 IMPLANT
RTRCTR C-SECT PINK 25CM LRG (MISCELLANEOUS) ×1 IMPLANT
STAPLER VISISTAT 35W (STAPLE) IMPLANT
SUT CHROMIC 1 CTX 36 (SUTURE) ×2 IMPLANT
SUT PLAIN 0 NONE (SUTURE) IMPLANT
SUT PLAIN 2 0 XLH (SUTURE) IMPLANT
SUT VIC AB 0 CT1 27 (SUTURE)
SUT VIC AB 0 CT1 27XBRD ANBCTR (SUTURE) ×2 IMPLANT
SUT VIC AB 2-0 CT1 (SUTURE) ×1 IMPLANT
SUT VIC AB 2-0 CT1 27 (SUTURE)
SUT VIC AB 2-0 CT1 TAPERPNT 27 (SUTURE) ×1 IMPLANT
SUT VIC AB 4-0 KS 27 (SUTURE) IMPLANT
TOWEL OR 17X24 6PK STRL BLUE (TOWEL DISPOSABLE) ×5 IMPLANT
TRAY FOLEY CATH 14FR (SET/KITS/TRAYS/PACK) ×1 IMPLANT

## 2014-09-18 NOTE — Anesthesia Procedure Notes (Addendum)
Epidural Patient location during procedure: OB Start time: 09/18/2014 10:10 AM  Staffing Anesthesiologist: Felipe DroneJUDD, Kaedence Connelly JENNETTE Performed by: anesthesiologist   Preanesthetic Checklist Completed: patient identified, site marked, surgical consent, pre-op evaluation, timeout performed, IV checked, risks and benefits discussed and monitors and equipment checked  Epidural Patient position: sitting Prep: site prepped and draped and DuraPrep Patient monitoring: continuous pulse ox and blood pressure Approach: midline Location: L3-L4 Injection technique: LOR air  Needle:  Needle type: Tuohy  Needle gauge: 17 G Needle length: 9 cm and 9 Needle insertion depth: 6 cm Catheter type: closed end flexible Catheter size: 19 Gauge Catheter at skin depth: 11 cm Test dose: negative  Assessment Events: blood not aspirated, injection not painful, no injection resistance, negative IV test and no paresthesia  Additional Notes Patient identified. Risks/Benefits/Options discussed with patient including but not limited to bleeding, infection, nerve damage, paralysis, failed block, incomplete pain control, headache, blood pressure changes, nausea, vomiting, reactions to medication both or allergic, itching and postpartum back pain. Confirmed with bedside nurse the patient's most recent platelet count. Confirmed with patient that they are not currently taking any anticoagulation, have any bleeding history or any family history of bleeding disorders. Patient expressed understanding and wished to proceed. All questions were answered. Sterile technique was used throughout the entire procedure. Please see nursing notes for vital signs. Test dose was given through epidural catheter and negative prior to continuing to dose epidural or start infusion. Warning signs of high block given to the patient including shortness of breath, tingling/numbness in hands, complete motor block, or any concerning symptoms with  instructions to call for help. Patient was given instructions on fall risk and not to get out of bed. All questions and concerns addressed with instructions to call with any issues or inadequate analgesia.

## 2014-09-18 NOTE — Progress Notes (Signed)
Comfortable with epidural Afeb, VSS FHT- Cat I x 2, ctx q 3-5 min VE-5/80/0, vtx, IUPC inserted Will continue to adjust pitocin as needed and monitor progress

## 2014-09-18 NOTE — Anesthesia Preprocedure Evaluation (Signed)
Anesthesia Evaluation  Patient identified by MRN, date of birth, ID band Patient awake    Reviewed: Allergy & Precautions, NPO status , Patient's Chart, lab work & pertinent test results  History of Anesthesia Complications Negative for: history of anesthetic complications  Airway Mallampati: III  TM Distance: >3 FB Neck ROM: Full    Dental no notable dental hx. (+) Missing, Dental Advisory Given   Pulmonary asthma , Current Smoker,  breath sounds clear to auscultation  Pulmonary exam normal       Cardiovascular hypertension (hx of PIH, not currently), Rhythm:Regular Rate:Normal     Neuro/Psych  Headaches, PSYCHIATRIC DISORDERS Anxiety Depression    GI/Hepatic negative GI ROS, Neg liver ROS,   Endo/Other  negative endocrine ROS  Renal/GU negative Renal ROS  negative genitourinary   Musculoskeletal negative musculoskeletal ROS (+)   Abdominal   Peds negative pediatric ROS (+)  Hematology  (+) anemia ,   Anesthesia Other Findings   Reproductive/Obstetrics (+) Pregnancy twins                             Anesthesia Physical Anesthesia Plan  ASA: II  Anesthesia Plan: Epidural   Post-op Pain Management:    Induction:   Airway Management Planned:   Additional Equipment:   Intra-op Plan:   Post-operative Plan:   Informed Consent: I have reviewed the patients History and Physical, chart, labs and discussed the procedure including the risks, benefits and alternatives for the proposed anesthesia with the patient or authorized representative who has indicated his/her understanding and acceptance.   Dental advisory given  Plan Discussed with:   Anesthesia Plan Comments:         Anesthesia Quick Evaluation

## 2014-09-18 NOTE — Consult Note (Signed)
Neonatology Note:   Attendance at Delivery:    I was asked by Dr. Jackelyn KnifeMeisinger to attend this NSVD at 34 5/7 weeks due to twin gestation. The mother is a G6P3A2 O pos, GBS neg being induced due to gallstone pancreatitis. She has a history of HPV, is a cigarette smoker, and started [facility] at [facility]. She had some pre-eclampsia, but did not require treatment. Rupture of a forebag occurred on 1/16, but fluid was fern negative. There was a large gush of fluid today, < 12 hours before delivery of Twin A, fluid clear. AROM for Twin B occurred just before delivery.  Twin A, a female, was vigorous with good spontaneous cry and tone. Needed only minimal bulb suctioning. Ap 9/9. Lungs clear to ausc in DR. Shown to her mother and transported to NICU.  Twin B, a female, was delivered double footling breech. She was dusky but with good tone and HR, and started crying well at about 1 minute. She pinked up very well without any intervention. Ap 7/9. Lungs clear, no distress. She was held briefly by her mother, then transported to the NICU for further care.  The MGM, mother's support person, was in attendance.   Doretha Souhristie C. Reeanna Acri, MD

## 2014-09-18 NOTE — Progress Notes (Signed)
Comfortable with epidural Afeb, VSS FHT- Cat I x 2 VE 4 cm per RN Continue pitocin and monitor progress

## 2014-09-18 NOTE — Progress Notes (Signed)
On L&D for induction Afeb, VSS FHT- Cat I x 2 when both on monitor, irreg cts VE-2-3 per RN Continue pitocin, monitor progress.  Will deliver in OR since baby B transverse, discussed delivery plan.

## 2014-09-19 ENCOUNTER — Encounter (HOSPITAL_COMMUNITY): Payer: Self-pay | Admitting: Obstetrics and Gynecology

## 2014-09-19 LAB — CBC
HEMATOCRIT: 23.6 % — AB (ref 36.0–46.0)
HEMOGLOBIN: 7.7 g/dL — AB (ref 12.0–15.0)
MCH: 28.7 pg (ref 26.0–34.0)
MCHC: 32.6 g/dL (ref 30.0–36.0)
MCV: 88.1 fL (ref 78.0–100.0)
Platelets: 191 10*3/uL (ref 150–400)
RBC: 2.68 MIL/uL — ABNORMAL LOW (ref 3.87–5.11)
RDW: 14.1 % (ref 11.5–15.5)
WBC: 11.9 10*3/uL — ABNORMAL HIGH (ref 4.0–10.5)

## 2014-09-19 LAB — RPR: RPR Ser Ql: NONREACTIVE

## 2014-09-19 MED ORDER — ONDANSETRON HCL 4 MG/2ML IJ SOLN: 4.0000 mg | INTRAMUSCULAR | Status: DC | PRN

## 2014-09-19 MED ORDER — METHYLERGONOVINE MALEATE 0.2 MG/ML IJ SOLN
0.2000 mg | INTRAMUSCULAR | Status: DC | PRN
Start: 2014-09-19 — End: 2014-09-20

## 2014-09-19 MED ORDER — SIMETHICONE 80 MG PO CHEW: 80.0000 mg | CHEWABLE_TABLET | ORAL | Status: DC | PRN

## 2014-09-19 MED ORDER — SENNOSIDES-DOCUSATE SODIUM 8.6-50 MG PO TABS
2.0000 | ORAL_TABLET | ORAL | Status: DC
  Administered 2014-09-19 (×2): 2 via ORAL
  Filled 2014-09-19 (×2): qty 2

## 2014-09-19 MED ORDER — LANOLIN HYDROUS EX OINT: TOPICAL_OINTMENT | CUTANEOUS | Status: DC | PRN

## 2014-09-19 MED ORDER — PNEUMOCOCCAL VAC POLYVALENT 25 MCG/0.5ML IJ INJ
0.5000 mL | INJECTION | INTRAMUSCULAR | Status: AC
  Administered 2014-09-20: 0.5 mL via INTRAMUSCULAR
  Filled 2014-09-19: qty 0.5

## 2014-09-19 MED ORDER — ZOLPIDEM TARTRATE 5 MG PO TABS
5.0000 mg | ORAL_TABLET | Freq: Every evening | ORAL | Status: DC | PRN
  Administered 2014-09-19: 5 mg via ORAL
  Filled 2014-09-19: qty 1

## 2014-09-19 MED ORDER — OXYCODONE-ACETAMINOPHEN 5-325 MG PO TABS
2.0000 | ORAL_TABLET | ORAL | Status: DC | PRN
  Administered 2014-09-19 – 2014-09-20 (×5): 2 via ORAL
  Filled 2014-09-19 (×6): qty 2

## 2014-09-19 MED ORDER — DIPHENHYDRAMINE HCL 25 MG PO CAPS: 25.0000 mg | ORAL_CAPSULE | Freq: Four times a day (QID) | ORAL | Status: DC | PRN

## 2014-09-19 MED ORDER — MEASLES, MUMPS & RUBELLA VAC ~~LOC~~ INJ
0.5000 mL | INJECTION | Freq: Once | SUBCUTANEOUS | Status: DC
  Filled 2014-09-19: qty 0.5

## 2014-09-19 MED ORDER — ONDANSETRON HCL 4 MG PO TABS: 4.0000 mg | ORAL_TABLET | ORAL | Status: DC | PRN

## 2014-09-19 MED ORDER — IBUPROFEN 600 MG PO TABS
600.0000 mg | ORAL_TABLET | Freq: Four times a day (QID) | ORAL | Status: DC
  Administered 2014-09-19 – 2014-09-20 (×7): 600 mg via ORAL
  Filled 2014-09-19 (×7): qty 1

## 2014-09-19 MED ORDER — DIBUCAINE 1 % RE OINT: 1.0000 "application " | TOPICAL_OINTMENT | RECTAL | Status: DC | PRN

## 2014-09-19 MED ORDER — TETANUS-DIPHTH-ACELL PERTUSSIS 5-2.5-18.5 LF-MCG/0.5 IM SUSP: 0.5000 mL | Freq: Once | INTRAMUSCULAR | Status: DC

## 2014-09-19 MED ORDER — OXYCODONE-ACETAMINOPHEN 5-325 MG PO TABS: 1.0000 | ORAL_TABLET | ORAL | Status: DC | PRN

## 2014-09-19 MED ORDER — MAGNESIUM HYDROXIDE 400 MG/5ML PO SUSP: 30.0000 mL | ORAL | Status: DC | PRN

## 2014-09-19 MED ORDER — WITCH HAZEL-GLYCERIN EX PADS: 1.0000 "application " | MEDICATED_PAD | CUTANEOUS | Status: DC | PRN

## 2014-09-19 MED ORDER — PRENATAL MULTIVITAMIN CH
1.0000 | ORAL_TABLET | Freq: Every day | ORAL | Status: DC
  Administered 2014-09-20: 1 via ORAL
  Filled 2014-09-19: qty 1

## 2014-09-19 MED ORDER — BENZOCAINE-MENTHOL 20-0.5 % EX AERO
1.0000 "application " | INHALATION_SPRAY | CUTANEOUS | Status: DC | PRN
  Administered 2014-09-19: 1 via TOPICAL
  Filled 2014-09-19: qty 56

## 2014-09-19 MED ORDER — METHYLERGONOVINE MALEATE 0.2 MG PO TABS
0.2000 mg | ORAL_TABLET | ORAL | Status: DC | PRN
Start: 2014-09-19 — End: 2014-09-20

## 2014-09-19 NOTE — Anesthesia Postprocedure Evaluation (Signed)
Anesthesia Post Note  Patient: [name]  Procedure(s) Performed: Procedure(s) (LRB): VAGINAL DELIVERY (N/A)  Anesthesia type: Epidural  Patient location: Mother/Baby  Post pain: Pain level controlled  Post assessment: Post-op Vital signs reviewed  Last Vitals:  Filed Vitals:   09/19/14 0504  BP: 124/76  Pulse: 89  Temp: 36.7 C  Resp: 18    Post vital signs: Reviewed  Level of consciousness:alert  Complications: No apparent anesthesia complications

## 2014-09-19 NOTE — Progress Notes (Signed)
CSW attempted to meet with the MOB in order to introduce role of CSW and to provide support due to twin's NICU admission.  MOB requested that CSW return at a later time since she was pumping.   CSW will make additional attempts either in MOB's room or at baby's beside.  

## 2014-09-19 NOTE — Transfer of Care (Signed)
Immediate Anesthesia Transfer of Care Note  Patient: Elgie CollardBrittany L Biebel  Procedure(s) Performed: Procedure(s): VAGINAL DELIVERY (N/A)  Patient Location: Birthing Suites  Anesthesia Type:Epidural  Level of Consciousness: awake, alert  and oriented  Airway & Oxygen Therapy: Patient Spontanous Breathing  Post-op Assessment: Report given to RN  Post vital signs: Reviewed and stable  Complications: No apparent anesthesia complications

## 2014-09-19 NOTE — Lactation Note (Signed)
This note was copied from the chart of [name]. Lactation Consultation Note  Patient Name: [name] Today's Date: 09/19/2014 Reason for consult: Initial assessment Twin girls, 12 hours of life, in NICU. Mom is an experienced BF mom of three older children--1, 2, and 3 months successively. Mom states that she is familiar with pump, and has her own personal DEBP at home for use after D/C. Enc mom to pump every 3 hours. Mom states that she pumped early this morning after visiting with the twins in NICU and she is starting to see colostrum. Mom given tiny containers for collecting colostrum to take/send to NICU. Enc mom to hand express after pumping. Mom states that she has always had a good supply of breastmilk with each child. Mom aware of the pumping rooms in NICU. Mom states that she will eat breakfast and then start pumping.   Mom given NICU booklet and LC brochure. Mom is aware of OP/BFSG, community resources, and LC phone line assistance after D/C.   Maternal Data Has patient been taught Hand Expression?: Yes (Per mom.) Does the patient have breastfeeding experience prior to this delivery?: Yes  Feeding    LATCH Score/Interventions                      Lactation Tools Discussed/Used Pump Review: Setup, frequency, and cleaning Initiated by:: JW Date initiated:: 09/19/14   Consult Status Consult Status: Follow-up Date: 09/19/14 Follow-up type: In-patient    [name] 09/19/2014, 10:12 AM    

## 2014-09-19 NOTE — Progress Notes (Signed)
PPD #1 Cramping, babies stable in NICU Afeb, VSS Fundus firm, NT at U-1 Hgb unchanged Continue routine postpartum care

## 2014-09-20 ENCOUNTER — Ambulatory Visit: Payer: Self-pay

## 2014-09-20 MED ORDER — IBUPROFEN 600 MG PO TABS: 600.0000 mg | ORAL_TABLET | Freq: Four times a day (QID) | ORAL | Status: DC

## 2014-09-20 MED ORDER — OXYCODONE-ACETAMINOPHEN 5-325 MG PO TABS: 1.0000 | ORAL_TABLET | ORAL | Status: DC | PRN

## 2014-09-20 NOTE — Progress Notes (Signed)
Pt verbalizes understanding of d/c instructions, medications, follow up instructions and when to seek medical care. Encouraged pt to check room thoroughly for belongings. All questions answered. No IV at this time. Pt plans to visit babies in NICU prior to leaving. Pt is waiting for her ride. Lactation plans to see pt prior to her d/c. Sheryn BisonGordon, Anah Billard Warner

## 2014-09-20 NOTE — Discharge Instructions (Signed)
As per discharge pamphlet °

## 2014-09-20 NOTE — Discharge Summary (Signed)
Obstetric Discharge Summary Reason for Admission: rupture of membranes, gallstone pancreatitis Prenatal Procedures: NST and ultrasound Intrapartum Procedures: spontaneous vaginal delivery Postpartum Procedures: none Complications-Operative and Postpartum: none HEMOGLOBIN  Date Value Ref Range Status  09/19/2014 7.7* 12.0 - 15.0 g/dL Final   HCT  Date Value Ref Range Status  09/19/2014 23.6* 36.0 - 46.0 % Final    Physical Exam:  General: alert Lochia: appropriate Uterine Fundus: firm   Discharge Diagnoses: Di/Di twin pregnancy at 34 weeks, PPROM, gallstone pancreatitis   Discharge Information: Date: 09/20/2014 Activity: pelvic rest Diet: routine Medications: Ibuprofen and Percocet Condition: stable Instructions: refer to practice specific booklet Discharge to: home Follow-up Information    Follow up with Bethanny Toelle,Mcquerry D, MD. Schedule an appointment as soon as possible for a visit in 6 weeks.   Specialty:  Obstetrics and Gynecology   Contact information:   [address], [address] [place] [address] [phone]       Newborn Data:   Napoleon Formodd, GirlA Alisah [[id number]][[id number]]  Live born female  Birth Weight: 4 lb 5.1 oz (1959 g) APGAR: 9, 9   Sherley Boundsodd, GirlB [place]Brittany [[id number]][[id number]]  Live born female  Birth Weight: 4 lb 11.5 oz (2140 g) APGAR: 7, 9   Both babies stable in NICU.  Taite Schoeppner,Elliff D 09/20/2014, 8:38 AM

## 2014-09-20 NOTE — Progress Notes (Signed)
Pt is leaving at this time. Pt is walking with her grandmother to visit babies in NICU prior to leaving. Pts grandfather will be driving her home. Belongings packed up and grandfather took them to the car. Sheryn BisonGordon, Ezri Landers Warner

## 2014-09-20 NOTE — Lactation Note (Signed)
This note was copied from the chart of [name]. Lactation Consultation Note    Follow up consult with this mom of NICU twins, now 5841 hours old, and 35 weeks CGA. Mom is pumping but not getting colostrum or milk yet. She was discharged to home,and  Loaned a DEP. I also observed mom pumping, and decreased her to 24 flanges with a good fit. Mom ws advised to pump both breast at the same time. She said it hurt too much to pump both. I explained it is time saving and will increase her supply. Mom was also told she can begin  Putting her babies to the breast to nuzzle,. Mom knows to call for lactation as needed.   Patient Name: [name] [name] [id number]'WToday's Date: 09/20/2014 Reason for consult: Follow-up assessment   Maternal Data    Feeding    LATCH Score/Interventions                      Lactation Tools Discussed/Used [place] Pump Review: Setup, frequency, and cleaning   Consult Status Consult Status: PRN Follow-up type: In-patient (NICU)    Alfred LevinsLee, Wilburt Messina Anne 09/20/2014, 3:25 PM

## 2014-09-20 NOTE — Progress Notes (Signed)
PPD #2 Still some cramping, overall doing well, babies stable in NICU Afeb, VSS Fundus firm D/c home

## 2014-10-17 ENCOUNTER — Encounter (HOSPITAL_COMMUNITY): Payer: Self-pay | Admitting: Emergency Medicine

## 2014-10-17 ENCOUNTER — Emergency Department (HOSPITAL_COMMUNITY)
Admission: EM | Admit: 2014-10-17 | Discharge: 2014-10-18 | Disposition: A | Discharge status: Home / Self Care | Payer: Medicaid Other | Attending: Emergency Medicine | Admitting: Emergency Medicine

## 2014-10-17 DIAGNOSIS — D649 Anemia, unspecified: Secondary | ICD-10-CM | POA: Insufficient documentation

## 2014-10-17 DIAGNOSIS — Z8619 Personal history of other infectious and parasitic diseases: Secondary | ICD-10-CM | POA: Diagnosis not present

## 2014-10-17 DIAGNOSIS — M79602 Pain in left arm: Secondary | ICD-10-CM | POA: Diagnosis not present

## 2014-10-17 DIAGNOSIS — F329 Major depressive disorder, single episode, unspecified: Secondary | ICD-10-CM | POA: Insufficient documentation

## 2014-10-17 DIAGNOSIS — J45909 Unspecified asthma, uncomplicated: Secondary | ICD-10-CM | POA: Insufficient documentation

## 2014-10-17 DIAGNOSIS — M25512 Pain in left shoulder: Secondary | ICD-10-CM | POA: Insufficient documentation

## 2014-10-17 DIAGNOSIS — Z87440 Personal history of urinary (tract) infections: Secondary | ICD-10-CM | POA: Insufficient documentation

## 2014-10-17 DIAGNOSIS — Z8719 Personal history of other diseases of the digestive system: Secondary | ICD-10-CM | POA: Diagnosis not present

## 2014-10-17 DIAGNOSIS — R200 Anesthesia of skin: Secondary | ICD-10-CM | POA: Diagnosis not present

## 2014-10-17 DIAGNOSIS — Z791 Long term (current) use of non-steroidal anti-inflammatories (NSAID): Secondary | ICD-10-CM | POA: Insufficient documentation

## 2014-10-17 DIAGNOSIS — M542 Cervicalgia: Secondary | ICD-10-CM | POA: Diagnosis present

## 2014-10-17 DIAGNOSIS — Z720 Tobacco use: Secondary | ICD-10-CM | POA: Diagnosis not present

## 2014-10-17 DIAGNOSIS — M546 Pain in thoracic spine: Secondary | ICD-10-CM | POA: Diagnosis not present

## 2014-10-17 DIAGNOSIS — M549 Dorsalgia, unspecified: Secondary | ICD-10-CM

## 2014-10-17 DIAGNOSIS — Z79899 Other long term (current) drug therapy: Secondary | ICD-10-CM | POA: Diagnosis not present

## 2014-10-17 LAB — CBC
HEMATOCRIT: 31.9 % — AB (ref 36.0–46.0)
HEMOGLOBIN: 10.2 g/dL — AB (ref 12.0–15.0)
MCH: 27.1 pg (ref 26.0–34.0)
MCHC: 32 g/dL (ref 30.0–36.0)
MCV: 84.8 fL (ref 78.0–100.0)
Platelets: 394 10*3/uL (ref 150–400)
RBC: 3.76 MIL/uL — AB (ref 3.87–5.11)
RDW: 14.7 % (ref 11.5–15.5)
WBC: 8 10*3/uL (ref 4.0–10.5)

## 2014-10-17 LAB — I-STAT CHEM 8, ED
BUN: 8 mg/dL (ref 6–23)
Calcium, Ion: 1.16 mmol/L (ref 1.12–1.23)
Chloride: 105 mmol/L (ref 96–112)
Creatinine, Ser: 0.7 mg/dL (ref 0.50–1.10)
Glucose, Bld: 102 mg/dL — ABNORMAL HIGH (ref 70–99)
HCT: 32 % — ABNORMAL LOW (ref 36.0–46.0)
Hemoglobin: 10.9 g/dL — ABNORMAL LOW (ref 12.0–15.0)
Potassium: 3.6 mmol/L (ref 3.5–5.1)
SODIUM: 142 mmol/L (ref 135–145)
TCO2: 23 mmol/L (ref 0–100)

## 2014-10-17 NOTE — ED Notes (Signed)
Pt presents with pain in her neck that radiates into left arm and into her chest for the past 8 days- admits to some nausea.  Denies injury to neck or arm.  Denies SOB.

## 2014-10-18 LAB — BASIC METABOLIC PANEL
ANION GAP: 6 (ref 5–15)
BUN: 9 mg/dL (ref 6–23)
CO2: 28 mmol/L (ref 19–32)
CREATININE: 0.69 mg/dL (ref 0.50–1.10)
Calcium: 9 mg/dL (ref 8.4–10.5)
Chloride: 105 mmol/L (ref 96–112)
Glucose, Bld: 100 mg/dL — ABNORMAL HIGH (ref 70–99)
Potassium: 3.6 mmol/L (ref 3.5–5.1)
SODIUM: 139 mmol/L (ref 135–145)

## 2014-10-18 LAB — LIPASE, BLOOD: Lipase: 31 U/L (ref 11–59)

## 2014-10-18 LAB — HEPATIC FUNCTION PANEL
ALBUMIN: 3.7 g/dL (ref 3.5–5.2)
ALK PHOS: 87 U/L (ref 39–117)
ALT: 13 U/L (ref 0–35)
AST: 15 U/L (ref 0–37)
Bilirubin, Direct: <0.1 mg/dL (ref 0.0–0.5)
TOTAL PROTEIN: 6.5 g/dL (ref 6.0–8.3)
Total Bilirubin: 0.4 mg/dL (ref 0.3–1.2)

## 2014-10-18 LAB — I-STAT TROPONIN, ED: Troponin i, poc: 0 ng/mL (ref 0.00–0.08)

## 2014-10-18 MED ORDER — TRAMADOL HCL 50 MG PO TABS: 50.0000 mg | ORAL_TABLET | Freq: Four times a day (QID) | ORAL | Status: DC | PRN

## 2014-10-18 MED ORDER — DIAZEPAM 5 MG/ML IJ SOLN
5.0000 mg | Freq: Once | INTRAMUSCULAR | Status: AC
  Administered 2014-10-18: 5 mg via INTRAVENOUS
  Filled 2014-10-18: qty 2

## 2014-10-18 MED ORDER — NAPROXEN 500 MG PO TABS: 500.0000 mg | ORAL_TABLET | Freq: Two times a day (BID) | ORAL | Status: DC

## 2014-10-18 MED ORDER — KETOROLAC TROMETHAMINE 30 MG/ML IJ SOLN: 30.0000 mg | Freq: Once | INTRAMUSCULAR | Status: DC

## 2014-10-18 MED ORDER — METHOCARBAMOL 500 MG PO TABS: 500.0000 mg | ORAL_TABLET | Freq: Two times a day (BID) | ORAL | Status: DC

## 2014-10-18 MED ORDER — KETOROLAC TROMETHAMINE 30 MG/ML IJ SOLN
30.0000 mg | Freq: Once | INTRAMUSCULAR | Status: AC
  Administered 2014-10-18: 30 mg via INTRAVENOUS
  Filled 2014-10-18: qty 1

## 2014-10-18 NOTE — Discharge Instructions (Signed)
Return to the Emergency Department if you develop uncontrollable vomiting, abdominal pain, chest pain, or shortness of breath.  Take medications as prescribed.  Do not drive or operate heavy machinery for 4-6 hours after taking Robaxin and Ultram.

## 2014-10-18 NOTE — ED Provider Notes (Signed)
CSN: [id number]     Arrival date & time 10/17/14  2315 History   First MD Initiated Contact with Patient 10/17/14 2354     Chief Complaint  Patient presents with  . Neck Pain     (Consider location/radiation/quality/duration/timing/severity/associated sxs/prior Treatment) HPI Comments: Patient presents today with a chief complaint of pain of the left side of her neck and the left arm.  She states that the pain has been present for the past 8 days and is gradually worsening.  Pain worse with movement and lifting.  Pain became worse this evening after lifting her daughter.  She states that she gave birth to twins on month ago and has been lifting them frequently.  She has taken Ibuprofen and Tylenol for the pain without relief.  She reports occasional numbness/tingling of the left 1st and 2nd fingers of the left hand.  She denies chest pain, SOB, cough, hemoptysis, abdominal pain.  She states that she had one episode of vomiting and burning in her chest this morning after eating something spicy.  No nausea, vomiting, or chest pain at this time.   Patient is a 29 y.o. female presenting with neck pain. The history is provided by the patient.  Neck Pain   Past Medical History  Diagnosis Date  . Asthma   . Headache(784.0)     migraines  . Depression   . PIH (pregnancy induced hypertension)   . Late prenatal care     22wks  . Anemia 2007  . HPV (human papilloma virus) anogenital infection   . Recurrent UTI   . Abnormal Pap smear   . Chlamydia infection     has no taken treatment as of 01/03/2013  . SVD (spontaneous vaginal delivery) 01/08/2013  . Vaginal Pap smear, abnormal   . Twin pregnancy in third trimester 08/08/2014  . Pancreatitis, acute 08/08/2014  . Pregnancy induced hypertension   . Acute gallstone pancreatitis 08/30/2014   Past Surgical History  Procedure Laterality Date  . No past surgeries    . Vaginal delivery N/A 09/18/2014    Procedure: VAGINAL DELIVERY;  Surgeon: Lavina Hammanodd  Meisinger, MD;  Location: [facility];  Service: Obstetrics;  Laterality: N/A;   Family History  Problem Relation Age of Onset  . Anesthesia problems Neg Hx   . Hypotension Neg Hx   . Malignant hyperthermia Neg Hx   . Pseudochol deficiency Neg Hx   . Heart disease Maternal Grandmother     clots in heart  . Stroke Maternal Grandmother   . Diabetes Mother   . Heart disease Maternal Grandfather     pacemaker  . Miscarriages / Stillbirths Paternal Aunt   . Stroke Paternal Grandmother    History  Substance Use Topics  . Smoking status: Current Every Day Smoker -- 0.25 packs/day for 5 years    Types: Cigarettes  . Smokeless tobacco: Never Used  . Alcohol Use: No   OB History    Gravida Para Term Preterm AB TAB SAB Ectopic Multiple Living   6 4 3 1 2  0 2 0 1 5     Review of Systems  Musculoskeletal: Positive for neck pain.  All other systems reviewed and are negative.     Allergies  Corn syrup  Home Medications   Prior to Admission medications   Medication Sig Start Date End Date Taking? Authorizing Provider  acetaminophen (TYLENOL) 500 MG tablet Take 1,000 mg by mouth every 6 (six) hours as needed for mild pain or headache.  Historical Provider, MD  albuterol (PROVENTIL HFA;VENTOLIN HFA) 108 (90 BASE) MCG/ACT inhaler Inhale 2 puffs into the lungs every 6 (six) hours as needed for wheezing or shortness of breath.    Historical Provider, MD  busPIRone (BUSPAR) 5 MG tablet Take 5 mg by mouth 3 (three) times daily.    Historical Provider, MD  chlorhexidine (PERIDEX) 0.12 % solution Swish and spit 10 mLs every morning.  04/24/14   Historical Provider, MD  diphenhydrAMINE (BENADRYL) 25 MG tablet Take 25 mg by mouth every 6 (six) hours as needed for itching or allergies (allergies).     Historical Provider, MD  ferrous sulfate 325 (65 FE) MG tablet Take 1 tablet (325 mg total) by mouth daily with breakfast. Patient taking differently: Take 325 mg by mouth 3 (three) times daily with  meals.  08/10/14   Oliver Pila, MD  ibuprofen (ADVIL,MOTRIN) 600 MG tablet Take 1 tablet (600 mg total) by mouth every 6 (six) hours. 09/20/14   Lavina Hamman, MD  oxyCODONE-acetaminophen (PERCOCET/ROXICET) 5-325 MG per tablet Take 1-2 tablets by mouth every 4 (four) hours as needed (for pain scale less than 7). 09/20/14   Lavina Hamman, MD  Prenatal Vit-Fe Fumarate-FA (PRENATAL MULTIVITAMIN) TABS tablet Take 1 tablet by mouth 2 (two) times daily.    Historical Provider, MD  sertraline (ZOLOFT) 25 MG tablet Take 25 mg by mouth daily.    Historical Provider, MD   BP 145/90 mmHg  Pulse 68  Temp(Src) 98.1 F (36.7 C) (Oral)  Resp 16  Ht  (1.702 m)  Wt 195 lb (88.451 kg)  BMI 30.53 kg/m2  SpO2 100% Physical Exam  Constitutional: She appears well-developed and well-nourished.  HENT:  Head: Normocephalic and atraumatic.  Mouth/Throat: Oropharynx is clear and moist.  Neck: Normal range of motion. Neck supple.  Cardiovascular: Normal rate, regular rhythm and normal heart sounds.   Pulses:      Radial pulses are 2+ on the right side, and 2+ on the left side.  Pulmonary/Chest: Effort normal and breath sounds normal. No respiratory distress. She has no wheezes. She has no rales.  Abdominal: Soft. Bowel sounds are normal. She exhibits no distension and no mass. There is no tenderness. There is no rebound and no guarding.  Musculoskeletal: Normal range of motion.  Pain with ROM of the left arm.  No erythema, edema, or warmth of the left arm Tenderness to palpation of the left trapezius  Neurological: She is alert.  Grip strength 5/5 bilaterally Distal sensation of all fingers intact bilaterally  Skin: Skin is warm and dry.  Psychiatric: She has a normal mood and affect.  Nursing note and vitals reviewed.   ED Course  Procedures (including critical care time) Labs Review Labs Reviewed  CBC - Abnormal; Notable for the following:    RBC 3.76 (*)    Hemoglobin 10.2 (*)    HCT  31.9 (*)    All other components within normal limits  I-STAT CHEM 8, ED - Abnormal; Notable for the following:    Glucose, Bld 102 (*)    Hemoglobin 10.9 (*)    HCT 32.0 (*)    All other components within normal limits  BASIC METABOLIC PANEL  LIPASE, BLOOD  I-STAT TROPOININ, ED    Imaging Review No results found.   EKG Interpretation   Date/Time:  Thursday October 17 2014 23:41:15 EST Ventricular Rate:  69 PR Interval:  148 QRS Duration: 86 QT Interval:  414 QTC Calculation: 443 R Axis:  2 Text Interpretation:  Normal sinus rhythm Normal ECG No significant change  since last tracing Confirmed by Erroll Luna [phone]) on 10/17/2014  11:52:55 PM     1:22 AM Reassessed patient.  Pain has improved after given Toradol and Valium. MDM   Final diagnoses:  None   Patient presents today with pain of her left arm.  Pain worse with movement.  Patient also tender along the left trapezius.  She is neurovascularly intact.  No signs of infection or DVT.  Pain improved after Valium and Toradol.  Patient has one month old twins at home and has done a lot of frequent lifting.  Suspect that the pain is musculoskeletal.  Labs unremarkable.  No ischemic changes on EKG.  VSS.  Patient stable for discharge.  Return precautions given.    Santiago Glad, PA-C 10/20/14 1255  Tomasita Crumble, MD 10/20/14 1415

## 2014-11-16 ENCOUNTER — Emergency Department (HOSPITAL_COMMUNITY)
Admission: EM | Admit: 2014-11-16 | Discharge: 2014-11-16 | Disposition: A | Discharge status: Home / Self Care | Payer: Medicaid Other | Attending: Emergency Medicine | Admitting: Emergency Medicine

## 2014-11-16 ENCOUNTER — Encounter (HOSPITAL_COMMUNITY): Payer: Self-pay | Admitting: Nurse Practitioner

## 2014-11-16 DIAGNOSIS — J45909 Unspecified asthma, uncomplicated: Secondary | ICD-10-CM | POA: Insufficient documentation

## 2014-11-16 DIAGNOSIS — Z791 Long term (current) use of non-steroidal anti-inflammatories (NSAID): Secondary | ICD-10-CM | POA: Diagnosis not present

## 2014-11-16 DIAGNOSIS — K0889 Other specified disorders of teeth and supporting structures: Secondary | ICD-10-CM

## 2014-11-16 DIAGNOSIS — Z8619 Personal history of other infectious and parasitic diseases: Secondary | ICD-10-CM | POA: Diagnosis not present

## 2014-11-16 DIAGNOSIS — F329 Major depressive disorder, single episode, unspecified: Secondary | ICD-10-CM | POA: Diagnosis not present

## 2014-11-16 DIAGNOSIS — Z79899 Other long term (current) drug therapy: Secondary | ICD-10-CM | POA: Diagnosis not present

## 2014-11-16 DIAGNOSIS — K088 Other specified disorders of teeth and supporting structures: Secondary | ICD-10-CM | POA: Diagnosis not present

## 2014-11-16 DIAGNOSIS — Z87440 Personal history of urinary (tract) infections: Secondary | ICD-10-CM | POA: Diagnosis not present

## 2014-11-16 DIAGNOSIS — Z862 Personal history of diseases of the blood and blood-forming organs and certain disorders involving the immune mechanism: Secondary | ICD-10-CM | POA: Diagnosis not present

## 2014-11-16 DIAGNOSIS — Z720 Tobacco use: Secondary | ICD-10-CM | POA: Diagnosis not present

## 2014-11-16 MED ORDER — HYDROCODONE-ACETAMINOPHEN 5-325 MG PO TABS: 1.0000 | ORAL_TABLET | Freq: Four times a day (QID) | ORAL | Status: DC | PRN

## 2014-11-16 NOTE — Discharge Instructions (Signed)
°  Read the information below.  Use the prescribed medication as directed.  Please discuss all new medications with your pharmacist.  Do not take additional tylenol while taking the prescribed pain medication to avoid overdose.  You may return to the Emergency Department at any time for worsening condition or any new symptoms that concern you.   If there is any possibility that you might be pregnant or you are breastfeeding, please let your health care provider know and discuss this with the pharmacist to ensure medication safety.  Please follow up with your oral surgeon on Monday.  If you develop fevers, swelling in your face, difficulty swallowing or breathing, return to the ER immediately for a recheck.     Dental Pain Toothache is pain in or around a tooth. It may get worse with chewing or with cold or heat.  HOME CARE  Your dentist may use a numbing medicine during treatment. If so, you may need to avoid eating until the medicine wears off. Ask your dentist about this.  Only take medicine as told by your dentist or doctor.  Avoid chewing food near the painful tooth until after all treatment is done. Ask your dentist about this. GET HELP RIGHT AWAY IF:   The problem gets worse or new problems appear.  You have a fever.  There is redness and puffiness (swelling) of the face, jaw, or neck.  You cannot open your mouth.  There is pain in the jaw.  There is very bad pain that is not helped by medicine. MAKE SURE YOU:   Understand these instructions.  Will watch your condition.  Will get help right away if you are not doing well or get worse. Document Released: 01/19/2008 Document Revised: 10/25/2011 Document Reviewed: 01/19/2008 [facility] Patient Information 2015 [employer], [place]. This information is not intended to replace advice given to you by your health care provider. Make sure you discuss any questions you have with your health care provider.

## 2014-11-16 NOTE — ED Notes (Signed)
She states she chipped her L lower back tooth and has been having pain since

## 2014-11-16 NOTE — ED Provider Notes (Signed)
CSN: [id number]     Arrival date & time 11/16/14  1300 History  This chart is scribed for non-physician practitioner, Trixie Dredge, PA-C, working with Mirian Mo, MD by Abel Presto, ED Scribe.  This patient was seen in room TR10C/TR10C and the patient's care was started 2:41 PM.      Chief Complaint  Patient presents with  . Dental Pain    Patient is a 29 y.o. female presenting with tooth pain. The history is provided by the patient. No language interpreter was used.  Dental Pain Associated symptoms: no fever    HPI Comments: [name] is a 29 y.o. female who presents to the Emergency Department complaining of left lower dental pain. Pt cracked her tooth a week ago. Pt saw her dentist this week and was given ibuprofen and amoxicillin. Pt has an appointment to have tooth removed next week but pain is uncontrolled.  Pt denies fever, chills, body aches, difficulty swallowing or breathing.   Past Medical History  Diagnosis Date  . Asthma   . Headache(784.0)     migraines  . Depression   . PIH (pregnancy induced hypertension)   . Late prenatal care     22wks  . Anemia 2007  . HPV (human papilloma virus) anogenital infection   . Recurrent UTI   . Abnormal Pap smear   . Chlamydia infection     has no taken treatment as of 01/03/2013  . SVD (spontaneous vaginal delivery) 01/08/2013  . Vaginal Pap smear, abnormal   . Twin pregnancy in third trimester 08/08/2014  . Pancreatitis, acute 08/08/2014  . Pregnancy induced hypertension   . Acute gallstone pancreatitis 08/30/2014   Past Surgical History  Procedure Laterality Date  . No past surgeries    . Vaginal delivery N/A 09/18/2014    Procedure: VAGINAL DELIVERY;  Surgeon: Lavina Hamman, MD;  Location: [facility];  Service: Obstetrics;  Laterality: N/A;   Family History  Problem Relation Age of Onset  . Anesthesia problems Neg Hx   . Hypotension Neg Hx   . Malignant hyperthermia Neg Hx   . Pseudochol deficiency Neg Hx   . Heart  disease Maternal Grandmother     clots in heart  . Stroke Maternal Grandmother   . Diabetes Mother   . Heart disease Maternal Grandfather     pacemaker  . Miscarriages / Stillbirths Paternal Aunt   . Stroke Paternal Grandmother    History  Substance Use Topics  . Smoking status: Current Every Day Smoker -- 0.25 packs/day for 5 years    Types: Cigarettes  . Smokeless tobacco: Never Used  . Alcohol Use: No   OB History    Gravida Para Term Preterm AB TAB SAB Ectopic Multiple Living   0 2 0 1 5     Review of Systems  Constitutional: Negative for fever and chills.  HENT: Positive for dental problem. Negative for sore throat and trouble swallowing.   Respiratory: Negative for shortness of breath.   Musculoskeletal: Negative for myalgias and neck stiffness.  Skin: Negative for color change and wound.  Allergic/Immunologic: Negative for immunocompromised state.  Hematological: Does not bruise/bleed easily.  Psychiatric/Behavioral: Negative for self-injury.      Allergies  Corn syrup  Home Medications   Prior to Admission medications   Medication Sig Start Date End Date Taking? Authorizing Provider  acetaminophen (TYLENOL) 500 MG tablet Take 1,000 mg by mouth every 6 (six) hours as needed for mild pain  or headache.    Historical Provider, MD  albuterol (PROVENTIL HFA;VENTOLIN HFA) 108 (90 BASE) MCG/ACT inhaler Inhale 2 puffs into the lungs every 6 (six) hours as needed for wheezing or shortness of breath.    Historical Provider, MD  busPIRone (BUSPAR) 5 MG tablet Take 5 mg by mouth 2 (two) times daily.     Historical Provider, MD  chlorhexidine (PERIDEX) 0.12 % solution Swish and spit 10 mLs every morning.  04/24/14   Historical Provider, MD  diphenhydrAMINE (BENADRYL) 25 MG tablet Take 25 mg by mouth every 6 (six) hours as needed for itching or allergies (allergies).     Historical Provider, MD  ferrous sulfate 325 (65 FE) MG tablet Take 1 tablet (325 mg total) by mouth  daily with breakfast. Patient taking differently: Take 325 mg by mouth 3 (three) times daily with meals.  08/10/14   Huel CoteKathy Richardson, MD  ibuprofen (ADVIL,MOTRIN) 200 MG tablet Take 400 mg by mouth every 6 (six) hours as needed for moderate pain.    Historical Provider, MD  ibuprofen (ADVIL,MOTRIN) 600 MG tablet Take 1 tablet (600 mg total) by mouth every 6 (six) hours. Patient not taking: Reported on 10/18/2014 09/20/14   Lavina Hammanodd Meisinger, MD  methocarbamol (ROBAXIN) 500 MG tablet Take 1 tablet (500 mg total) by mouth 2 (two) times daily. 10/18/14   Heather Laisure, PA-C  naproxen (NAPROSYN) 500 MG tablet Take 1 tablet (500 mg total) by mouth 2 (two) times daily. 10/18/14   Santiago GladHeather Laisure, PA-C  oxyCODONE-acetaminophen (PERCOCET/ROXICET) 5-325 MG per tablet Take 1-2 tablets by mouth every 4 (four) hours as needed (for pain scale less than 7). 09/20/14   Lavina Hammanodd Meisinger, MD  Prenatal Vit-Fe Fumarate-FA (PRENATAL MULTIVITAMIN) TABS tablet Take 1 tablet by mouth daily.     Historical Provider, MD  sertraline (ZOLOFT) 25 MG tablet Take 25 mg by mouth daily.    Historical Provider, MD  traMADol (ULTRAM) 50 MG tablet Take 1 tablet (50 mg total) by mouth every 6 (six) hours as needed. 10/18/14   Heather Laisure, PA-C   BP 123/60 mmHg  Pulse 99  Temp(Src) 98 F (36.7 C) (Oral)  Resp 18  SpO2 100% Physical Exam  Constitutional: Vital signs are normal. She appears well-developed and well-nourished.  Non-toxic appearance. She does not have a sickly appearance. No distress.  HENT:  Head: Normocephalic and atraumatic.  Mouth/Throat: Uvula is midline and oropharynx is clear and moist. Mucous membranes are not dry. No uvula swelling. No oropharyngeal exudate, posterior oropharyngeal edema, posterior oropharyngeal erythema or tonsillar abscesses.  Left lower second molar with remote fracture; tender to percussion; no trismus  Neck: Trachea normal, normal range of motion and phonation normal. Neck supple. No tracheal  tenderness present. No rigidity. No tracheal deviation, no edema, no erythema and normal range of motion present.  Cardiovascular: Normal rate.   Pulmonary/Chest: Effort normal and breath sounds normal. No stridor.  Lymphadenopathy:       Head (right side): No submental and no submandibular adenopathy present.       Head (left side): No submental and no submandibular adenopathy present.    She has no cervical adenopathy.  Neurological: She is alert.  Skin: She is not diaphoretic.  Nursing note and vitals reviewed.   ED Course  Procedures (including critical care time) DIAGNOSTIC STUDIES: Oxygen Saturation is 100% on room air normal by my interpretation.    COORDINATION OF CARE: 2:44 PM Discussed treatment plan with patient at beside, the patient agrees with the  plan and has no further questions at this time.    Labs Review Labs Reviewed - No data to display  Imaging Review No results found.   EKG Interpretation None        MDM   Final diagnoses:  Pain, dental   Afebrile, nontoxic patient with new dental pain.  No obvious abscess.  No concerning findings on exam.  Doubt deep space head or neck infection.  Doubt Ludwig's angina.  D/C home with continued antibiotic, pain medication and dental follow up.  Pt states she has help at night with her babies and can take a medication that might be sedating.  She is not breastfeeding.  Discussed findings, treatment, and follow up  with patient.  Pt given return precautions.  Pt verbalizes understanding and agrees with plan.       I personally performed the services described in this documentation, which was scribed in my presence. The recorded information has been reviewed and is accurate.    Trixie Dredge, PA-C 11/16/14 1641  Mirian Mo, MD 11/19/14 [phone]

## 2014-11-29 ENCOUNTER — Encounter (HOSPITAL_COMMUNITY): Payer: Self-pay | Admitting: *Deleted

## 2014-11-29 ENCOUNTER — Emergency Department (HOSPITAL_COMMUNITY)
Admission: EM | Admit: 2014-11-29 | Discharge: 2014-11-29 | Disposition: A | Discharge status: Home / Self Care | Payer: Medicaid Other | Attending: Emergency Medicine | Admitting: Emergency Medicine

## 2014-11-29 DIAGNOSIS — J45909 Unspecified asthma, uncomplicated: Secondary | ICD-10-CM | POA: Insufficient documentation

## 2014-11-29 DIAGNOSIS — K088 Other specified disorders of teeth and supporting structures: Secondary | ICD-10-CM | POA: Insufficient documentation

## 2014-11-29 DIAGNOSIS — Z87440 Personal history of urinary (tract) infections: Secondary | ICD-10-CM | POA: Diagnosis not present

## 2014-11-29 DIAGNOSIS — Z791 Long term (current) use of non-steroidal anti-inflammatories (NSAID): Secondary | ICD-10-CM | POA: Diagnosis not present

## 2014-11-29 DIAGNOSIS — Z8679 Personal history of other diseases of the circulatory system: Secondary | ICD-10-CM | POA: Insufficient documentation

## 2014-11-29 DIAGNOSIS — Z720 Tobacco use: Secondary | ICD-10-CM | POA: Diagnosis not present

## 2014-11-29 DIAGNOSIS — Z79899 Other long term (current) drug therapy: Secondary | ICD-10-CM | POA: Diagnosis not present

## 2014-11-29 DIAGNOSIS — L03011 Cellulitis of right finger: Secondary | ICD-10-CM | POA: Diagnosis not present

## 2014-11-29 DIAGNOSIS — Z8719 Personal history of other diseases of the digestive system: Secondary | ICD-10-CM | POA: Insufficient documentation

## 2014-11-29 DIAGNOSIS — Z8619 Personal history of other infectious and parasitic diseases: Secondary | ICD-10-CM | POA: Insufficient documentation

## 2014-11-29 DIAGNOSIS — D649 Anemia, unspecified: Secondary | ICD-10-CM | POA: Diagnosis not present

## 2014-11-29 DIAGNOSIS — K0889 Other specified disorders of teeth and supporting structures: Secondary | ICD-10-CM

## 2014-11-29 MED ORDER — HYDROCODONE-ACETAMINOPHEN 5-325 MG PO TABS: 1.0000 | ORAL_TABLET | Freq: Four times a day (QID) | ORAL | Status: DC | PRN

## 2014-11-29 MED ORDER — LIDOCAINE HCL (PF) 1 % IJ SOLN
5.0000 mL | Freq: Once | INTRAMUSCULAR | Status: AC
  Administered 2014-11-29: 5 mL via INTRADERMAL
  Filled 2014-11-29: qty 5

## 2014-11-29 MED ORDER — CLINDAMYCIN HCL 150 MG PO CAPS: 450.0000 mg | ORAL_CAPSULE | Freq: Three times a day (TID) | ORAL | Status: DC

## 2014-11-29 NOTE — ED Notes (Signed)
Pt seen here 4/2 for dental pain. Had tooth extracted 10 days ago. Started having pain in socket yesterday. Facial swelling today. ALSO- c/o pain right thumb.Marland Kitchen.appears to have infected cuticle.

## 2014-11-29 NOTE — ED Provider Notes (Signed)
CSN: [id number]     Arrival date & time 11/29/14  1354 History   First MD Initiated Contact with Patient 11/29/14 1419     Chief Complaint  Patient presents with  . Dental Pain  . Finger Injury    The history is provided by the patient. No language interpreter was used.   This chart was scribed for non-physician practitioner Mackenzie GladHeather Phyllis Abelson, PA-C, working with Mackenzie DivineJohn Wofford, MD, by [name], ED Scribe. This patient was seen in room TR04C/TR04C and the patient's care was started at 2:22 PM.  Mackenzie CollardBrittany L [name] is a 29 y.o. female who presents to the Emergency Department complaining of dental pain that began 9 days ago. Pt states she had tooth extraction 9 days ago by oral surgeon Dr. Teola [name]. She was prescribed Hydrocodone but was not prescribed abx. Pt states she woke up this morning with worsening dental pain along with facial swelling. She also reports feeling feverish with chills, but did not check her temperature. Pt has tried Ibuprofen and Advil for the pain without relief. She denies difficulty swallowing.   Pt also c/o of right thumb nail pain. Pt reports redness, swelling and some pus drainage to right thumb. She applied peroxide and neosporin and a band aide to area without relief.  She denies biting her nails or any trauma or injury to the area.     Past Medical History  Diagnosis Date  . Asthma   . Headache(784.0)     migraines  . Depression   . PIH (pregnancy induced hypertension)   . Late prenatal care     22wks  . Anemia 2007  . HPV (human papilloma virus) anogenital infection   . Recurrent UTI   . Abnormal Pap smear   . Chlamydia infection     has no taken treatment as of 01/03/2013  . SVD (spontaneous vaginal delivery) 01/08/2013  . Vaginal Pap smear, abnormal   . Twin pregnancy in third trimester 08/08/2014  . Pancreatitis, acute 08/08/2014  . Pregnancy induced hypertension   . Acute gallstone pancreatitis 08/30/2014   Past Surgical History  Procedure Laterality  Date  . No past surgeries    . Vaginal delivery N/A 09/18/2014    Procedure: VAGINAL DELIVERY;  Surgeon: Mackenzie Hammanodd Meisinger, MD;  Location: [facility];  Service: Obstetrics;  Laterality: N/A;   Family History  Problem Relation Age of Onset  . Anesthesia problems Neg Hx   . Hypotension Neg Hx   . Malignant hyperthermia Neg Hx   . Pseudochol deficiency Neg Hx   . Heart disease Maternal Grandmother     clots in heart  . Stroke Maternal Grandmother   . Diabetes Mother   . Heart disease Maternal Grandfather     pacemaker  . Miscarriages / Stillbirths Paternal Aunt   . Stroke Paternal Grandmother    History  Substance Use Topics  . Smoking status: Current Every Day Smoker -- 0.25 packs/day for 5 years    Types: Cigarettes  . Smokeless tobacco: Never Used  . Alcohol Use: No   OB History    Gravida Para Term Preterm AB TAB SAB Ectopic Multiple Living   6 4 3 1 2  0 2 0 1 5     Review of Systems  Constitutional: Positive for chills.  HENT: Positive for dental problem and facial swelling. Negative for trouble swallowing.   Skin: Positive for color change.   Allergies  Corn syrup and Tramadol  Home Medications   Prior to Admission medications  Medication Sig Start Date End Date Taking? Authorizing Provider  acetaminophen (TYLENOL) 500 MG tablet Take 1,000 mg by mouth every 6 (six) hours as needed for mild pain or headache.    Historical Provider, MD  albuterol (PROVENTIL HFA;VENTOLIN HFA) 108 (90 BASE) MCG/ACT inhaler Inhale 2 puffs into the lungs every 6 (six) hours as needed for wheezing or shortness of breath.    Historical Provider, MD  busPIRone (BUSPAR) 5 MG tablet Take 5 mg by mouth 2 (two) times daily.     Historical Provider, MD  chlorhexidine (PERIDEX) 0.12 % solution Swish and spit 10 mLs every morning.  04/24/14   Historical Provider, MD  diphenhydrAMINE (BENADRYL) 25 MG tablet Take 25 mg by mouth every 6 (six) hours as needed for itching or allergies (allergies).     Historical  Provider, MD  ferrous sulfate 325 (65 FE) MG tablet Take 1 tablet (325 mg total) by mouth daily with breakfast. Patient taking differently: Take 325 mg by mouth 3 (three) times daily with meals.  08/10/14   Huel Cote, MD  HYDROcodone-acetaminophen (NORCO/VICODIN) 5-325 MG per tablet Take 1 tablet by mouth every 6 (six) hours as needed for moderate pain or severe pain. 11/16/14   Mackenzie Dredge, PA-C  ibuprofen (ADVIL,MOTRIN) 200 MG tablet Take 400 mg by mouth every 6 (six) hours as needed for moderate pain.    Historical Provider, MD  ibuprofen (ADVIL,MOTRIN) 600 MG tablet Take 1 tablet (600 mg total) by mouth every 6 (six) hours. Patient not taking: Reported on 10/18/2014 09/20/14   Mackenzie Hamman, MD  methocarbamol (ROBAXIN) 500 MG tablet Take 1 tablet (500 mg total) by mouth 2 (two) times daily. 10/18/14   Mackenzie Goering, PA-C  naproxen (NAPROSYN) 500 MG tablet Take 1 tablet (500 mg total) by mouth 2 (two) times daily. 10/18/14   Mackenzie Glad, PA-C  oxyCODONE-acetaminophen (PERCOCET/ROXICET) 5-325 MG per tablet Take 1-2 tablets by mouth every 4 (four) hours as needed (for pain scale less than 7). 09/20/14   Mackenzie Hamman, MD  Prenatal Vit-Fe Fumarate-FA (PRENATAL MULTIVITAMIN) TABS tablet Take 1 tablet by mouth daily.     Historical Provider, MD  sertraline (ZOLOFT) 25 MG tablet Take 25 mg by mouth daily.    Historical Provider, MD  traMADol (ULTRAM) 50 MG tablet Take 1 tablet (50 mg total) by mouth every 6 (six) hours as needed. 10/18/14   Mackenzie Casad, PA-C   BP 144/81 mmHg  Pulse 92  Temp(Src) 98.7 F (37.1 C) (Oral)  Resp 16  SpO2 100% Physical Exam  Constitutional: She is oriented to person, place, and time. She appears well-developed and well-nourished. No distress.  HENT:  Head: Normocephalic and atraumatic.  Mouth/Throat: Uvula is midline and oropharynx is clear and moist. No trismus in the jaw.  No sublingual tenderness or swelling. ttp of the left lower molar to the site of  previous extraction no drainage.   Eyes: Conjunctivae and EOM are normal.  Neck: Normal range of motion. Neck supple.  Cardiovascular: Normal rate, regular rhythm and normal heart sounds.   Pulmonary/Chest: Effort normal and breath sounds normal.  Musculoskeletal: Normal range of motion.  Lymphadenopathy:       Head (right side): No submental and no submandibular adenopathy present.       Head (left side): No submental and no submandibular adenopathy present.  Neurological: She is alert and oriented to person, place, and time.  Skin: Skin is warm and dry.  TTP and erythema of ulnar aspect of right thumb  finger nail with mild fluctuance of the area.   Psychiatric: She has a normal mood and affect. Her behavior is normal.  Nursing note and vitals reviewed.   ED Course  Procedures (including critical care time) DIAGNOSTIC STUDIES: Oxygen Saturation is 100% on RA, normal by my interpretation.    COORDINATION OF CARE: 2:39 PM- Pt advised of plan for treatment and pt agrees.  Labs Review Labs Reviewed - No data to display  Imaging Review No results found.   EKG Interpretation None      MDM   Final diagnoses:  None   Patient presents today with a Paronychia of her right thumb.  Mild cellulitis of the area.  No felon present.  Paronychia incised and drained in the ED and patient started on antibiotics.  Patient also with left lower dental pain at the site of where a tooth was extracted 9 days ago.  No signs of infection at this time.  No gross abscess.  Exam unconcerning for Ludwig's angina or spread of infection.  Patient instructed to follow up with the Oral Surgeon that extracted the tooth.Mackenzie Glad, PA-C 11/29/14 1652  Mackenzie Divine, MD 11/29/14 [phone]

## 2014-11-29 NOTE — ED Notes (Signed)
Pt reports pain and swelling to right thumb, unsure of any injury. Also has pain to left lower tooth. Airway intact.

## 2014-12-12 ENCOUNTER — Encounter (HOSPITAL_COMMUNITY): Payer: Self-pay | Admitting: *Deleted

## 2014-12-12 ENCOUNTER — Inpatient Hospital Stay (HOSPITAL_COMMUNITY)
Admission: EM | Admit: 2014-12-12 | Discharge: 2014-12-14 | DRG: 419 | Disposition: A | Discharge status: Home / Self Care | Payer: Medicaid Other | Attending: Surgery | Admitting: Surgery

## 2014-12-12 DIAGNOSIS — F329 Major depressive disorder, single episode, unspecified: Secondary | ICD-10-CM | POA: Diagnosis present

## 2014-12-12 DIAGNOSIS — Z888 Allergy status to other drugs, medicaments and biological substances status: Secondary | ICD-10-CM

## 2014-12-12 DIAGNOSIS — K8020 Calculus of gallbladder without cholecystitis without obstruction: Secondary | ICD-10-CM

## 2014-12-12 DIAGNOSIS — K810 Acute cholecystitis: Secondary | ICD-10-CM | POA: Diagnosis present

## 2014-12-12 DIAGNOSIS — K8000 Calculus of gallbladder with acute cholecystitis without obstruction: Principal | ICD-10-CM | POA: Diagnosis present

## 2014-12-12 DIAGNOSIS — R109 Unspecified abdominal pain: Secondary | ICD-10-CM

## 2014-12-12 DIAGNOSIS — G43909 Migraine, unspecified, not intractable, without status migrainosus: Secondary | ICD-10-CM | POA: Diagnosis present

## 2014-12-12 DIAGNOSIS — K829 Disease of gallbladder, unspecified: Secondary | ICD-10-CM

## 2014-12-12 DIAGNOSIS — F17210 Nicotine dependence, cigarettes, uncomplicated: Secondary | ICD-10-CM | POA: Diagnosis present

## 2014-12-12 DIAGNOSIS — Z87440 Personal history of urinary (tract) infections: Secondary | ICD-10-CM

## 2014-12-12 DIAGNOSIS — J45909 Unspecified asthma, uncomplicated: Secondary | ICD-10-CM | POA: Diagnosis present

## 2014-12-12 LAB — COMPREHENSIVE METABOLIC PANEL
ALT: 12 U/L (ref 0–35)
AST: 13 U/L (ref 0–37)
Albumin: 4.1 g/dL (ref 3.5–5.2)
Alkaline Phosphatase: 74 U/L (ref 39–117)
Anion gap: 9 (ref 5–15)
BILIRUBIN TOTAL: 0.4 mg/dL (ref 0.3–1.2)
BUN: 11 mg/dL (ref 6–23)
CHLORIDE: 106 mmol/L (ref 96–112)
CO2: 23 mmol/L (ref 19–32)
CREATININE: 0.77 mg/dL (ref 0.50–1.10)
Calcium: 9.2 mg/dL (ref 8.4–10.5)
Glucose, Bld: 92 mg/dL (ref 70–99)
Potassium: 3.4 mmol/L — ABNORMAL LOW (ref 3.5–5.1)
Sodium: 138 mmol/L (ref 135–145)
Total Protein: 7.2 g/dL (ref 6.0–8.3)

## 2014-12-12 LAB — CBC WITH DIFFERENTIAL/PLATELET
BASOS PCT: 0 % (ref 0–1)
Basophils Absolute: 0 10*3/uL (ref 0.0–0.1)
EOS PCT: 3 % (ref 0–5)
Eosinophils Absolute: 0.3 10*3/uL (ref 0.0–0.7)
HEMATOCRIT: 36.3 % (ref 36.0–46.0)
HEMOGLOBIN: 12 g/dL (ref 12.0–15.0)
Lymphocytes Relative: 59 % — ABNORMAL HIGH (ref 12–46)
Lymphs Abs: 4.8 10*3/uL — ABNORMAL HIGH (ref 0.7–4.0)
MCH: 27.4 pg (ref 26.0–34.0)
MCHC: 33.1 g/dL (ref 30.0–36.0)
MCV: 82.9 fL (ref 78.0–100.0)
Monocytes Absolute: 0.4 10*3/uL (ref 0.1–1.0)
Monocytes Relative: 5 % (ref 3–12)
NEUTROS PCT: 33 % — AB (ref 43–77)
Neutro Abs: 2.6 10*3/uL (ref 1.7–7.7)
PLATELETS: 356 10*3/uL (ref 150–400)
RBC: 4.38 MIL/uL (ref 3.87–5.11)
RDW: 15.3 % (ref 11.5–15.5)
WBC: 8.2 10*3/uL (ref 4.0–10.5)

## 2014-12-12 LAB — LIPASE, BLOOD: Lipase: 27 U/L (ref 11–59)

## 2014-12-12 MED ORDER — MORPHINE SULFATE 4 MG/ML IJ SOLN
4.0000 mg | Freq: Once | INTRAMUSCULAR | Status: AC
  Administered 2014-12-12: 4 mg via INTRAVENOUS
  Filled 2014-12-12: qty 1

## 2014-12-12 MED ORDER — ONDANSETRON HCL 4 MG/2ML IJ SOLN
4.0000 mg | Freq: Once | INTRAMUSCULAR | Status: AC
  Administered 2014-12-12: 4 mg via INTRAVENOUS
  Filled 2014-12-12: qty 2

## 2014-12-12 MED ORDER — SODIUM CHLORIDE 0.9 % IV BOLUS (SEPSIS)
1000.0000 mL | Freq: Once | INTRAVENOUS | Status: AC
  Administered 2014-12-12: 1000 mL via INTRAVENOUS

## 2014-12-12 NOTE — ED Provider Notes (Signed)
CSN: [id number]     Arrival date & time 12/12/14  2200 History  This chart was scribed for Tomasita CrumbleAdeleke Ervie Mccard, MD by Annye AsaAnna Dorsett, ED Scribe. This patient was seen in room B15C/B15C and the patient's care was started at 11:32 PM.    Chief Complaint  Patient presents with  . Abdominal Pain   The history is provided by the patient. No language interpreter was used.     HPI Comments: [name] is a 29 y.o. female who presents to the Emergency Department complaining of 4 months of gradually worsening LUQ abdominal pain, acutely worsening within the past two weeks and exacerbated with eating (particularly spicy or greasy foods). Patient reports associated vomiting; she notes a small amount of blood in her vomit tonight, prompting her to come to the ED. She states she was recently diagnosed with acute gallstone pancreatitis (Jan 2016). Patient reports that she took her last pain medication (oxycodone) yesterday, no medication PTA today. She denies nausea at present.   She is scheduled to see her PCP on 5/3 for a referral for a cholecystectomy.   Past Medical History  Diagnosis Date  . Asthma   . Headache(784.0)     migraines  . Depression   . PIH (pregnancy induced hypertension)   . Late prenatal care     22wks  . Anemia 2007  . HPV (human papilloma virus) anogenital infection   . Recurrent UTI   . Abnormal Pap smear   . Chlamydia infection     has no taken treatment as of 01/03/2013  . SVD (spontaneous vaginal delivery) 01/08/2013  . Vaginal Pap smear, abnormal   . Twin pregnancy in third trimester 08/08/2014  . Pancreatitis, acute 08/08/2014  . Pregnancy induced hypertension   . Acute gallstone pancreatitis 08/30/2014   Past Surgical History  Procedure Laterality Date  . No past surgeries    . Vaginal delivery N/A 09/18/2014    Procedure: VAGINAL DELIVERY;  Surgeon: Lavina Hammanodd Meisinger, MD;  Location: [facility];  Service: Obstetrics;  Laterality: N/A;   Family History  Problem Relation Age  of Onset  . Anesthesia problems Neg Hx   . Hypotension Neg Hx   . Malignant hyperthermia Neg Hx   . Pseudochol deficiency Neg Hx   . Heart disease Maternal Grandmother     clots in heart  . Stroke Maternal Grandmother   . Diabetes Mother   . Heart disease Maternal Grandfather     pacemaker  . Miscarriages / Stillbirths Paternal Aunt   . Stroke Paternal Grandmother    History  Substance Use Topics  . Smoking status: Current Every Day Smoker -- 0.25 packs/day for 5 years    Types: Cigarettes  . Smokeless tobacco: Never Used  . Alcohol Use: No   OB History    Gravida Para Term Preterm AB TAB SAB Ectopic Multiple Living   6 4 3 1 2  0 2 0 1 5     Review of Systems  Gastrointestinal: Positive for nausea, vomiting and abdominal pain.   Allergies  Corn syrup and Tramadol  Home Medications   Prior to Admission medications   Medication Sig Start Date End Date Taking? Authorizing Provider  acetaminophen (TYLENOL) 500 MG tablet Take 1,000 mg by mouth every 6 (six) hours as needed for mild pain or headache.    Historical Provider, MD  albuterol (PROVENTIL HFA;VENTOLIN HFA) 108 (90 BASE) MCG/ACT inhaler Inhale 2 puffs into the lungs every 6 (six) hours as needed for wheezing or  shortness of breath.    Historical Provider, MD  busPIRone (BUSPAR) 5 MG tablet Take 5 mg by mouth 2 (two) times daily.     Historical Provider, MD  chlorhexidine (PERIDEX) 0.12 % solution Swish and spit 10 mLs every morning.  04/24/14   Historical Provider, MD  clindamycin (CLEOCIN) 150 MG capsule Take 3 capsules (450 mg total) by mouth 3 (three) times daily. 11/29/14   Heather Laisure, PA-C  diphenhydrAMINE (BENADRYL) 25 MG tablet Take 25 mg by mouth every 6 (six) hours as needed for itching or allergies (allergies).     Historical Provider, MD  ferrous sulfate 325 (65 FE) MG tablet Take 1 tablet (325 mg total) by mouth daily with breakfast. Patient taking differently: Take 325 mg by mouth 3 (three) times daily  with meals.  08/10/14   Huel Cote, MD  HYDROcodone-acetaminophen (NORCO/VICODIN) 5-325 MG per tablet Take 1-2 tablets by mouth every 6 (six) hours as needed. 11/29/14   Heather Laisure, PA-C  ibuprofen (ADVIL,MOTRIN) 200 MG tablet Take 400 mg by mouth every 6 (six) hours as needed for moderate pain.    Historical Provider, MD  ibuprofen (ADVIL,MOTRIN) 600 MG tablet Take 1 tablet (600 mg total) by mouth every 6 (six) hours. Patient not taking: Reported on 10/18/2014 09/20/14   Lavina Hamman, MD  methocarbamol (ROBAXIN) 500 MG tablet Take 1 tablet (500 mg total) by mouth 2 (two) times daily. 10/18/14   Heather Laisure, PA-C  naproxen (NAPROSYN) 500 MG tablet Take 1 tablet (500 mg total) by mouth 2 (two) times daily. 10/18/14   Santiago Glad, PA-C  oxyCODONE-acetaminophen (PERCOCET/ROXICET) 5-325 MG per tablet Take 1-2 tablets by mouth every 4 (four) hours as needed (for pain scale less than 7). 09/20/14   Lavina Hamman, MD  Prenatal Vit-Fe Fumarate-FA (PRENATAL MULTIVITAMIN) TABS tablet Take 1 tablet by mouth daily.     Historical Provider, MD  sertraline (ZOLOFT) 25 MG tablet Take 25 mg by mouth daily.    Historical Provider, MD  traMADol (ULTRAM) 50 MG tablet Take 1 tablet (50 mg total) by mouth every 6 (six) hours as needed. 10/18/14   Heather Laisure, PA-C   BP 133/73 mmHg  Pulse 87  Temp(Src) 97.6 F (36.4 C) (Oral)  Resp 16  SpO2 100% Physical Exam  Constitutional: She is oriented to person, place, and time. She appears well-developed and well-nourished. No distress.  HENT:  Head: Normocephalic and atraumatic.  Nose: Nose normal.  Mouth/Throat: Oropharynx is clear and moist. No oropharyngeal exudate.  Eyes: Conjunctivae and EOM are normal. Pupils are equal, round, and reactive to light. No scleral icterus.  Neck: Normal range of motion. Neck supple. No JVD present. No tracheal deviation present. No thyromegaly present.  Cardiovascular: Normal rate, regular rhythm and normal heart sounds.   Exam reveals no gallop and no friction rub.   No murmur heard. Pulmonary/Chest: Effort normal and breath sounds normal. No respiratory distress. She has no wheezes. She exhibits no tenderness.  Abdominal: Soft. Bowel sounds are normal. She exhibits no distension and no mass. There is no tenderness. There is no rebound and no guarding.  Musculoskeletal: Normal range of motion. She exhibits no edema or tenderness.  Lymphadenopathy:    She has no cervical adenopathy.  Neurological: She is alert and oriented to person, place, and time. No cranial nerve deficit. She exhibits normal muscle tone.  Skin: Skin is warm and dry. No rash noted. No erythema. No pallor.  Nursing note and vitals reviewed.   ED Course  Procedures   DIAGNOSTIC STUDIES: Oxygen Saturation is 100% on RA, normal by my interpretation.    COORDINATION OF CARE: 11:39 PM Discussed treatment plan with pt at bedside and pt agreed to plan.   Labs Review Labs Reviewed  CBC WITH DIFFERENTIAL/PLATELET - Abnormal; Notable for the following:    Neutrophils Relative % 33 (*)    Lymphocytes Relative 59 (*)    Lymphs Abs 4.8 (*)    All other components within normal limits  COMPREHENSIVE METABOLIC PANEL - Abnormal; Notable for the following:    Potassium 3.4 (*)    All other components within normal limits  URINALYSIS, ROUTINE W REFLEX MICROSCOPIC - Abnormal; Notable for the following:    APPearance TURBID (*)    Leukocytes, UA LARGE (*)    All other components within normal limits  URINE MICROSCOPIC-ADD ON - Abnormal; Notable for the following:    Squamous Epithelial / LPF MANY (*)    Bacteria, UA MANY (*)    All other components within normal limits  LIPASE, BLOOD  POC URINE PREG, ED  I-STAT BETA HCG BLOOD, ED ([facility], [facility], [facility] ONLY)    Imaging Review US Abdomen Limited Ruq  12/13/2014   CLINICAL DATA:  Acute onset of right upper quadrant abdominal pain. Initial encounter.  EXAM: US ABDOMEN LIMITED - RIGHT UPPER QUADRANT   COMPARISON:  Abdominal ultrasound performed 08/30/2014  FINDINGS: Gallbladder:  Several mobile stones are seen within the gallbladder, with a large 1.3 cm stone lodged at the gallbladder neck. No significant gallbladder wall thickening or pericholecystic fluid is seen. No ultrasonographic Murphy's sign is elicited; right upper quadrant tenderness is noted.  Common bile duct:  Diameter: 0.4 cm, within normal limits in caliber.  Liver:  No focal lesion identified. Within normal limits in parenchymal echogenicity.  IMPRESSION: Cholelithiasis, with a large 1.3 cm stone lodged at the gallbladder neck. No ultrasonographic Murphy's sign elicited, though right upper quadrant tenderness is noted. This may reflect intermittent obstruction due to the stone at the gallbladder neck; no evidence of cholecystitis. The common bile duct is normal in caliber.   Electronically Signed   By: Roanna Raider M.D.   On: 12/13/2014 04:11     EKG Interpretation None      MDM   Final diagnoses:  None   Patient since emergency department for midepigastric and right upper quadrant abdominal pain. She has a history of cholelithiasis and pancreatitis, she states this is consistent with her cholelithiasis pain. Laboratory values here did not reveal any significant change from her baseline. Physical exam does show tenderness in the right upper quadrant. Patient was given morphine and Zofran for symptomatically relief. Will obtain right upper quadrant ultrasound to evaluate for any progression of her cholelithiasis.  Ultrasound reveals a 1.3 cm large stone lodged at the gallbladder neck. Patient continues to have significant pain. Exam 2 doses of morphine and 1 dose of Dilaudid. I consult to Gen. surgery for evaluation of a minute the patient for treatment.  I personally performed the services described in this documentation, which was scribed in my presence. The recorded information has been reviewed and is accurate.       Tomasita Crumble, MD 12/13/14 [phone]

## 2014-12-12 NOTE — ED Notes (Signed)
Pt in c/o abd pain after eating, recently dx with gallstone and pancreatitis, states tonight when she vomited after dinner she noted small amounts of blood, no distress noted

## 2014-12-13 ENCOUNTER — Encounter (HOSPITAL_COMMUNITY): Payer: Self-pay | Admitting: Certified Registered"

## 2014-12-13 ENCOUNTER — Inpatient Hospital Stay (HOSPITAL_COMMUNITY): Payer: Medicaid Other

## 2014-12-13 ENCOUNTER — Inpatient Hospital Stay (HOSPITAL_COMMUNITY): Payer: Medicaid Other | Admitting: Certified Registered"

## 2014-12-13 ENCOUNTER — Emergency Department (HOSPITAL_COMMUNITY): Payer: Medicaid Other

## 2014-12-13 ENCOUNTER — Encounter (HOSPITAL_COMMUNITY): Admission: EM | Disposition: A | Discharge status: Home / Self Care | Payer: Self-pay | Source: Home / Self Care

## 2014-12-13 DIAGNOSIS — J45909 Unspecified asthma, uncomplicated: Secondary | ICD-10-CM | POA: Diagnosis present

## 2014-12-13 DIAGNOSIS — G43909 Migraine, unspecified, not intractable, without status migrainosus: Secondary | ICD-10-CM | POA: Diagnosis present

## 2014-12-13 DIAGNOSIS — Z87440 Personal history of urinary (tract) infections: Secondary | ICD-10-CM | POA: Diagnosis not present

## 2014-12-13 DIAGNOSIS — F17210 Nicotine dependence, cigarettes, uncomplicated: Secondary | ICD-10-CM | POA: Diagnosis present

## 2014-12-13 DIAGNOSIS — F329 Major depressive disorder, single episode, unspecified: Secondary | ICD-10-CM | POA: Diagnosis present

## 2014-12-13 DIAGNOSIS — K810 Acute cholecystitis: Secondary | ICD-10-CM | POA: Diagnosis present

## 2014-12-13 DIAGNOSIS — R1011 Right upper quadrant pain: Secondary | ICD-10-CM | POA: Diagnosis present

## 2014-12-13 DIAGNOSIS — Z888 Allergy status to other drugs, medicaments and biological substances status: Secondary | ICD-10-CM | POA: Diagnosis not present

## 2014-12-13 DIAGNOSIS — K8000 Calculus of gallbladder with acute cholecystitis without obstruction: Secondary | ICD-10-CM | POA: Diagnosis present

## 2014-12-13 HISTORY — PX: CHOLECYSTECTOMY: SHX55

## 2014-12-13 LAB — URINALYSIS, ROUTINE W REFLEX MICROSCOPIC
Bilirubin Urine: NEGATIVE
Glucose, UA: NEGATIVE mg/dL
HGB URINE DIPSTICK: NEGATIVE
Ketones, ur: NEGATIVE mg/dL
Nitrite: NEGATIVE
Protein, ur: NEGATIVE mg/dL
Specific Gravity, Urine: 1.017 (ref 1.005–1.030)
UROBILINOGEN UA: 0.2 mg/dL (ref 0.0–1.0)
pH: 5.5 (ref 5.0–8.0)

## 2014-12-13 LAB — CBC
HEMATOCRIT: 33.6 % — AB (ref 36.0–46.0)
HEMOGLOBIN: 10.9 g/dL — AB (ref 12.0–15.0)
MCH: 27.2 pg (ref 26.0–34.0)
MCHC: 32.4 g/dL (ref 30.0–36.0)
MCV: 83.8 fL (ref 78.0–100.0)
PLATELETS: 301 10*3/uL (ref 150–400)
RBC: 4.01 MIL/uL (ref 3.87–5.11)
RDW: 15.4 % (ref 11.5–15.5)
WBC: 7.2 10*3/uL (ref 4.0–10.5)

## 2014-12-13 LAB — URINE MICROSCOPIC-ADD ON

## 2014-12-13 LAB — I-STAT BETA HCG BLOOD, ED (MC, WL, AP ONLY): I-stat hCG, quantitative: <5 m[IU]/mL (ref <5)

## 2014-12-13 LAB — CREATININE, SERUM
CREATININE: 0.68 mg/dL (ref 0.50–1.10)
GFR calc Af Amer: >90 mL/min (ref >90)
GFR calc non Af Amer: >90 mL/min (ref >90)

## 2014-12-13 LAB — POC URINE PREG, ED: PREG TEST UR: NEGATIVE

## 2014-12-13 SURGERY — LAPAROSCOPIC CHOLECYSTECTOMY WITH INTRAOPERATIVE CHOLANGIOGRAM
Anesthesia: General | Site: Abdomen

## 2014-12-13 MED ORDER — MIDAZOLAM HCL 5 MG/5ML IJ SOLN
INTRAMUSCULAR | Status: DC | PRN
  Administered 2014-12-13: 2 mg via INTRAVENOUS

## 2014-12-13 MED ORDER — OXYCODONE HCL 5 MG PO TABS
ORAL_TABLET | ORAL | Status: AC
  Administered 2014-12-13: 5 mg via ORAL
  Filled 2014-12-13: qty 1

## 2014-12-13 MED ORDER — DEXTROSE-NACL 5-0.9 % IV SOLN
INTRAVENOUS | Status: DC
  Administered 2014-12-13: 06:00:00 via INTRAVENOUS

## 2014-12-13 MED ORDER — MIDAZOLAM HCL 2 MG/2ML IJ SOLN
INTRAMUSCULAR | Status: AC
  Filled 2014-12-13: qty 2

## 2014-12-13 MED ORDER — HYDROMORPHONE HCL 1 MG/ML IJ SOLN
INTRAMUSCULAR | Status: AC
  Administered 2014-12-13: 0.5 mg via INTRAVENOUS
  Filled 2014-12-13: qty 1

## 2014-12-13 MED ORDER — NEOSTIGMINE METHYLSULFATE 10 MG/10ML IV SOLN
INTRAVENOUS | Status: DC | PRN
  Administered 2014-12-13: 3 mg via INTRAVENOUS

## 2014-12-13 MED ORDER — HYDROMORPHONE HCL 1 MG/ML IJ SOLN
1.0000 mg | Freq: Once | INTRAMUSCULAR | Status: AC
  Administered 2014-12-13: 1 mg via INTRAVENOUS
  Filled 2014-12-13: qty 1

## 2014-12-13 MED ORDER — HEMOSTATIC AGENTS (NO CHARGE) OPTIME
TOPICAL | Status: DC | PRN
  Administered 2014-12-13: 1 via TOPICAL

## 2014-12-13 MED ORDER — SODIUM CHLORIDE 0.9 % IR SOLN
Status: DC | PRN
  Administered 2014-12-13: 1000 mL

## 2014-12-13 MED ORDER — LACTATED RINGERS IV SOLN
INTRAVENOUS | Status: DC
  Administered 2014-12-13: 10:00:00 via INTRAVENOUS
  Administered 2014-12-13: 50 mL/h via INTRAVENOUS

## 2014-12-13 MED ORDER — DEXAMETHASONE SODIUM PHOSPHATE 4 MG/ML IJ SOLN
INTRAMUSCULAR | Status: DC | PRN
  Administered 2014-12-13: 8 mg via INTRAVENOUS

## 2014-12-13 MED ORDER — FENTANYL CITRATE (PF) 100 MCG/2ML IJ SOLN
INTRAMUSCULAR | Status: DC | PRN
  Administered 2014-12-13: 100 ug via INTRAVENOUS
  Administered 2014-12-13: 50 ug via INTRAVENOUS
  Administered 2014-12-13: 150 ug via INTRAVENOUS

## 2014-12-13 MED ORDER — LIDOCAINE HCL (CARDIAC) 20 MG/ML IV SOLN
INTRAVENOUS | Status: DC | PRN
  Administered 2014-12-13: 60 mg via INTRAVENOUS

## 2014-12-13 MED ORDER — BUPIVACAINE-EPINEPHRINE 0.25% -1:200000 IJ SOLN
INTRAMUSCULAR | Status: DC | PRN
  Administered 2014-12-13: 10 mL

## 2014-12-13 MED ORDER — GLYCOPYRROLATE 0.2 MG/ML IJ SOLN
INTRAMUSCULAR | Status: AC
  Filled 2014-12-13: qty 2

## 2014-12-13 MED ORDER — POTASSIUM CHLORIDE CRYS ER 20 MEQ PO TBCR
40.0000 meq | EXTENDED_RELEASE_TABLET | Freq: Once | ORAL | Status: AC
Start: 2014-12-13 — End: 2014-12-13
  Administered 2014-12-13: 40 meq via ORAL
  Filled 2014-12-13: qty 2

## 2014-12-13 MED ORDER — ONDANSETRON HCL 4 MG/2ML IJ SOLN
INTRAMUSCULAR | Status: AC
  Filled 2014-12-13: qty 2

## 2014-12-13 MED ORDER — ONDANSETRON HCL 4 MG/2ML IJ SOLN
4.0000 mg | Freq: Four times a day (QID) | INTRAMUSCULAR | Status: DC | PRN
  Administered 2014-12-13: 4 mg via INTRAVENOUS

## 2014-12-13 MED ORDER — BUPIVACAINE-EPINEPHRINE (PF) 0.25% -1:200000 IJ SOLN
INTRAMUSCULAR | Status: AC
  Filled 2014-12-13: qty 30

## 2014-12-13 MED ORDER — FENTANYL CITRATE (PF) 250 MCG/5ML IJ SOLN
INTRAMUSCULAR | Status: AC
  Filled 2014-12-13: qty 5

## 2014-12-13 MED ORDER — MORPHINE SULFATE 4 MG/ML IJ SOLN
4.0000 mg | Freq: Once | INTRAMUSCULAR | Status: AC
  Administered 2014-12-13: 4 mg via INTRAVENOUS
  Filled 2014-12-13: qty 1

## 2014-12-13 MED ORDER — ROCURONIUM BROMIDE 100 MG/10ML IV SOLN
INTRAVENOUS | Status: DC | PRN
  Administered 2014-12-13: 40 mg via INTRAVENOUS

## 2014-12-13 MED ORDER — GLYCOPYRROLATE 0.2 MG/ML IJ SOLN
INTRAMUSCULAR | Status: DC | PRN
  Administered 2014-12-13: 0.4 mg via INTRAVENOUS

## 2014-12-13 MED ORDER — SODIUM CHLORIDE 0.9 % IV SOLN
INTRAVENOUS | Status: DC | PRN
  Administered 2014-12-13: 14 mL

## 2014-12-13 MED ORDER — OXYCODONE HCL 5 MG PO TABS
5.0000 mg | ORAL_TABLET | Freq: Once | ORAL | Status: AC
  Administered 2014-12-13: 5 mg via ORAL

## 2014-12-13 MED ORDER — IBUPROFEN 600 MG PO TABS: 600.0000 mg | ORAL_TABLET | Freq: Four times a day (QID) | ORAL | Status: DC | PRN

## 2014-12-13 MED ORDER — PROPOFOL 10 MG/ML IV BOLUS
INTRAVENOUS | Status: DC | PRN
  Administered 2014-12-13: 130 mg via INTRAVENOUS

## 2014-12-13 MED ORDER — ENOXAPARIN SODIUM 40 MG/0.4ML ~~LOC~~ SOLN
40.0000 mg | SUBCUTANEOUS | Status: DC
  Filled 2014-12-13: qty 0.4

## 2014-12-13 MED ORDER — LIDOCAINE HCL (CARDIAC) 20 MG/ML IV SOLN
INTRAVENOUS | Status: AC
  Filled 2014-12-13: qty 5

## 2014-12-13 MED ORDER — NEOSTIGMINE METHYLSULFATE 10 MG/10ML IV SOLN
INTRAVENOUS | Status: AC
Start: 2014-12-13 — End: 2014-12-13
  Filled 2014-12-13: qty 1

## 2014-12-13 MED ORDER — OXYCODONE-ACETAMINOPHEN 5-325 MG PO TABS
1.0000 | ORAL_TABLET | ORAL | Status: DC | PRN
  Administered 2014-12-13 – 2014-12-14 (×5): 2 via ORAL
  Filled 2014-12-13 (×5): qty 2

## 2014-12-13 MED ORDER — HYDROMORPHONE HCL 1 MG/ML IJ SOLN
1.0000 mg | INTRAMUSCULAR | Status: DC | PRN
  Administered 2014-12-13 – 2014-12-14 (×8): 1 mg via INTRAVENOUS
  Filled 2014-12-13 (×8): qty 1

## 2014-12-13 MED ORDER — CIPROFLOXACIN IN D5W 400 MG/200ML IV SOLN
400.0000 mg | Freq: Two times a day (BID) | INTRAVENOUS | Status: DC
  Administered 2014-12-13 – 2014-12-14 (×3): 400 mg via INTRAVENOUS
  Filled 2014-12-13 (×7): qty 200

## 2014-12-13 MED ORDER — ROCURONIUM BROMIDE 50 MG/5ML IV SOLN
INTRAVENOUS | Status: AC
  Filled 2014-12-13: qty 1

## 2014-12-13 MED ORDER — DEXAMETHASONE SODIUM PHOSPHATE 4 MG/ML IJ SOLN
INTRAMUSCULAR | Status: AC
  Filled 2014-12-13: qty 2

## 2014-12-13 MED ORDER — SUCCINYLCHOLINE CHLORIDE 20 MG/ML IJ SOLN
INTRAMUSCULAR | Status: AC
  Filled 2014-12-13: qty 1

## 2014-12-13 MED ORDER — PROPOFOL 10 MG/ML IV BOLUS
INTRAVENOUS | Status: AC
Start: 2014-12-13 — End: 2014-12-13
  Filled 2014-12-13: qty 20

## 2014-12-13 MED ORDER — HYDROMORPHONE HCL 1 MG/ML IJ SOLN
0.2500 mg | INTRAMUSCULAR | Status: DC | PRN
  Administered 2014-12-13 (×4): 0.5 mg via INTRAVENOUS

## 2014-12-13 MED ORDER — 0.9 % SODIUM CHLORIDE (POUR BTL) OPTIME
TOPICAL | Status: DC | PRN
  Administered 2014-12-13: 1000 mL

## 2014-12-13 MED ORDER — KETOROLAC TROMETHAMINE 30 MG/ML IJ SOLN
INTRAMUSCULAR | Status: AC
Start: 2014-12-13 — End: 2014-12-13
  Filled 2014-12-13: qty 1

## 2014-12-13 MED ORDER — ENOXAPARIN SODIUM 40 MG/0.4ML ~~LOC~~ SOLN: 40.0000 mg | SUBCUTANEOUS | Status: DC

## 2014-12-13 MED ORDER — KETOROLAC TROMETHAMINE 30 MG/ML IJ SOLN
30.0000 mg | Freq: Once | INTRAMUSCULAR | Status: AC
  Administered 2014-12-13: 30 mg via INTRAVENOUS

## 2014-12-13 SURGICAL SUPPLY — 52 items
APL SKNCLS STERI-STRIP NONHPOA (GAUZE/BANDAGES/DRESSINGS) ×1
APPLIER CLIP ROT 10 11.4 M/L (STAPLE) ×3
APR CLP MED LRG 11.4X10 (STAPLE) ×1
BAG SPEC RTRVL LRG 6X4 10 (ENDOMECHANICALS) ×1
BENZOIN TINCTURE PRP APPL 2/3 (GAUZE/BANDAGES/DRESSINGS) ×3 IMPLANT
BLADE SURG ROTATE 9660 (MISCELLANEOUS) IMPLANT
CANISTER SUCTION 2500CC (MISCELLANEOUS) ×3 IMPLANT
CHLORAPREP W/TINT 26ML (MISCELLANEOUS) ×3 IMPLANT
CLIP APPLIE ROT 10 11.4 M/L (STAPLE) ×1 IMPLANT
CLOSURE WOUND 1/2 X4 (GAUZE/BANDAGES/DRESSINGS) ×1
COVER MAYO STAND STRL (DRAPES) ×3 IMPLANT
COVER SURGICAL LIGHT HANDLE (MISCELLANEOUS) ×3 IMPLANT
DRAPE C-ARM 42X72 X-RAY (DRAPES) ×3 IMPLANT
DRAPE LAPAROSCOPIC ABDOMINAL (DRAPES) ×3 IMPLANT
DRSG TEGADERM 2-3/8X2-3/4 SM (GAUZE/BANDAGES/DRESSINGS) ×9 IMPLANT
DRSG TEGADERM 4X4.75 (GAUZE/BANDAGES/DRESSINGS) ×3 IMPLANT
ELECT REM PT RETURN 9FT ADLT (ELECTROSURGICAL) ×3
ELECTRODE REM PT RTRN 9FT ADLT (ELECTROSURGICAL) ×1 IMPLANT
FILTER SMOKE EVAC LAPAROSHD (FILTER) ×3 IMPLANT
GAUZE SPONGE 2X2 8PLY STRL LF (GAUZE/BANDAGES/DRESSINGS) ×1 IMPLANT
GLOVE BIO SURGEON STRL SZ7 (GLOVE) ×3 IMPLANT
GLOVE BIOGEL PI IND STRL 7.5 (GLOVE) ×1 IMPLANT
GLOVE BIOGEL PI IND STRL 8 (GLOVE) IMPLANT
GLOVE BIOGEL PI INDICATOR 7.5 (GLOVE) ×2
GLOVE BIOGEL PI INDICATOR 8 (GLOVE) ×2
GLOVE SURG SS PI 8.0 STRL IVOR (GLOVE) ×2 IMPLANT
GOWN STRL REUS W/ TWL LRG LVL3 (GOWN DISPOSABLE) ×3 IMPLANT
GOWN STRL REUS W/ TWL XL LVL3 (GOWN DISPOSABLE) IMPLANT
GOWN STRL REUS W/TWL LRG LVL3 (GOWN DISPOSABLE) ×6
GOWN STRL REUS W/TWL XL LVL3 (GOWN DISPOSABLE) ×3
HEMOSTAT SNOW SURGICEL 2X4 (HEMOSTASIS) ×2 IMPLANT
KIT BASIN OR (CUSTOM PROCEDURE TRAY) ×3 IMPLANT
KIT ROOM TURNOVER OR (KITS) ×3 IMPLANT
NS IRRIG 1000ML POUR BTL (IV SOLUTION) ×3 IMPLANT
PAD ARMBOARD 7.5X6 YLW CONV (MISCELLANEOUS) ×3 IMPLANT
PENCIL BUTTON HOLSTER BLD 10FT (ELECTRODE) ×2 IMPLANT
POUCH SPECIMEN RETRIEVAL 10MM (ENDOMECHANICALS) ×3 IMPLANT
SCISSORS LAP 5X35 DISP (ENDOMECHANICALS) ×3 IMPLANT
SET CHOLANGIOGRAPH 5 50 .035 (SET/KITS/TRAYS/PACK) ×3 IMPLANT
SET IRRIG TUBING LAPAROSCOPIC (IRRIGATION / IRRIGATOR) ×3 IMPLANT
SLEEVE ENDOPATH XCEL 5M (ENDOMECHANICALS) ×3 IMPLANT
SPECIMEN JAR SMALL (MISCELLANEOUS) ×3 IMPLANT
SPONGE GAUZE 2X2 STER 10/PKG (GAUZE/BANDAGES/DRESSINGS) ×2
STRIP CLOSURE SKIN 1/2X4 (GAUZE/BANDAGES/DRESSINGS) ×1 IMPLANT
SUT MNCRL AB 4-0 PS2 18 (SUTURE) ×3 IMPLANT
TOWEL OR 17X24 6PK STRL BLUE (TOWEL DISPOSABLE) ×3 IMPLANT
TOWEL OR 17X26 10 PK STRL BLUE (TOWEL DISPOSABLE) ×3 IMPLANT
TRAY LAPAROSCOPIC (CUSTOM PROCEDURE TRAY) ×3 IMPLANT
TROCAR XCEL BLUNT TIP 100MML (ENDOMECHANICALS) ×3 IMPLANT
TROCAR XCEL NON-BLD 11X100MML (ENDOMECHANICALS) ×3 IMPLANT
TROCAR XCEL NON-BLD 5MMX100MML (ENDOMECHANICALS) ×3 IMPLANT
TUBING INSUFFLATION (TUBING) ×3 IMPLANT

## 2014-12-13 NOTE — Anesthesia Preprocedure Evaluation (Addendum)
Anesthesia Evaluation  Patient identified by MRN, date of birth, ID band Patient awake    Reviewed: Allergy & Precautions, H&P , NPO status , Patient's Chart, lab work & pertinent test results  Airway Mallampati: II  TM Distance: >3 FB Neck ROM: Full    Dental no notable dental hx. (+) Partial Upper, Dental Advisory Given   Pulmonary asthma , Current Smoker,  breath sounds clear to auscultation  Pulmonary exam normal       Cardiovascular hypertension, Pt. on medications Rhythm:Regular Rate:Normal     Neuro/Psych  Headaches, Depression    GI/Hepatic negative GI ROS, Neg liver ROS,   Endo/Other  negative endocrine ROS  Renal/GU negative Renal ROS  negative genitourinary   Musculoskeletal   Abdominal   Peds  Hematology negative hematology ROS (+)   Anesthesia Other Findings   Reproductive/Obstetrics negative OB ROS                            Anesthesia Physical Anesthesia Plan  ASA: II  Anesthesia Plan: General   Post-op Pain Management:    Induction: Intravenous  Airway Management Planned: Oral ETT  Additional Equipment:   Intra-op Plan:   Post-operative Plan: Extubation in OR  Informed Consent: I have reviewed the patients History and Physical, chart, labs and discussed the procedure including the risks, benefits and alternatives for the proposed anesthesia with the patient or authorized representative who has indicated his/her understanding and acceptance.   Dental advisory given  Plan Discussed with: CRNA  Anesthesia Plan Comments:         Anesthesia Quick Evaluation

## 2014-12-13 NOTE — ED Notes (Signed)
Dr. Harlon Florseui at bedside speaking with pt regarding surgery. Pt will go to OR this am to remove gallbladder.

## 2014-12-13 NOTE — Transfer of Care (Signed)
Immediate Anesthesia Transfer of Care Note  Patient: [name]  Procedure(s) Performed: Procedure(s): LAPAROSCOPIC CHOLECYSTECTOMY WITH INTRAOPERATIVE CHOLANGIOGRAM (N/A)  Patient Location: PACU  Anesthesia Type:General  Level of Consciousness: awake, alert , oriented and patient cooperative  Airway & Oxygen Therapy: Patient Spontanous Breathing and Patient connected to nasal cannula oxygen  Post-op Assessment: Report given to RN, Post -op Vital signs reviewed and stable and Patient moving all extremities  Post vital signs: Reviewed and stable  Last Vitals:  Filed Vitals:   12/13/14 0840  BP: 103/59  Pulse: 75  Temp:   Resp: 18    Complications: No apparent anesthesia complications

## 2014-12-13 NOTE — Anesthesia Procedure Notes (Signed)
Procedure Name: Intubation Date/Time: 12/13/2014 10:31 AM Performed by: Jerilee HohMUMM, Lama Narayanan N Pre-anesthesia Checklist: Patient identified, Emergency Drugs available, Suction available and Patient being monitored Patient Re-evaluated:Patient Re-evaluated prior to inductionOxygen Delivery Method: Circle system utilized Preoxygenation: Pre-oxygenation with 100% oxygen Intubation Type: IV induction Ventilation: Mask ventilation without difficulty Laryngoscope Size: Mac and 3 Grade View: Grade I Tube type: Oral Tube size: 7.0 mm Number of attempts: 1 Airway Equipment and Method: Stylet Placement Confirmation: ETT inserted through vocal cords under direct vision,  positive ETCO2 and breath sounds checked- equal and bilateral Secured at: 21 cm Tube secured with: Tape Dental Injury: Teeth and Oropharynx as per pre-operative assessment

## 2014-12-13 NOTE — Progress Notes (Signed)
Care of pt assumed by MA Daesha Insco RN 

## 2014-12-13 NOTE — ED Notes (Signed)
All pt belongings given to pts grandpa including cell phone.

## 2014-12-13 NOTE — ED Notes (Signed)
Patient transported to Ultrasound 

## 2014-12-13 NOTE — H&P (Signed)
[name] is an 29 y.o. female.   Chief Complaint: RUQ abdominal pain HPI: pt with history of gallstones and last attack in December presents with 1 day history of RUQ pain and nausea. Has had pancreatitis in the past as well.  Pain is sharp and constant.  Pain medication not helping.  Severe in nature.   Past Medical History  Diagnosis Date  . Asthma   . Headache(784.0)     migraines  . Depression   . PIH (pregnancy induced hypertension)   . Late prenatal care     22wks  . Anemia 2007  . HPV (human papilloma virus) anogenital infection   . Recurrent UTI   . Abnormal Pap smear   . Chlamydia infection     has no taken treatment as of 01/03/2013  . SVD (spontaneous vaginal delivery) 01/08/2013  . Vaginal Pap smear, abnormal   . Twin pregnancy in third trimester 08/08/2014  . Pancreatitis, acute 08/08/2014  . Pregnancy induced hypertension   . Acute gallstone pancreatitis 08/30/2014    Past Surgical History  Procedure Laterality Date  . No past surgeries    . Vaginal delivery N/A 09/18/2014    Procedure: VAGINAL DELIVERY;  Surgeon: Cheri Fowler, MD;  Location: [facility];  Service: Obstetrics;  Laterality: N/A;    Family History  Problem Relation Age of Onset  . Anesthesia problems Neg Hx   . Hypotension Neg Hx   . Malignant hyperthermia Neg Hx   . Pseudochol deficiency Neg Hx   . Heart disease Maternal Grandmother     clots in heart  . Stroke Maternal Grandmother   . Diabetes Mother   . Heart disease Maternal Grandfather     pacemaker  . Miscarriages / Stillbirths Paternal Aunt   . Stroke Paternal Grandmother    Social History:  reports that she has been smoking Cigarettes.  She has a 1.25 pack-year smoking history. She has never used smokeless tobacco. She reports that she does not drink alcohol or use illicit drugs.  Allergies:  Allergies  Allergen Reactions  . Corn Syrup Anaphylaxis and Hives  . Tramadol      (Not in a hospital admission)  Results for  orders placed or performed during the hospital encounter of 12/12/14 (from the past 48 hour(s))  CBC WITH DIFFERENTIAL     Status: Abnormal   Collection Time: 12/12/14 10:14 PM  Result Value Ref Range   WBC 8.2 4.0 - 10.5 K/uL   RBC 4.38 3.87 - 5.11 MIL/uL   Hemoglobin 12.0 12.0 - 15.0 g/dL   HCT 36.3 36.0 - 46.0 %   MCV 82.9 78.0 - 100.0 fL   MCH 27.4 26.0 - 34.0 pg   MCHC 33.1 30.0 - 36.0 g/dL   RDW 15.3 11.5 - 15.5 %   Platelets 356 150 - 400 K/uL   Neutrophils Relative % 33 (L) 43 - 77 %   Neutro Abs 2.6 1.7 - 7.7 K/uL   Lymphocytes Relative 59 (H) 12 - 46 %   Lymphs Abs 4.8 (H) 0.7 - 4.0 K/uL   Monocytes Relative 5 3 - 12 %   Monocytes Absolute 0.4 0.1 - 1.0 K/uL   Eosinophils Relative 3 0 - 5 %   Eosinophils Absolute 0.3 0.0 - 0.7 K/uL   Basophils Relative 0 0 - 1 %   Basophils Absolute 0.0 0.0 - 0.1 K/uL  Comprehensive metabolic panel     Status: Abnormal   Collection Time: 12/12/14 10:14 PM  Result  Value Ref Range   Sodium 138 135 - 145 mmol/L   Potassium 3.4 (L) 3.5 - 5.1 mmol/L   Chloride 106 96 - 112 mmol/L   CO2 23 19 - 32 mmol/L   Glucose, Bld 92 70 - 99 mg/dL   BUN 11 6 - 23 mg/dL   Creatinine, Ser 0.77 0.50 - 1.10 mg/dL   Calcium 9.2 8.4 - 10.5 mg/dL   Total Protein 7.2 6.0 - 8.3 g/dL   Albumin 4.1 3.5 - 5.2 g/dL   AST 13 0 - 37 U/L   ALT 12 0 - 35 U/L   Alkaline Phosphatase 74 39 - 117 U/L   Total Bilirubin 0.4 0.3 - 1.2 mg/dL   GFR calc non Af Amer >90 >90 mL/min   GFR calc Af Amer >90 >90 mL/min    Comment: (NOTE) The eGFR has been calculated using the CKD EPI equation. This calculation has not been validated in all clinical situations. eGFR's persistently <90 mL/min signify possible Chronic Kidney Disease.    Anion gap 9 5 - 15  Lipase, blood     Status: None   Collection Time: 12/12/14 10:14 PM  Result Value Ref Range   Lipase 27 11 - 59 U/L  Urinalysis, Routine w reflex microscopic     Status: Abnormal   Collection Time: 12/12/14 11:49 PM   Result Value Ref Range   Color, Urine YELLOW YELLOW   APPearance TURBID (A) CLEAR   Specific Gravity, Urine 1.017 1.005 - 1.030   pH 5.5 5.0 - 8.0   Glucose, UA NEGATIVE NEGATIVE mg/dL   Hgb urine dipstick NEGATIVE NEGATIVE   Bilirubin Urine NEGATIVE NEGATIVE   Ketones, ur NEGATIVE NEGATIVE mg/dL   Protein, ur NEGATIVE NEGATIVE mg/dL   Urobilinogen, UA 0.2 0.0 - 1.0 mg/dL   Nitrite NEGATIVE NEGATIVE   Leukocytes, UA LARGE (A) NEGATIVE  Urine microscopic-add on     Status: Abnormal   Collection Time: 12/12/14 11:49 PM  Result Value Ref Range   Squamous Epithelial / LPF MANY (A) RARE   WBC, UA TOO NUMEROUS TO COUNT <3 WBC/hpf   Bacteria, UA MANY (A) RARE  POC Urine Pregnancy, ED (do NOT order at [facility])     Status: None   Collection Time: 12/12/14 11:53 PM  Result Value Ref Range   Preg Test, Ur NEGATIVE NEGATIVE    Comment:        THE SENSITIVITY OF THIS METHODOLOGY IS >24 mIU/mL   I-Stat Beta hCG blood, ED ([facility], [facility], [facility] only)     Status: None   Collection Time: 12/13/14 12:09 AM  Result Value Ref Range   I-stat hCG, quantitative <5.0 <5 mIU/mL   Comment 3            Comment:   GEST. AGE      CONC.  (mIU/mL)   <=1 WEEK        5 - 50     2 WEEKS       50 - 500     3 WEEKS       100 - 10,000     4 WEEKS     1,000 - 30,000        FEMALE AND NON-PREGNANT FEMALE:     LESS THAN 5 mIU/mL    US Abdomen Limited Ruq  12/13/2014   CLINICAL DATA:  Acute onset of right upper quadrant abdominal pain. Initial encounter.  EXAM: US ABDOMEN LIMITED - RIGHT UPPER QUADRANT  COMPARISON:  Abdominal  ultrasound performed 08/30/2014  FINDINGS: Gallbladder:  Several mobile stones are seen within the gallbladder, with a large 1.3 cm stone lodged at the gallbladder neck. No significant gallbladder wall thickening or pericholecystic fluid is seen. No ultrasonographic Murphy's sign is elicited; right upper quadrant tenderness is noted.  Common bile duct:  Diameter: 0.4 cm, within normal limits in caliber.   Liver:  No focal lesion identified. Within normal limits in parenchymal echogenicity.  IMPRESSION: Cholelithiasis, with a large 1.3 cm stone lodged at the gallbladder neck. No ultrasonographic Murphy's sign elicited, though right upper quadrant tenderness is noted. This may reflect intermittent obstruction due to the stone at the gallbladder neck; no evidence of cholecystitis. The common bile duct is normal in caliber.   Electronically Signed   By: Garald Balding M.D.   On: 12/13/2014 04:11    Review of Systems  Constitutional: Positive for malaise/fatigue.  HENT: Negative.   Eyes: Negative.   Respiratory: Negative.   Cardiovascular: Negative.   Gastrointestinal: Positive for nausea and abdominal pain.  Genitourinary: Negative.   Musculoskeletal: Negative.   Skin: Negative.   Psychiatric/Behavioral: Negative.     Blood pressure 99/46, pulse 91, temperature 97.6 F (36.4 C), temperature source Oral, resp. rate 14, SpO2 100 %, unknown if currently breastfeeding. Physical Exam  Constitutional: She appears well-developed and well-nourished.  HENT:  Head: Normocephalic.  Eyes: Pupils are equal, round, and reactive to light.  Neck: Normal range of motion. Neck supple.  Cardiovascular: Normal rate and regular rhythm.   Respiratory: Effort normal and breath sounds normal.  GI: There is tenderness. There is positive Murphy's sign.  Neurological: She is alert.  Skin: Skin is warm and dry.  Psychiatric: She has a normal mood and affect. Her behavior is normal. Judgment and thought content normal.     Assessment/Plan Acute cholecystitis history of gallstone pancreatitis Admit IVF ABX Will need lap chole during this admission  Jamoni Broadfoot A. 12/13/2014, 5:16 AM

## 2014-12-13 NOTE — Anesthesia Postprocedure Evaluation (Signed)
  Anesthesia Post-op Note  Patient: Mackenzie CollardBrittany L Frommelt  Procedure(s) Performed: Procedure(s): LAPAROSCOPIC CHOLECYSTECTOMY WITH INTRAOPERATIVE CHOLANGIOGRAM (N/A)  Patient Location: PACU  Anesthesia Type:General  Level of Consciousness: awake and alert   Airway and Oxygen Therapy: Patient Spontanous Breathing  Post-op Pain: mild  Post-op Assessment: Post-op Vital signs reviewed, Patient's Cardiovascular Status Stable and Respiratory Function Stable  Post-op Vital Signs: Reviewed  Filed Vitals:   12/13/14 1256  BP:   Pulse:   Temp: 36.4 C  Resp: 15    Complications: No apparent anesthesia complications

## 2014-12-13 NOTE — Op Note (Signed)
Laparoscopic Cholecystectomy with IOC Procedure Note  Indications: This patient presents with symptomatic gallbladder disease and will undergo laparoscopic cholecystectomy.  Pre-operative Diagnosis: Calculus of gallbladder with acute cholecystitis, without mention of obstruction  Post-operative Diagnosis: Same  Surgeon: Lavanya Roa K.   Assistants: none  Anesthesia: General endotracheal anesthesia  ASA Class: 2E  Procedure Details  The patient was seen again in the Holding Room. The risks, benefits, complications, treatment options, and expected outcomes were discussed with the patient. The possibilities of reaction to medication, pulmonary aspiration, perforation of viscus, bleeding, recurrent infection, finding a normal gallbladder, the need for additional procedures, failure to diagnose a condition, the possible need to convert to an open procedure, and creating a complication requiring transfusion or operation were discussed with the patient. The likelihood of improving the patient's symptoms with return to their baseline status is good.  The patient and/or family concurred with the proposed plan, giving informed consent. The site of surgery properly noted. The patient was taken to Operating Room, identified as [name] and the procedure verified as Laparoscopic Cholecystectomy with Intraoperative Cholangiogram. A Time Out was held and the above information confirmed.  Prior to the induction of general anesthesia, antibiotic prophylaxis was administered. General endotracheal anesthesia was then administered and tolerated well. After the induction, the abdomen was prepped with Chloraprep and draped in the sterile fashion. The patient was positioned in the supine position.  Local anesthetic agent was injected into the skin near the umbilicus and an incision made. We dissected down to the abdominal fascia with blunt dissection.  The fascia was incised vertically and we entered the  peritoneal cavity bluntly.  A pursestring suture of 0-Vicryl was placed around the fascial opening.  The Hasson cannula was inserted and secured with the stay suture.  Pneumoperitoneum was then created with CO2 and tolerated well without any adverse changes in the patient's vital signs. An 11-mm port was placed in the subxiphoid position.  Two 5-mm ports were placed in the right upper quadrant. All skin incisions were infiltrated with a local anesthetic agent before making the incision and placing the trocars.   We positioned the patient in reverse Trendelenburg, tilted slightly to the patient's left.  The gallbladder was identified, the fundus grasped and retracted cephalad. Adhesions were lysed bluntly and with the electrocautery where indicated, taking care not to injure any adjacent organs or viscus. The infundibulum was grasped and retracted laterally, exposing the peritoneum overlying the triangle of Calot. This was then divided and exposed in a blunt fashion. A critical view of the cystic duct and cystic artery was obtained.  The cystic duct was clearly identified and bluntly dissected circumferentially. The cystic duct was ligated with a clip distally.   An incision was made in the cystic duct and the [facility] cholangiogram catheter introduced. The catheter was secured using a clip. A cholangiogram was then obtained which showed good visualization of the distal and proximal biliary tree with no sign of filling defects or obstruction.  Contrast flowed easily into the duodenum. The catheter was then removed.   The cystic duct was then ligated with clips and divided. The cystic artery was identified, dissected free, ligated with clips and divided as well.   The gallbladder was dissected from the liver bed in retrograde fashion with the electrocautery. The gallbladder was removed and placed in an Endocatch sac. The liver bed was irrigated and inspected. Hemostasis was achieved with the electrocautery and  Surgicel SNOW. Copious irrigation was utilized and was repeatedly  aspirated until clear.  The gallbladder and Endocatch sac were then removed through the umbilical port site.  The pursestring suture was used to close the umbilical fascia.    We again inspected the right upper quadrant for hemostasis.  Pneumoperitoneum was released as we removed the trocars.  4-0 Monocryl was used to close the skin.   Benzoin, steri-strips, and clean dressings were applied. The patient was then extubated and brought to the recovery room in stable condition. Instrument, sponge, and needle counts were correct at closure and at the conclusion of the case.   Findings: Cholecystitis with Cholelithiasis  Estimated Blood Loss: less than 50 mL         Drains: none         Specimens: Gallbladder           Complications: None; patient tolerated the procedure well.         Disposition: PACU - hemodynamically stable.         Condition: stable   Wilmon ArmsMatthew K. Corliss Skainssuei, MD, [facility]  General/ Trauma Surgery  12/13/2014 11:42 AM

## 2014-12-14 MED ORDER — HYDROCODONE-ACETAMINOPHEN 5-325 MG PO TABS: 1.0000 | ORAL_TABLET | Freq: Four times a day (QID) | ORAL | Status: DC | PRN

## 2014-12-14 MED ORDER — DOCUSATE SODIUM 100 MG PO CAPS
100.0000 mg | ORAL_CAPSULE | Freq: Two times a day (BID) | ORAL | Status: DC
  Administered 2014-12-14: 100 mg via ORAL
  Filled 2014-12-14: qty 1

## 2014-12-14 NOTE — Discharge Instructions (Signed)

## 2014-12-14 NOTE — Discharge Summary (Signed)
Physician Discharge Summary  Patient ID: [name] MRN: [id number] DOB/AGE: 27/09/1985 28 y.o.  Admit date: 12/12/2014 Discharge date: 12/14/2014  Admission Diagnoses:  Acute cholecystitis with past history of gallstone pancreatitis  Discharge Diagnoses:  same  Active Problems:   Acute cholecystitis   Surgery:  Lap chole with IOC  Discharged Condition: improved  Hospital Course:   Patient admitted and had same day lap chole with IOC by Dr. Corliss Skainssuei.  Did well overnight and is ready for discharge.  Incisions OK.   Consults: none  Significant Diagnostic Studies: IOC    Discharge Exam: Blood pressure 112/60, pulse 87, temperature 98.6 F (37 C), temperature source Oral, resp. rate 16, SpO2 100 %, unknown if currently breastfeeding. Incisions ok.  Minimal pain  Disposition: 01-Home or Self Care  Discharge Instructions    Diet - low sodium heart healthy    Complete by:  As directed      Increase activity slowly    Complete by:  As directed      No wound care    Complete by:  As directed             Medication List    STOP taking these medications        acetaminophen 500 MG tablet  Commonly known as:  TYLENOL     clindamycin 150 MG capsule  Commonly known as:  CLEOCIN     oxyCODONE-acetaminophen 5-325 MG per tablet  Commonly known as:  PERCOCET/ROXICET     traMADol 50 MG tablet  Commonly known as:  ULTRAM      TAKE these medications        albuterol 108 (90 BASE) MCG/ACT inhaler  Commonly known as:  PROVENTIL HFA;VENTOLIN HFA  Inhale 2 puffs into the lungs every 6 (six) hours as needed for wheezing or shortness of breath. Patient stated needs  new prescription     busPIRone 10 MG tablet  Commonly known as:  BUSPAR  Take 10 mg by mouth 2 (two) times daily.     chlorhexidine 0.12 % solution  Commonly known as:  PERIDEX  Swish and spit 10 mLs every morning.     diphenhydrAMINE 25 MG tablet  Commonly known as:  BENADRYL  Take 25 mg by mouth every 6  (six) hours as needed for itching or allergies (allergies).     ferrous sulfate 325 (65 FE) MG tablet  Take 1 tablet (325 mg total) by mouth daily with breakfast.     HYDROcodone-acetaminophen 5-325 MG per tablet  Commonly known as:  NORCO/VICODIN  Take 1 tablet by mouth every 6 (six) hours as needed.     ibuprofen 200 MG tablet  Commonly known as:  ADVIL,MOTRIN  Take 400 mg by mouth every 6 (six) hours as needed for moderate pain.     medroxyPROGESTERone 150 MG/ML injection  Commonly known as:  DEPO-PROVERA  Inject 150 mg into the muscle every 3 (three) months.     methocarbamol 500 MG tablet  Commonly known as:  ROBAXIN  Take 1 tablet (500 mg total) by mouth 2 (two) times daily.     naproxen 500 MG tablet  Commonly known as:  NAPROSYN  Take 1 tablet (500 mg total) by mouth 2 (two) times daily.     OXcarbazepine 150 MG tablet  Commonly known as:  TRILEPTAL  Take 150 mg by mouth 2 (two) times daily.     prenatal multivitamin Tabs tablet  Take 1 tablet by mouth daily.  sertraline 25 MG tablet  Commonly known as:  ZOLOFT  Take 25 mg by mouth daily.           Follow-up Information    Follow up with TSUEI,Denzell Colasanti K., MD In 3 weeks.   Specialty:  General Surgery   Contact information:   [address] [place] [address] [phone]       Signed: Valarie Merino 12/14/2014, 9:24 AM

## 2014-12-14 NOTE — Progress Notes (Signed)
Patient ready for discharge home; discharge instructions given and reviewed with patient; Rx given and reviewed; patient discharged via wheelchair accompanied by her family member.

## 2014-12-16 ENCOUNTER — Encounter (HOSPITAL_COMMUNITY): Payer: Self-pay | Admitting: Surgery

## 2014-12-17 ENCOUNTER — Emergency Department (HOSPITAL_COMMUNITY)
Admission: EM | Admit: 2014-12-17 | Discharge: 2014-12-18 | Disposition: A | Discharge status: Home / Self Care | Payer: Medicaid Other | Attending: Emergency Medicine | Admitting: Emergency Medicine

## 2014-12-17 ENCOUNTER — Encounter (HOSPITAL_COMMUNITY): Payer: Self-pay | Admitting: Emergency Medicine

## 2014-12-17 DIAGNOSIS — G8918 Other acute postprocedural pain: Secondary | ICD-10-CM | POA: Insufficient documentation

## 2014-12-17 DIAGNOSIS — D649 Anemia, unspecified: Secondary | ICD-10-CM | POA: Diagnosis not present

## 2014-12-17 DIAGNOSIS — R110 Nausea: Secondary | ICD-10-CM | POA: Diagnosis not present

## 2014-12-17 DIAGNOSIS — Z791 Long term (current) use of non-steroidal anti-inflammatories (NSAID): Secondary | ICD-10-CM | POA: Diagnosis not present

## 2014-12-17 DIAGNOSIS — Z8719 Personal history of other diseases of the digestive system: Secondary | ICD-10-CM | POA: Diagnosis not present

## 2014-12-17 DIAGNOSIS — R1010 Upper abdominal pain, unspecified: Secondary | ICD-10-CM | POA: Diagnosis present

## 2014-12-17 DIAGNOSIS — F329 Major depressive disorder, single episode, unspecified: Secondary | ICD-10-CM | POA: Diagnosis not present

## 2014-12-17 DIAGNOSIS — Z9049 Acquired absence of other specified parts of digestive tract: Secondary | ICD-10-CM | POA: Diagnosis not present

## 2014-12-17 DIAGNOSIS — J45909 Unspecified asthma, uncomplicated: Secondary | ICD-10-CM | POA: Insufficient documentation

## 2014-12-17 DIAGNOSIS — Z3202 Encounter for pregnancy test, result negative: Secondary | ICD-10-CM | POA: Diagnosis not present

## 2014-12-17 DIAGNOSIS — Z8619 Personal history of other infectious and parasitic diseases: Secondary | ICD-10-CM | POA: Diagnosis not present

## 2014-12-17 DIAGNOSIS — Z87440 Personal history of urinary (tract) infections: Secondary | ICD-10-CM | POA: Insufficient documentation

## 2014-12-17 DIAGNOSIS — Z720 Tobacco use: Secondary | ICD-10-CM | POA: Diagnosis not present

## 2014-12-17 DIAGNOSIS — Z79899 Other long term (current) drug therapy: Secondary | ICD-10-CM | POA: Diagnosis not present

## 2014-12-17 LAB — URINALYSIS, ROUTINE W REFLEX MICROSCOPIC
BILIRUBIN URINE: NEGATIVE
Glucose, UA: NEGATIVE mg/dL
Hgb urine dipstick: NEGATIVE
Ketones, ur: NEGATIVE mg/dL
Leukocytes, UA: NEGATIVE
NITRITE: NEGATIVE
PROTEIN: NEGATIVE mg/dL
SPECIFIC GRAVITY, URINE: 1.027 (ref 1.005–1.030)
UROBILINOGEN UA: 0.2 mg/dL (ref 0.0–1.0)
pH: 5.5 (ref 5.0–8.0)

## 2014-12-17 LAB — LIPASE, BLOOD: Lipase: 23 U/L (ref 22–51)

## 2014-12-17 LAB — COMPREHENSIVE METABOLIC PANEL
ALK PHOS: 62 U/L (ref 38–126)
ALT: 30 U/L (ref 14–54)
AST: 14 U/L — ABNORMAL LOW (ref 15–41)
Albumin: 3.9 g/dL (ref 3.5–5.0)
Anion gap: 8 (ref 5–15)
BUN: 13 mg/dL (ref 6–20)
CALCIUM: 9 mg/dL (ref 8.9–10.3)
CO2: 23 mmol/L (ref 22–32)
Chloride: 106 mmol/L (ref 101–111)
Creatinine, Ser: 0.68 mg/dL (ref 0.44–1.00)
GFR calc Af Amer: >60 mL/min (ref >60)
GFR calc non Af Amer: >60 mL/min (ref >60)
GLUCOSE: 100 mg/dL — AB (ref 70–99)
Potassium: 3.9 mmol/L (ref 3.5–5.1)
SODIUM: 137 mmol/L (ref 135–145)
Total Bilirubin: 0.5 mg/dL (ref 0.3–1.2)
Total Protein: 6.9 g/dL (ref 6.5–8.1)

## 2014-12-17 LAB — CBC WITH DIFFERENTIAL/PLATELET
Basophils Absolute: 0 10*3/uL (ref 0.0–0.1)
Basophils Relative: 0 % (ref 0–1)
EOS ABS: 0.3 10*3/uL (ref 0.0–0.7)
Eosinophils Relative: 4 % (ref 0–5)
HCT: 32.7 % — ABNORMAL LOW (ref 36.0–46.0)
HEMOGLOBIN: 10.6 g/dL — AB (ref 12.0–15.0)
Lymphocytes Relative: 50 % — ABNORMAL HIGH (ref 12–46)
Lymphs Abs: 3.4 10*3/uL (ref 0.7–4.0)
MCH: 26.8 pg (ref 26.0–34.0)
MCHC: 32.4 g/dL (ref 30.0–36.0)
MCV: 82.8 fL (ref 78.0–100.0)
MONO ABS: 0.3 10*3/uL (ref 0.1–1.0)
MONOS PCT: 4 % (ref 3–12)
Neutro Abs: 2.8 10*3/uL (ref 1.7–7.7)
Neutrophils Relative %: 42 % — ABNORMAL LOW (ref 43–77)
Platelets: 286 10*3/uL (ref 150–400)
RBC: 3.95 MIL/uL (ref 3.87–5.11)
RDW: 14.5 % (ref 11.5–15.5)
WBC: 6.8 10*3/uL (ref 4.0–10.5)

## 2014-12-17 LAB — POC URINE PREG, ED: PREG TEST UR: NEGATIVE

## 2014-12-17 MED ORDER — MORPHINE SULFATE 4 MG/ML IJ SOLN
4.0000 mg | Freq: Once | INTRAMUSCULAR | Status: AC
  Administered 2014-12-18: 4 mg via INTRAVENOUS
  Filled 2014-12-17: qty 1

## 2014-12-17 MED ORDER — ONDANSETRON HCL 4 MG/2ML IJ SOLN
4.0000 mg | Freq: Once | INTRAMUSCULAR | Status: AC
  Administered 2014-12-18: 4 mg via INTRAVENOUS
  Filled 2014-12-17: qty 2

## 2014-12-17 NOTE — ED Notes (Signed)
Abdomen is soft, slightly tender to touch, no rigidity noted. Pt reports some nausea

## 2014-12-17 NOTE — ED Notes (Signed)
Grandpa agitated at the nursing station, wants care given now.

## 2014-12-17 NOTE — ED Provider Notes (Signed)
CSN: [id number]     Arrival date & time 12/17/14  1910 History   First MD Initiated Contact with Patient 12/17/14 2309     Chief Complaint  Patient presents with  . Abdominal Pain     (Consider location/radiation/quality/duration/timing/severity/associated sxs/prior Treatment) HPI 29 year old female with recently status post laparoscopic cholecystectomy 12/13/14, who presents the ER complaining of upper abdominal pain. Patient states her pain is consistent with previous pain experienced consistent with pancreatitis. Patient states she had a pancreatitis secondary to a obstructed gallstone previously. Patient states she went to her PCP, then 2 [facility] surgery earlier today for this same pain. [facility] recommended patient follow-up in the ER for further evaluation. Patient describes her pain as a sharp pain in her upper abdomen, radiates to the right upper quadrant. Patient reports associated nausea without vomiting. Patient denies fever, chills, diarrhea, constipation, anorexia, dysuria.  Past Medical History  Diagnosis Date  . Asthma   . Headache(784.0)     migraines  . Depression   . PIH (pregnancy induced hypertension)   . Late prenatal care     22wks  . Anemia 2007  . HPV (human papilloma virus) anogenital infection   . Recurrent UTI   . Abnormal Pap smear   . Chlamydia infection     has no taken treatment as of 01/03/2013  . SVD (spontaneous vaginal delivery) 01/08/2013  . Vaginal Pap smear, abnormal   . Twin pregnancy in third trimester 08/08/2014  . Pancreatitis, acute 08/08/2014  . Pregnancy induced hypertension   . Acute gallstone pancreatitis 08/30/2014   Past Surgical History  Procedure Laterality Date  . Vaginal delivery N/A 09/18/2014    Procedure: VAGINAL DELIVERY;  Surgeon: Lavina Hamman, MD;  Location: [facility];  Service: Obstetrics;  Laterality: N/A;  . Cholecystectomy  12/13/2014  . Cholecystectomy N/A 12/13/2014    Procedure: LAPAROSCOPIC CHOLECYSTECTOMY WITH  INTRAOPERATIVE CHOLANGIOGRAM;  Surgeon: Manus Rudd, MD;  Location: [facility];  Service: General;  Laterality: N/A;   Family History  Problem Relation Age of Onset  . Anesthesia problems Neg Hx   . Hypotension Neg Hx   . Malignant hyperthermia Neg Hx   . Pseudochol deficiency Neg Hx   . Heart disease Maternal Grandmother     clots in heart  . Stroke Maternal Grandmother   . Diabetes Mother   . Heart disease Maternal Grandfather     pacemaker  . Miscarriages / Stillbirths Paternal Aunt   . Stroke Paternal Grandmother    History  Substance Use Topics  . Smoking status: Current Every Day Smoker -- 0.25 packs/day for 5 years    Types: Cigarettes  . Smokeless tobacco: Never Used  . Alcohol Use: No   OB History    Gravida Para Term Preterm AB TAB SAB Ectopic Multiple Living   0 2 0 1 5     Review of Systems  Constitutional: Negative for fever.  HENT: Negative for trouble swallowing.   Eyes: Negative for visual disturbance.  Respiratory: Negative for shortness of breath.   Cardiovascular: Negative for chest pain.  Gastrointestinal: Positive for nausea and abdominal pain. Negative for vomiting.  Genitourinary: Negative for dysuria.  Musculoskeletal: Negative for neck pain.  Skin: Negative for rash.  Neurological: Negative for dizziness, weakness and numbness.  Psychiatric/Behavioral: Negative.       Allergies  Corn syrup and Tramadol  Home Medications   Prior to Admission medications   Medication Sig Start Date End Date Taking? Authorizing Provider  albuterol (PROVENTIL HFA;VENTOLIN HFA) 108 (90 BASE) MCG/ACT inhaler Inhale 2 puffs into the lungs every 6 (six) hours as needed for wheezing or shortness of breath. Patient stated needs  new prescription    Historical Provider, MD  busPIRone (BUSPAR) 10 MG tablet Take 10 mg by mouth 2 (two) times daily.    Historical Provider, MD  chlorhexidine (PERIDEX) 0.12 % solution Swish and spit 10 mLs every morning.  04/24/14    Historical Provider, MD  diphenhydrAMINE (BENADRYL) 25 MG tablet Take 25 mg by mouth every 6 (six) hours as needed for itching or allergies (allergies).     Historical Provider, MD  ferrous sulfate 325 (65 FE) MG tablet Take 1 tablet (325 mg total) by mouth daily with breakfast. Patient taking differently: Take 325 mg by mouth 3 (three) times daily with meals.  08/10/14   Huel CoteKathy Richardson, MD  HYDROcodone-acetaminophen (NORCO/VICODIN) 5-325 MG per tablet Take 1 tablet by mouth every 6 (six) hours as needed. 12/14/14   Luretha MurphyMatthew Martin, MD  ibuprofen (ADVIL,MOTRIN) 200 MG tablet Take 400 mg by mouth every 6 (six) hours as needed for moderate pain.    Historical Provider, MD  medroxyPROGESTERone (DEPO-PROVERA) 150 MG/ML injection Inject 150 mg into the muscle every 3 (three) months.    Historical Provider, MD  methocarbamol (ROBAXIN) 500 MG tablet Take 1 tablet (500 mg total) by mouth 2 (two) times daily. 10/18/14   Heather Laisure, PA-C  naproxen (NAPROSYN) 500 MG tablet Take 1 tablet (500 mg total) by mouth 2 (two) times daily. 10/18/14   Heather Laisure, PA-C  OXcarbazepine (TRILEPTAL) 150 MG tablet Take 150 mg by mouth 2 (two) times daily.    Historical Provider, MD  Prenatal Vit-Fe Fumarate-FA (PRENATAL MULTIVITAMIN) TABS tablet Take 1 tablet by mouth daily.     Historical Provider, MD  sertraline (ZOLOFT) 25 MG tablet Take 25 mg by mouth daily.    Historical Provider, MD   BP 131/72 mmHg  Pulse 70  Temp(Src) 97.9 F (36.6 C)  Resp 18  Ht 5\' 7"  (1.702 m)  Wt 185 lb (83.915 kg)  BMI 28.97 kg/m2  SpO2 99% Physical Exam  Constitutional: She is oriented to person, place, and time. She appears well-developed and well-nourished. No distress.  HENT:  Head: Normocephalic and atraumatic.  Mouth/Throat: Oropharynx is clear and moist. No oropharyngeal exudate.  Eyes: Right eye exhibits no discharge. Left eye exhibits no discharge. No scleral icterus.  Neck: Normal range of motion.  Cardiovascular:  Normal rate, regular rhythm and normal heart sounds.   No murmur heard. Pulmonary/Chest: Effort normal and breath sounds normal. No respiratory distress.  Abdominal: Soft. Normal appearance and bowel sounds are normal. There is tenderness in the right upper quadrant and epigastric area. There is no rigidity, no guarding, no tenderness at McBurney's point and negative Murphy's sign.    Periumbilical and right upper quadrant, there are several small, surgical incisions noted. These incisions have mild green colored ecchymosis consistent with postop day 5 ecchymosis. There is no erythema, edema, warmth, purulent discharge or drainage. There are no signs of infection or dehiscence.  Musculoskeletal: Normal range of motion. She exhibits no edema or tenderness.  Neurological: She is alert and oriented to person, place, and time. No cranial nerve deficit. Coordination normal.  Skin: Skin is warm and dry. No rash noted. She is not diaphoretic.  Psychiatric: She has a normal mood and affect.  Nursing note and vitals reviewed.   ED Course  Procedures (including critical care time)  Labs Review Labs Reviewed  CBC WITH DIFFERENTIAL/PLATELET - Abnormal; Notable for the following:    Hemoglobin 10.6 (*)    HCT 32.7 (*)    Neutrophils Relative % 42 (*)    Lymphocytes Relative 50 (*)    All other components within normal limits  COMPREHENSIVE METABOLIC PANEL - Abnormal; Notable for the following:    Glucose, Bld 100 (*)    AST 14 (*)    All other components within normal limits  LIPASE, BLOOD  URINALYSIS, ROUTINE W REFLEX MICROSCOPIC  POC URINE PREG, ED    Imaging Review No results found.   EKG Interpretation None      MDM   Final diagnoses:  Upper abdominal pain    29 year old female status post laparoscopic cholecystectomy 12/13/14, seen by [facility] today for sudden onset of upper abdominal pain starting this morning with mild nausea. Surgery recommended patient be sent here for further  evaluation. Will follow up with surgery for specific recommendations. Spoke with Dr. Luisa Hart with general surgery in consultation patient's case, Dr. Luisa Hart recommends following up with CT abdomen pelvis for further evaluation. Patient is well-appearing, hemodynamically stable and in no acute distress. Patient does not meet sepsis or SIRS criteria at this time. Dr. Luisa Hart recommends if CT abdomen pelvis is unremarkable for acute pathology patient can most likely be discharged home with symptomatic therapy as needed, and to follow-up with Dr. Corliss Skains, who is seeing her as outpatient postop. Patient signed out to Elpidio Anis, PA-C to follow-up on results of CT abdomen pelvis.  Signed,  Ladona Mow, PA-C 1:50 AM   Ladona Mow, PA-C 12/18/14 1610  Jerelyn Scott, MD 12/18/14 [phone]

## 2014-12-17 NOTE — ED Notes (Signed)
Pt's family member approached nurses station upset about pt's wait time; family member is yelling; MD and PA notified; family advised per PA and MD they will have to evaluate pt first and will be in as soon as possible; Consulting civil engineerCharge RN speaking with family member at present; Family member unsatisfied;

## 2014-12-17 NOTE — ED Notes (Signed)
PA-C at bedside 

## 2014-12-17 NOTE — ED Notes (Signed)
Consulting civil engineerCharge RN and security called as family member persisted yelling at staff.

## 2014-12-17 NOTE — ED Notes (Signed)
Pt. reports mid abdominal pain with nausea and emesis onset yesterday unrelieved by prescription pain medication , pt. had a laparoscopic cholecystectomy last 12/13/2014 by Dr. Corliss Skainssuei . Denies fever or chills.

## 2014-12-18 ENCOUNTER — Emergency Department (HOSPITAL_COMMUNITY): Payer: Medicaid Other

## 2014-12-18 MED ORDER — IOHEXOL 300 MG/ML  SOLN
100.0000 mL | Freq: Once | INTRAMUSCULAR | Status: AC | PRN
  Administered 2014-12-18: 100 mL via INTRAVENOUS

## 2014-12-18 MED ORDER — HYDROMORPHONE HCL 1 MG/ML IJ SOLN
0.5000 mg | Freq: Once | INTRAMUSCULAR | Status: AC
  Administered 2014-12-18: 0.5 mg via INTRAVENOUS
  Filled 2014-12-18: qty 1

## 2014-12-18 MED ORDER — IOHEXOL 300 MG/ML  SOLN: 25.0000 mL | Freq: Once | INTRAMUSCULAR | Status: DC | PRN

## 2014-12-18 MED ORDER — IOHEXOL 300 MG/ML  SOLN
50.0000 mL | Freq: Once | INTRAMUSCULAR | Status: AC | PRN
  Administered 2014-12-18: 50 mL via ORAL

## 2014-12-18 NOTE — Discharge Instructions (Signed)
CALL YOUR SURGEON IN THE MORNING TO SCHEDULE A TIME TO BE SEEN FOR RECHECK. CONTINUE YOUR CURRENT PAIN MEDICATION AS PRESCRIBED. RETURN TO THE EMERGENCY DEPARTMENT IF YOU HAVE ANY HIGH FEVER, UNCONTROLLED VOMITING OR NEW CONCERN.

## 2014-12-18 NOTE — ED Provider Notes (Signed)
4/29 cholecystectomy Pain today No fever, nausea w/o vomiting Pending CT  CT results show no acute post-operative changes. The patient is sleeping on re-evaluation and appears comfortable when woken up to discuss CT results. Stable for discharge home with outpatient follow up with her surgeon.  Elpidio AnisShari Naama Sappington, PA-C 12/19/14 0023  Jerelyn ScottMartha Linker, MD 12/19/14 [phone]

## 2014-12-18 NOTE — ED Notes (Signed)
Pt tolerating CT contrast; pt drinking on second cup

## 2014-12-23 ENCOUNTER — Inpatient Hospital Stay (HOSPITAL_COMMUNITY)
Admission: AD | Admit: 2014-12-23 | Discharge: 2014-12-23 | Disposition: A | Discharge status: Home / Self Care | Payer: Medicaid Other | Source: Ambulatory Visit | Attending: Obstetrics and Gynecology | Admitting: Obstetrics and Gynecology

## 2014-12-23 DIAGNOSIS — N946 Dysmenorrhea, unspecified: Secondary | ICD-10-CM | POA: Insufficient documentation

## 2014-12-23 DIAGNOSIS — Z87440 Personal history of urinary (tract) infections: Secondary | ICD-10-CM | POA: Insufficient documentation

## 2014-12-23 DIAGNOSIS — R109 Unspecified abdominal pain: Secondary | ICD-10-CM | POA: Insufficient documentation

## 2014-12-23 DIAGNOSIS — R1084 Generalized abdominal pain: Secondary | ICD-10-CM

## 2014-12-23 DIAGNOSIS — F17210 Nicotine dependence, cigarettes, uncomplicated: Secondary | ICD-10-CM | POA: Diagnosis not present

## 2014-12-23 LAB — URINE MICROSCOPIC-ADD ON

## 2014-12-23 LAB — URINALYSIS, ROUTINE W REFLEX MICROSCOPIC
BILIRUBIN URINE: NEGATIVE
Glucose, UA: NEGATIVE mg/dL
Ketones, ur: NEGATIVE mg/dL
Leukocytes, UA: NEGATIVE
Nitrite: NEGATIVE
PH: 5.5 (ref 5.0–8.0)
Protein, ur: NEGATIVE mg/dL
Specific Gravity, Urine: 1.02 (ref 1.005–1.030)
Urobilinogen, UA: 0.2 mg/dL (ref 0.0–1.0)

## 2014-12-23 LAB — POCT PREGNANCY, URINE: Preg Test, Ur: NEGATIVE

## 2014-12-23 MED ORDER — OXYCODONE-ACETAMINOPHEN 5-325 MG PO TABS
2.0000 | ORAL_TABLET | Freq: Once | ORAL | Status: AC
  Administered 2014-12-23: 2 via ORAL
  Filled 2014-12-23: qty 2

## 2014-12-23 NOTE — MAU Provider Note (Signed)
History   [id number] not pregnant on DMPA as contraceptive in with c/o bleeding and cramping that started today.  CSN: [id number]  Arrival date and time: 12/23/14 1458   None     Chief Complaint  Patient presents with  . Abdominal Cramping   HPI  OB History    Gravida Para Term Preterm AB TAB SAB Ectopic Multiple Living   6 4 3 1 2  0 2 0 1 5      Past Medical History  Diagnosis Date  . Asthma   . Headache(784.0)     migraines  . Depression   . PIH (pregnancy induced hypertension)   . Late prenatal care     22wks  . Anemia 2007  . HPV (human papilloma virus) anogenital infection   . Recurrent UTI   . Abnormal Pap smear   . Chlamydia infection     has no taken treatment as of 01/03/2013  . SVD (spontaneous vaginal delivery) 01/08/2013  . Vaginal Pap smear, abnormal   . Twin pregnancy in third trimester 08/08/2014  . Pancreatitis, acute 08/08/2014  . Pregnancy induced hypertension   . Acute gallstone pancreatitis 08/30/2014    Past Surgical History  Procedure Laterality Date  . Vaginal delivery N/A 09/18/2014    Procedure: VAGINAL DELIVERY;  Surgeon: Lavina Hammanodd Meisinger, MD;  Location: [facility];  Service: Obstetrics;  Laterality: N/A;  . Cholecystectomy  12/13/2014  . Cholecystectomy N/A 12/13/2014    Procedure: LAPAROSCOPIC CHOLECYSTECTOMY WITH INTRAOPERATIVE CHOLANGIOGRAM;  Surgeon: Manus RuddMatthew Tsuei, MD;  Location: [facility];  Service: General;  Laterality: N/A;    Family History  Problem Relation Age of Onset  . Anesthesia problems Neg Hx   . Hypotension Neg Hx   . Malignant hyperthermia Neg Hx   . Pseudochol deficiency Neg Hx   . Heart disease Maternal Grandmother     clots in heart  . Stroke Maternal Grandmother   . Diabetes Mother   . Heart disease Maternal Grandfather     pacemaker  . Miscarriages / Stillbirths Paternal Aunt   . Stroke Paternal Grandmother     History  Substance Use Topics  . Smoking status: Current Every Day Smoker -- 0.25 packs/day for 5 years   Types: Cigarettes  . Smokeless tobacco: Never Used  . Alcohol Use: No    Allergies:  Allergies  Allergen Reactions  . Corn Syrup Anaphylaxis and Hives  . Tramadol Diarrhea and Itching    Prescriptions prior to admission  Medication Sig Dispense Refill Last Dose  . albuterol (PROVENTIL HFA;VENTOLIN HFA) 108 (90 BASE) MCG/ACT inhaler Inhale 2 puffs into the lungs every 6 (six) hours as needed for wheezing or shortness of breath. Patient stated needs  new prescription   unknown  . busPIRone (BUSPAR) 10 MG tablet Take 10 mg by mouth 2 (two) times daily.   12/17/2014 at Unknown time  . chlorhexidine (PERIDEX) 0.12 % solution Swish and spit 10 mLs every morning.   0 12/17/2014 at Unknown time  . diphenhydrAMINE (BENADRYL) 25 MG tablet Take 25 mg by mouth every 6 (six) hours as needed for itching or allergies (allergies).    unknown  . ferrous sulfate 325 (65 FE) MG tablet Take 1 tablet (325 mg total) by mouth daily with breakfast. (Patient taking differently: Take 325 mg by mouth 3 (three) times daily with meals. ) 30 tablet 3 12/17/2014 at Unknown time  . HYDROcodone-acetaminophen (NORCO/VICODIN) 5-325 MG per tablet Take 1 tablet by mouth every 6 (six) hours as needed. 20  tablet 0 12/17/2014 at Unknown time  . ibuprofen (ADVIL,MOTRIN) 200 MG tablet Take 400 mg by mouth every 6 (six) hours as needed for moderate pain.   12/17/2014 at Unknown time  . medroxyPROGESTERone (DEPO-PROVERA) 150 MG/ML injection Inject 150 mg into the muscle every 3 (three) months.   10/2014  . methocarbamol (ROBAXIN) 500 MG tablet Take 1 tablet (500 mg total) by mouth 2 (two) times daily. 20 tablet 0 1 week  . naproxen (NAPROSYN) 500 MG tablet Take 1 tablet (500 mg total) by mouth 2 (two) times daily. (Patient not taking: Reported on 12/18/2014) 30 tablet 0 Completed Course at Unknown time  . OXcarbazepine (TRILEPTAL) 150 MG tablet Take 150 mg by mouth 2 (two) times daily.   12/17/2014 at Unknown time  . sertraline (ZOLOFT) 25 MG tablet  Take 25 mg by mouth daily.   12/17/2014 at Unknown time    Review of Systems  Constitutional: Negative.   HENT: Negative.   Eyes: Negative.   Respiratory: Negative.   Cardiovascular: Negative.   Gastrointestinal: Positive for abdominal pain.  Genitourinary: Negative.   Musculoskeletal: Negative.   Skin: Negative.   Neurological: Negative.   Endo/Heme/Allergies: Negative.   Psychiatric/Behavioral: Negative.    Physical Exam   Blood pressure 122/77, pulse 99, temperature 98.6 F (37 C), temperature source Oral, resp. rate 16, height 5\' 5"  (1.651 m), weight 176 lb (79.833 kg), last menstrual period 12/20/2014, unknown if currently breastfeeding.  Physical Exam  Constitutional: She is oriented to person, place, and time. She appears well-developed and well-nourished.  HENT:  Head: Normocephalic.  Eyes: Pupils are equal, round, and reactive to light.  Neck: Normal range of motion.  Cardiovascular: Normal rate, regular rhythm, normal heart sounds and intact distal pulses.   Respiratory: Effort normal and breath sounds normal.  GI: Soft. Bowel sounds are normal.  Genitourinary: Vagina normal and uterus normal.  Scant amt vag bleeding  Musculoskeletal: Normal range of motion.  Neurological: She is alert and oriented to person, place, and time. She has normal reflexes.  Skin: Skin is warm and dry.  Psychiatric: She has a normal mood and affect. Her behavior is normal. Judgment and thought content normal.    MAU Course  Procedures  MDM Menses, dysmenorrhea  Assessment and Plan  Pain management and d/c home to follow up with Dr. Jackelyn KnifeMeisinger. Report to Dr. Jackelyn KnifeMeisinger.  Wyvonnia DuskyLAWSON, Zaray Gatchel DARLENE 12/23/2014, 3:41 PM

## 2014-12-23 NOTE — MAU Note (Signed)
woke up this morning cramping really bad, laid down for awhile. Around 1130, went to the bathroom and something came out (clear gooey and blue)  Since then the cramping is worse.  Had twins in Feb, Depo in March.  Started spotting 3 days ago.

## 2014-12-23 NOTE — Discharge Instructions (Signed)

## 2015-03-03 ENCOUNTER — Encounter (HOSPITAL_COMMUNITY): Payer: Self-pay

## 2015-03-03 ENCOUNTER — Encounter (HOSPITAL_COMMUNITY)
Admission: RE | Admit: 2015-03-03 | Discharge: 2015-03-03 | Disposition: A | Discharge status: Home / Self Care | Payer: Medicaid Other | Source: Ambulatory Visit | Attending: Obstetrics and Gynecology | Admitting: Obstetrics and Gynecology

## 2015-03-03 DIAGNOSIS — Z01818 Encounter for other preprocedural examination: Secondary | ICD-10-CM | POA: Insufficient documentation

## 2015-03-03 HISTORY — DX: Anxiety disorder, unspecified: F41.9

## 2015-03-03 LAB — TYPE AND SCREEN
ABO/RH(D): O POS
Antibody Screen: NEGATIVE

## 2015-03-03 LAB — CBC
HEMATOCRIT: 36.7 % (ref 36.0–46.0)
HEMOGLOBIN: 12.4 g/dL (ref 12.0–15.0)
MCH: 29.8 pg (ref 26.0–34.0)
MCHC: 33.8 g/dL (ref 30.0–36.0)
MCV: 88.2 fL (ref 78.0–100.0)
PLATELETS: 348 10*3/uL (ref 150–400)
RBC: 4.16 MIL/uL (ref 3.87–5.11)
RDW: 13.7 % (ref 11.5–15.5)
WBC: 7.8 10*3/uL (ref 4.0–10.5)

## 2015-03-03 NOTE — Patient Instructions (Addendum)
Your procedure is scheduled on:03/14/15  Enter through the Main Entrance at :6am  Pick up desk phone and dial [phone] and inform us of your arrival.  Please call [phone] if you have any problems the morning of surgery.  Remember: Do not eat food or drink liquids, including water, after midnight:Thursday 03/13/15 Clear liquids are ok until:   You may brush your teeth the morning of surgery.  Take these meds the morning of surgery with a sip of water: Buspar  DO NOT wear jewelry, eye make-up, lipstick,body lotion, or dark fingernail polish.  (Polished toes are ok) You may wear deodorant.  If you are to be admitted after surgery, leave suitcase in car until your room has been assigned. Patients discharged on the day of surgery will not be allowed to drive home. Wear loose fitting, comfortable clothes for your ride home.

## 2015-03-09 ENCOUNTER — Encounter (HOSPITAL_COMMUNITY): Payer: Self-pay | Admitting: Emergency Medicine

## 2015-03-09 ENCOUNTER — Emergency Department (HOSPITAL_COMMUNITY)
Admission: EM | Admit: 2015-03-09 | Discharge: 2015-03-09 | Disposition: A | Discharge status: Home / Self Care | Payer: Medicaid Other | Attending: Emergency Medicine | Admitting: Emergency Medicine

## 2015-03-09 DIAGNOSIS — J45909 Unspecified asthma, uncomplicated: Secondary | ICD-10-CM | POA: Diagnosis not present

## 2015-03-09 DIAGNOSIS — Z8719 Personal history of other diseases of the digestive system: Secondary | ICD-10-CM | POA: Diagnosis not present

## 2015-03-09 DIAGNOSIS — R040 Epistaxis: Secondary | ICD-10-CM | POA: Diagnosis not present

## 2015-03-09 DIAGNOSIS — Z720 Tobacco use: Secondary | ICD-10-CM | POA: Insufficient documentation

## 2015-03-09 DIAGNOSIS — J0100 Acute maxillary sinusitis, unspecified: Secondary | ICD-10-CM | POA: Diagnosis not present

## 2015-03-09 DIAGNOSIS — R220 Localized swelling, mass and lump, head: Secondary | ICD-10-CM | POA: Insufficient documentation

## 2015-03-09 DIAGNOSIS — F419 Anxiety disorder, unspecified: Secondary | ICD-10-CM | POA: Diagnosis not present

## 2015-03-09 DIAGNOSIS — G43909 Migraine, unspecified, not intractable, without status migrainosus: Secondary | ICD-10-CM | POA: Diagnosis not present

## 2015-03-09 DIAGNOSIS — D649 Anemia, unspecified: Secondary | ICD-10-CM | POA: Insufficient documentation

## 2015-03-09 DIAGNOSIS — Z79899 Other long term (current) drug therapy: Secondary | ICD-10-CM | POA: Insufficient documentation

## 2015-03-09 DIAGNOSIS — Z8679 Personal history of other diseases of the circulatory system: Secondary | ICD-10-CM | POA: Diagnosis not present

## 2015-03-09 DIAGNOSIS — Z8619 Personal history of other infectious and parasitic diseases: Secondary | ICD-10-CM | POA: Insufficient documentation

## 2015-03-09 DIAGNOSIS — Z7982 Long term (current) use of aspirin: Secondary | ICD-10-CM | POA: Insufficient documentation

## 2015-03-09 DIAGNOSIS — Z87440 Personal history of urinary (tract) infections: Secondary | ICD-10-CM | POA: Diagnosis not present

## 2015-03-09 DIAGNOSIS — R0981 Nasal congestion: Secondary | ICD-10-CM | POA: Diagnosis present

## 2015-03-09 DIAGNOSIS — F329 Major depressive disorder, single episode, unspecified: Secondary | ICD-10-CM | POA: Insufficient documentation

## 2015-03-09 MED ORDER — DOXYCYCLINE HYCLATE 100 MG PO CAPS: 100.0000 mg | ORAL_CAPSULE | Freq: Two times a day (BID) | ORAL | Status: DC

## 2015-03-09 MED ORDER — HYDROCODONE-ACETAMINOPHEN 5-325 MG PO TABS: 1.0000 | ORAL_TABLET | ORAL | Status: DC | PRN

## 2015-03-09 MED ORDER — IBUPROFEN 800 MG PO TABS: 800.0000 mg | ORAL_TABLET | Freq: Three times a day (TID) | ORAL | Status: DC | PRN

## 2015-03-09 NOTE — ED Provider Notes (Signed)
CSN: [id number]     Arrival date & time 03/09/15  0828 History  This chart was scribed for non-physician practitioner, Trixie Dredge, PA-C working with Richardean Canal, MD by Placido Sou, ED scribe. This patient was seen in room TR07C/TR07C and the patient's care was started at 9:32 AM.   Chief Complaint  Patient presents with  . Epistaxis  . Nasal Congestion   The history is provided by the patient. No language interpreter was used.    HPI Comments: [name] is a 29 y.o. female who presents to the Emergency Department complaining of a constant, mild, nose pain with onset 2 weeks ago and a worsening of her symptoms last night. Pt notes blowing her nose last night with a large amount of green mucous being discharged with a scab and then experiencing a mild nosebleed that is currently controlled. She describes her pain as sharp and notes taking an oxycodone last night and experiencing a relief of her symptoms. She further notes associated facial pain and swelling, trouble breathing through her nose and moderate, chronic, upper dental pain. Pt denies fever, chills, aches, sore throat, difficulty swallowing, n/v/d, leg swelling, SOB and CP.  Remote cocaine use, denies snorting any drugs recently.   Past Medical History  Diagnosis Date  . Asthma   . Headache(784.0)     migraines  . Depression   . PIH (pregnancy induced hypertension)   . Late prenatal care     22wks  . Anemia 2007  . HPV (human papilloma virus) anogenital infection   . Recurrent UTI   . Abnormal Pap smear   . Chlamydia infection     has no taken treatment as of 01/03/2013  . SVD (spontaneous vaginal delivery) 01/08/2013  . Vaginal Pap smear, abnormal   . Twin pregnancy in third trimester 08/08/2014  . Pancreatitis, acute 08/08/2014  . Pregnancy induced hypertension   . Acute gallstone pancreatitis 08/30/2014  . Anxiety    Past Surgical History  Procedure Laterality Date  . Vaginal delivery N/A 09/18/2014    Procedure:  VAGINAL DELIVERY;  Surgeon: Lavina Hamman, MD;  Location: [facility];  Service: Obstetrics;  Laterality: N/A;  . Cholecystectomy  12/13/2014  . Cholecystectomy N/A 12/13/2014    Procedure: LAPAROSCOPIC CHOLECYSTECTOMY WITH INTRAOPERATIVE CHOLANGIOGRAM;  Surgeon: Manus Rudd, MD;  Location: [facility];  Service: General;  Laterality: N/A;   Family History  Problem Relation Age of Onset  . Anesthesia problems Neg Hx   . Hypotension Neg Hx   . Malignant hyperthermia Neg Hx   . Pseudochol deficiency Neg Hx   . Heart disease Maternal Grandmother     clots in heart  . Stroke Maternal Grandmother   . Diabetes Mother   . Heart disease Maternal Grandfather     pacemaker  . Miscarriages / Stillbirths Paternal Aunt   . Stroke Paternal Grandmother    History  Substance Use Topics  . Smoking status: Current Every Day Smoker -- 0.25 packs/day for 5 years    Types: Cigarettes  . Smokeless tobacco: Never Used  . Alcohol Use: No   OB History    Gravida Para Term Preterm AB TAB SAB Ectopic Multiple Living   0 2 0 1 5     Review of Systems  Constitutional: Negative for fever and chills.  HENT: Positive for congestion, dental problem, facial swelling, nosebleeds and sinus pressure.   Respiratory: Negative for cough and shortness of breath.   Gastrointestinal: Negative for  nausea and vomiting.  Musculoskeletal: Negative for neck pain and neck stiffness.  Skin: Negative for color change and wound.  Allergic/Immunologic: Negative for immunocompromised state.  Neurological: Negative for light-headedness and headaches.  Psychiatric/Behavioral: Negative for self-injury.    Allergies  Corn syrup and Tramadol  Home Medications   Prior to Admission medications   Medication Sig Start Date End Date Taking? Authorizing Provider  acetaminophen (TYLENOL) 500 MG tablet Take 1,000 mg by mouth every 6 (six) hours as needed for mild pain or headache.    Historical Provider, MD  albuterol (PROVENTIL  HFA;VENTOLIN HFA) 108 (90 BASE) MCG/ACT inhaler Inhale 2 puffs into the lungs every 6 (six) hours as needed for wheezing or shortness of breath. Patient stated needs  new prescription    Historical Provider, MD  aspirin-acetaminophen-caffeine (EXCEDRIN MIGRAINE) [phone] MG per tablet Take 2 tablets by mouth every 6 (six) hours as needed for headache.    Historical Provider, MD  busPIRone (BUSPAR) 15 MG tablet Take 15 mg by mouth 3 (three) times daily.    Historical Provider, MD  diphenhydrAMINE (BENADRYL) 25 MG tablet Take 25 mg by mouth every 6 (six) hours as needed for itching or allergies (allergies).     Historical Provider, MD  diphenhydrAMINE (BENADRYL) 25 MG tablet Take 1 tablet by mouth daily as needed.    Historical Provider, MD  ferrous sulfate 325 (65 FE) MG tablet Take 1 tablet (325 mg total) by mouth daily with breakfast. Patient taking differently: Take 325 mg by mouth 3 (three) times daily with meals.  08/10/14   Huel Cote, MD  HYDROcodone-acetaminophen (NORCO/VICODIN) 5-325 MG per tablet Take 1 tablet by mouth every 6 (six) hours as needed. Patient not taking: Reported on 02/25/2015 12/14/14   Luretha Murphy, MD  ibuprofen (ADVIL,MOTRIN) 200 MG tablet Take 400 mg by mouth every 6 (six) hours as needed for moderate pain.    Historical Provider, MD  medroxyPROGESTERone (DEPO-PROVERA) 150 MG/ML injection Inject 150 mg into the muscle every 3 (three) months.    Historical Provider, MD  methocarbamol (ROBAXIN) 500 MG tablet Take 1 tablet (500 mg total) by mouth 2 (two) times daily. Patient not taking: Reported on 02/25/2015 10/18/14   Santiago Glad, PA-C  naproxen (NAPROSYN) 500 MG tablet Take 1 tablet (500 mg total) by mouth 2 (two) times daily. Patient not taking: Reported on 02/25/2015 10/18/14   Santiago Glad, PA-C  OXcarbazepine (TRILEPTAL) 150 MG tablet Take 150 mg by mouth 2 (two) times daily.    Historical Provider, MD  pseudoephedrine (SUDAFED) 120 MG 12 hr tablet Take 120  mg by mouth daily as needed for congestion.     Historical Provider, MD  sertraline (ZOLOFT) 100 MG tablet Take 150 mg by mouth daily.     Historical Provider, MD   BP 111/81 mmHg  Pulse 83  Temp(Src) 98.3 F (36.8 C) (Oral)  Resp 20  Ht 5\' 7"  (1.702 m)  Wt 160 lb (72.576 kg)  BMI 25.05 kg/m2  SpO2 100%  LMP  (LMP Unknown) Physical Exam  Constitutional: She appears well-developed and well-nourished. No distress.  HENT:  Head: Normocephalic and atraumatic.  Nose: No mucosal edema or rhinorrhea. No epistaxis. Right sinus exhibits maxillary sinus tenderness. Left sinus exhibits maxillary sinus tenderness.  Mouth/Throat: Uvula is midline and oropharynx is clear and moist. Mucous membranes are not dry. No uvula swelling. No oropharyngeal exudate, posterior oropharyngeal edema, posterior oropharyngeal erythema or tonsillar abscesses.  Gingiva over left upper central incisor with mild erythema and  TTP; maxillary sinus tenderness bilaterally; hole in nasal septum.  Neck: Trachea normal, normal range of motion and phonation normal. Neck supple. No tracheal tenderness present. No rigidity. No tracheal deviation, no edema, no erythema and normal range of motion present.  Cardiovascular: Normal rate, regular rhythm and normal heart sounds.  Exam reveals no gallop and no friction rub.   No murmur heard. Pulmonary/Chest: Effort normal and breath sounds normal. No stridor.  Lymphadenopathy:    She has no cervical adenopathy.  Neurological: She is alert.  Skin: She is not diaphoretic.  Nursing note and vitals reviewed.   ED Course  Procedures  DIAGNOSTIC STUDIES: Oxygen Saturation is 100% on RA, normal by my interpretation.    COORDINATION OF CARE: 9:38 AM Discussed treatment plan with pt at bedside and pt agreed to plan.  Labs Review Labs Reviewed - No data to display  Imaging Review No results found.   EKG Interpretation None      MDM   Final diagnoses:  Acute maxillary  sinusitis, recurrence not specified    Afebrile, nontoxic patient with pain over upper gingiva and nose, tenderness through maxillary sinuses.  Ongoing 2 weeks with worsening overnight.  Will treat with antibiotic.  Suspect sinus infection, less likely dental infection.   D/C home with doxycycline, pain medication, PCP follow up.  Discussed result, findings, treatment, and follow up  with patient.  Pt given return precautions.  Pt verbalizes understanding and agrees with plan.        I personally performed the services described in this documentation, which was scribed in my presence. The recorded information has been reviewed and is accurate.   Trixie Dredge, PA-C 03/09/15 1157  Richardean Canal, MD 03/10/15 [id number]

## 2015-03-09 NOTE — ED Notes (Signed)
Pt is in stable condition  Upon d/c and ambulates from ED. 

## 2015-03-09 NOTE — ED Notes (Signed)
Pt sts blew out large amount of mucous with possible scab from nose then had some bleeding from nose that is now resolved; pt sts hx of "hole in her nose"

## 2015-03-09 NOTE — Discharge Instructions (Signed)
Read the information below.  Use the prescribed medication as directed.  Please discuss all new medications with your pharmacist.  Do not take additional tylenol while taking the prescribed pain medication to avoid overdose.  You may return to the Emergency Department at any time for worsening condition or any new symptoms that concern you.    If there is any possibility that you might be pregnant or if you are nursing, please let your health care provider know and discuss this with the pharmacist to ensure medication safety.  If you develop fevers, swelling in your face, difficulty swallowing or breathing, return to the ER immediately for a recheck.    Facial Infection You have an infection of your face. This requires special attention to help prevent serious problems. Infections in facial wounds can cause poor healing and scars. They can also spread to deeper tissues, especially around the eye. Wound and dental infections can lead to sinusitis, infection of the eye socket, and even meningitis. Permanent damage to the skin, eye, and nervous system may result if facial infections are not treated properly. With severe infections, hospital care for IV antibiotic injections may be needed if they don't respond to oral antibiotics. Antibiotics must be taken for the full course to insure the infection is eliminated. If the infection came from a bad tooth, it may have to be extracted when the infection is under control. Warm compresses may be applied to reduce skin irritation and remove drainage. You might need a tetanus shot now if:  You cannot remember when your last tetanus shot was.  You have never had a tetanus shot.  The object that caused your wound was dirty. If you need a tetanus shot, and you decide not to get one, there is a rare chance of getting tetanus. Sickness from tetanus can be serious. If you got a tetanus shot, your arm may swell, get red and warm to the touch at the shot site. This is  common and not a problem. SEEK IMMEDIATE MEDICAL CARE IF:   You have increased swelling, redness, or trouble breathing.  You have a severe headache, dizziness, nausea, or vomiting.  You develop problems with your eyesight.  You have a fever. Document Released: 09/09/2004 Document Revised: 10/25/2011 Document Reviewed: 08/02/2005 [facility] Patient Information 2015 [place], [place]. This information is not intended to replace advice given to you by your health care provider. Make sure you discuss any questions you have with your health care provider.  Sinusitis Sinusitis is redness, soreness, and inflammation of the paranasal sinuses. Paranasal sinuses are air pockets within the bones of your face (beneath the eyes, the middle of the forehead, or above the eyes). In healthy paranasal sinuses, mucus is able to drain out, and air is able to circulate through them by way of your nose. However, when your paranasal sinuses are inflamed, mucus and air can become trapped. This can allow bacteria and other germs to grow and cause infection. Sinusitis can develop quickly and last only a short time (acute) or continue over a long period (chronic). Sinusitis that lasts for more than 12 weeks is considered chronic.  CAUSES  Causes of sinusitis include:  Allergies.  Structural abnormalities, such as displacement of the cartilage that separates your nostrils (deviated septum), which can decrease the air flow through your nose and sinuses and affect sinus drainage.  Functional abnormalities, such as when the small hairs (cilia) that line your sinuses and help remove mucus do not work properly or are  not present. SIGNS AND SYMPTOMS  Symptoms of acute and chronic sinusitis are the same. The primary symptoms are pain and pressure around the affected sinuses. Other symptoms include:  Upper toothache.  Earache.  Headache.  Bad breath.  Decreased sense of smell and taste.  A cough, which worsens when you  are lying flat.  Fatigue.  Fever.  Thick drainage from your nose, which often is green and may contain pus (purulent).  Swelling and warmth over the affected sinuses. DIAGNOSIS  Your health care provider will perform a physical exam. During the exam, your health care provider may:  Look in your nose for signs of abnormal growths in your nostrils (nasal polyps).  Tap over the affected sinus to check for signs of infection.  View the inside of your sinuses (endoscopy) using an imaging device that has a light attached (endoscope). If your health care provider suspects that you have chronic sinusitis, one or more of the following tests may be recommended:  Allergy tests.  Nasal culture. A sample of mucus is taken from your nose, sent to a lab, and screened for bacteria.  Nasal cytology. A sample of mucus is taken from your nose and examined by your health care provider to determine if your sinusitis is related to an allergy. TREATMENT  Most cases of acute sinusitis are related to a viral infection and will resolve on their own within 10 days. Sometimes medicines are prescribed to help relieve symptoms (pain medicine, decongestants, nasal steroid sprays, or saline sprays).  However, for sinusitis related to a bacterial infection, your health care provider will prescribe antibiotic medicines. These are medicines that will help kill the bacteria causing the infection.  Rarely, sinusitis is caused by a fungal infection. In theses cases, your health care provider will prescribe antifungal medicine. For some cases of chronic sinusitis, surgery is needed. Generally, these are cases in which sinusitis recurs more than 3 times per year, despite other treatments. HOME CARE INSTRUCTIONS   Drink plenty of water. Water helps thin the mucus so your sinuses can drain more easily.  Use a humidifier.  Inhale steam 3 to 4 times a day (for example, sit in the bathroom with the shower running).  Apply a  warm, moist washcloth to your face 3 to 4 times a day, or as directed by your health care provider.  Use saline nasal sprays to help moisten and clean your sinuses.  Take medicines only as directed by your health care provider.  If you were prescribed either an antibiotic or antifungal medicine, finish it all even if you start to feel better. SEEK IMMEDIATE MEDICAL CARE IF:  You have increasing pain or severe headaches.  You have nausea, vomiting, or drowsiness.  You have swelling around your face.  You have vision problems.  You have a stiff neck.  You have difficulty breathing. MAKE SURE YOU:   Understand these instructions.  Will watch your condition.  Will get help right away if you are not doing well or get worse. Document Released: 08/02/2005 Document Revised: 12/17/2013 Document Reviewed: 08/17/2011 [facility] Patient Information 2015 [place], [place]. This information is not intended to replace advice given to you by your health care provider. Make sure you discuss any questions you have with your health care provider.

## 2015-03-13 NOTE — H&P (Signed)
[name] is an 29 y.o. female. She desires permanent sterility and is admitted for laparoscopic tubal.  I was going to schedule this at the same time she had her cholecystectomy, but she ended up needing that surgery emergently.  She has been on DMPA for contraception in the meantime without problems.    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    Past Medical History  Diagnosis Date  . Asthma   . Headache(784.0)     migraines  . Depression   . PIH (pregnancy induced hypertension)   . Late prenatal care     22wks  . Anemia 2007  . HPV (human papilloma virus) anogenital infection   . Recurrent UTI   . Abnormal Pap smear   . Chlamydia infection     has no taken treatment as of 01/03/2013  . SVD (spontaneous vaginal delivery) 01/08/2013  . Vaginal Pap smear, abnormal   . Twin pregnancy in third trimester 08/08/2014  . Pancreatitis, acute 08/08/2014  . Pregnancy induced hypertension   . Acute gallstone pancreatitis 08/30/2014  . Anxiety     Past Surgical History  Procedure Laterality Date  . Vaginal delivery N/A 09/18/2014    Procedure: VAGINAL DELIVERY;  Surgeon: Lavina Hamman, MD;  Location: [facility];  Service: Obstetrics;  Laterality: N/A;  . Cholecystectomy  12/13/2014  . Cholecystectomy N/A 12/13/2014    Procedure: LAPAROSCOPIC CHOLECYSTECTOMY WITH INTRAOPERATIVE CHOLANGIOGRAM;  Surgeon: Manus Rudd, MD;  Location: [facility] OR;  Service: General;  Laterality: N/A;    Family History  Problem Relation Age of Onset  . Anesthesia problems Neg Hx   . Hypotension Neg Hx   . Malignant hyperthermia Neg Hx   . Pseudochol deficiency Neg Hx   . Heart disease Maternal Grandmother     clots in heart  . Stroke Maternal Grandmother   . Diabetes Mother   . Heart disease Maternal Grandfather     pacemaker  . Miscarriages / Stillbirths Paternal Aunt   . Stroke  Paternal Grandmother     Social History:  reports that she has been smoking Cigarettes.  She has a 1.25 pack-year smoking history. She has never used smokeless tobacco. She reports that she does not drink alcohol or use illicit drugs.  Allergies:  Allergies  Allergen Reactions  . Corn Syrup Anaphylaxis and Hives  . Tramadol Diarrhea and Itching    No prescriptions prior to admission    Review of Systems  Respiratory: Negative.   Cardiovascular: Negative.     unknown if currently breastfeeding. Physical Exam  Constitutional: She appears well-developed and well-nourished.  Cardiovascular: Normal rate, regular rhythm and normal heart sounds.   No murmur heard. Respiratory: Effort normal and breath sounds normal. No respiratory distress. She has no wheezes.  GI: Soft. She exhibits no distension and no mass. There is no tenderness.  Genitourinary: Vagina normal and uterus normal.  No adnexal mass    No results found for this or any previous visit (from the past 24 hour(s)).  No results found.  Assessment/Plan: Desires permanent surgical sterility.  All options for contraception have been discussed.  Surgical procedure, risks, chances of failure have all been discussed.  Will admit for laparoscopic bilateral tubal fulguration.  Asiah Befort,Cauble D 03/13/2015, 9:08 PM

## 2015-03-14 ENCOUNTER — Ambulatory Visit (HOSPITAL_COMMUNITY)
Admission: RE | Admit: 2015-03-14 | Discharge: 2015-03-14 | Disposition: A | Discharge status: Home / Self Care | Payer: Medicaid Other | Source: Ambulatory Visit | Attending: Obstetrics and Gynecology | Admitting: Obstetrics and Gynecology

## 2015-03-14 ENCOUNTER — Ambulatory Visit (HOSPITAL_COMMUNITY): Payer: Medicaid Other | Admitting: Anesthesiology

## 2015-03-14 ENCOUNTER — Encounter (HOSPITAL_COMMUNITY)
Admission: RE | Disposition: A | Discharge status: Home / Self Care | Payer: Self-pay | Source: Ambulatory Visit | Attending: Obstetrics and Gynecology

## 2015-03-14 DIAGNOSIS — F329 Major depressive disorder, single episode, unspecified: Secondary | ICD-10-CM | POA: Insufficient documentation

## 2015-03-14 DIAGNOSIS — Z886 Allergy status to analgesic agent status: Secondary | ICD-10-CM | POA: Diagnosis not present

## 2015-03-14 DIAGNOSIS — D649 Anemia, unspecified: Secondary | ICD-10-CM | POA: Insufficient documentation

## 2015-03-14 DIAGNOSIS — F17210 Nicotine dependence, cigarettes, uncomplicated: Secondary | ICD-10-CM | POA: Insufficient documentation

## 2015-03-14 DIAGNOSIS — J45909 Unspecified asthma, uncomplicated: Secondary | ICD-10-CM | POA: Diagnosis not present

## 2015-03-14 DIAGNOSIS — G43909 Migraine, unspecified, not intractable, without status migrainosus: Secondary | ICD-10-CM | POA: Diagnosis not present

## 2015-03-14 DIAGNOSIS — Z302 Encounter for sterilization: Secondary | ICD-10-CM | POA: Insufficient documentation

## 2015-03-14 HISTORY — PX: LAPAROSCOPIC TUBAL LIGATION: SHX1937

## 2015-03-14 LAB — PREGNANCY, URINE: Preg Test, Ur: NEGATIVE

## 2015-03-14 SURGERY — LIGATION, FALLOPIAN TUBE, LAPAROSCOPIC
Anesthesia: General | Site: Abdomen | Laterality: Bilateral

## 2015-03-14 MED ORDER — ONDANSETRON HCL 4 MG/2ML IJ SOLN
INTRAMUSCULAR | Status: DC | PRN
  Administered 2015-03-14: 4 mg via INTRAVENOUS

## 2015-03-14 MED ORDER — ACETAMINOPHEN 325 MG PO TABS: 650.0000 mg | ORAL_TABLET | Freq: Once | ORAL | Status: DC

## 2015-03-14 MED ORDER — DEXAMETHASONE SODIUM PHOSPHATE 10 MG/ML IJ SOLN
INTRAMUSCULAR | Status: DC | PRN
  Administered 2015-03-14: 4 mg via INTRAVENOUS

## 2015-03-14 MED ORDER — LIDOCAINE HCL (CARDIAC) 20 MG/ML IV SOLN
INTRAVENOUS | Status: AC
  Filled 2015-03-14: qty 5

## 2015-03-14 MED ORDER — FENTANYL CITRATE (PF) 100 MCG/2ML IJ SOLN
INTRAMUSCULAR | Status: AC
  Filled 2015-03-14: qty 2

## 2015-03-14 MED ORDER — SUCCINYLCHOLINE CHLORIDE 20 MG/ML IJ SOLN
INTRAMUSCULAR | Status: DC | PRN
  Administered 2015-03-14: 100 mg via INTRAVENOUS

## 2015-03-14 MED ORDER — MEPERIDINE HCL 25 MG/ML IJ SOLN
6.2500 mg | INTRAMUSCULAR | Status: DC | PRN
  Administered 2015-03-14: 6.25 mg via INTRAVENOUS

## 2015-03-14 MED ORDER — ROCURONIUM BROMIDE 100 MG/10ML IV SOLN
INTRAVENOUS | Status: AC
Start: 2015-03-14 — End: 2015-03-14
  Filled 2015-03-14: qty 1

## 2015-03-14 MED ORDER — MEPERIDINE HCL 25 MG/ML IJ SOLN
INTRAMUSCULAR | Status: AC
  Filled 2015-03-14: qty 1

## 2015-03-14 MED ORDER — HYDROCODONE-ACETAMINOPHEN 5-325 MG PO TABS: 1.0000 | ORAL_TABLET | Freq: Four times a day (QID) | ORAL | Status: DC | PRN

## 2015-03-14 MED ORDER — MIDAZOLAM HCL 2 MG/2ML IJ SOLN
INTRAMUSCULAR | Status: AC
  Filled 2015-03-14: qty 2

## 2015-03-14 MED ORDER — ONDANSETRON HCL 4 MG/2ML IJ SOLN: 4.0000 mg | Freq: Once | INTRAMUSCULAR | Status: DC | PRN

## 2015-03-14 MED ORDER — KETOROLAC TROMETHAMINE 30 MG/ML IJ SOLN: 30.0000 mg | Freq: Once | INTRAMUSCULAR | Status: DC | PRN

## 2015-03-14 MED ORDER — FENTANYL CITRATE (PF) 250 MCG/5ML IJ SOLN
INTRAMUSCULAR | Status: AC
  Filled 2015-03-14: qty 5

## 2015-03-14 MED ORDER — SUCCINYLCHOLINE CHLORIDE 20 MG/ML IJ SOLN
INTRAMUSCULAR | Status: AC
  Filled 2015-03-14: qty 1

## 2015-03-14 MED ORDER — ACETAMINOPHEN 325 MG PO TABS
ORAL_TABLET | ORAL | Status: AC
  Filled 2015-03-14: qty 2

## 2015-03-14 MED ORDER — PROPOFOL 10 MG/ML IV BOLUS
INTRAVENOUS | Status: AC
Start: 2015-03-14 — End: 2015-03-14
  Filled 2015-03-14: qty 20

## 2015-03-14 MED ORDER — MIDAZOLAM HCL 2 MG/2ML IJ SOLN
INTRAMUSCULAR | Status: DC | PRN
  Administered 2015-03-14: 2 mg via INTRAVENOUS

## 2015-03-14 MED ORDER — PROPOFOL 10 MG/ML IV BOLUS
INTRAVENOUS | Status: DC | PRN
  Administered 2015-03-14: 180 mg via INTRAVENOUS

## 2015-03-14 MED ORDER — ONDANSETRON HCL 4 MG/2ML IJ SOLN
INTRAMUSCULAR | Status: AC
  Filled 2015-03-14: qty 2

## 2015-03-14 MED ORDER — LIDOCAINE HCL (CARDIAC) 20 MG/ML IV SOLN
INTRAVENOUS | Status: DC | PRN
  Administered 2015-03-14: 50 mg via INTRAVENOUS

## 2015-03-14 MED ORDER — SCOPOLAMINE 1 MG/3DAYS TD PT72
1.0000 | MEDICATED_PATCH | Freq: Once | TRANSDERMAL | Status: DC
  Administered 2015-03-14: 1.5 mg via TRANSDERMAL

## 2015-03-14 MED ORDER — BUPIVACAINE HCL (PF) 0.25 % IJ SOLN
INTRAMUSCULAR | Status: AC
  Filled 2015-03-14: qty 30

## 2015-03-14 MED ORDER — SCOPOLAMINE 1 MG/3DAYS TD PT72
MEDICATED_PATCH | TRANSDERMAL | Status: AC
  Filled 2015-03-14: qty 1

## 2015-03-14 MED ORDER — LACTATED RINGERS IV SOLN
INTRAVENOUS | Status: DC
Start: 2015-03-14 — End: 2015-03-14
  Administered 2015-03-14: 07:00:00 via INTRAVENOUS

## 2015-03-14 MED ORDER — BUPIVACAINE HCL (PF) 0.25 % IJ SOLN
INTRAMUSCULAR | Status: DC | PRN
  Administered 2015-03-14: 5 mL

## 2015-03-14 MED ORDER — OXYCODONE-ACETAMINOPHEN 5-325 MG PO TABS
ORAL_TABLET | ORAL | Status: AC
  Administered 2015-03-14: 1 via ORAL
  Filled 2015-03-14: qty 1

## 2015-03-14 MED ORDER — FENTANYL CITRATE (PF) 100 MCG/2ML IJ SOLN
INTRAMUSCULAR | Status: DC | PRN
  Administered 2015-03-14: 100 ug via INTRAVENOUS
  Administered 2015-03-14: 50 ug via INTRAVENOUS
  Administered 2015-03-14: 100 ug via INTRAVENOUS

## 2015-03-14 MED ORDER — ROCURONIUM BROMIDE 100 MG/10ML IV SOLN
INTRAVENOUS | Status: DC | PRN
  Administered 2015-03-14: 5 mg via INTRAVENOUS

## 2015-03-14 MED ORDER — OXYCODONE-ACETAMINOPHEN 5-325 MG PO TABS
1.0000 | ORAL_TABLET | ORAL | Status: DC | PRN
  Administered 2015-03-14: 1 via ORAL

## 2015-03-14 MED ORDER — FENTANYL CITRATE (PF) 100 MCG/2ML IJ SOLN
25.0000 ug | INTRAMUSCULAR | Status: DC | PRN
  Administered 2015-03-14: 50 ug via INTRAVENOUS
  Administered 2015-03-14 (×2): 25 ug via INTRAVENOUS

## 2015-03-14 MED ORDER — DEXAMETHASONE SODIUM PHOSPHATE 4 MG/ML IJ SOLN
INTRAMUSCULAR | Status: AC
  Filled 2015-03-14: qty 1

## 2015-03-14 SURGICAL SUPPLY — 21 items
CATH ROBINSON RED A/P 16FR (CATHETERS) ×3 IMPLANT
CHLORAPREP W/TINT 26ML (MISCELLANEOUS) ×3 IMPLANT
CLOTH BEACON ORANGE TIMEOUT ST (SAFETY) ×3 IMPLANT
DRSG COVADERM PLUS 2X2 (GAUZE/BANDAGES/DRESSINGS) ×6 IMPLANT
DRSG OPSITE POSTOP 3X4 (GAUZE/BANDAGES/DRESSINGS) ×2 IMPLANT
GLOVE BIO SURGEON STRL SZ8 (GLOVE) ×3 IMPLANT
GLOVE ORTHO TXT STRL SZ7.5 (GLOVE) ×3 IMPLANT
GOWN STRL REUS W/TWL LRG LVL3 (GOWN DISPOSABLE) ×6 IMPLANT
LIQUID BAND (GAUZE/BANDAGES/DRESSINGS) ×3 IMPLANT
NEEDLE INSUFFLATION 120MM (ENDOMECHANICALS) ×3 IMPLANT
PACK LAPAROSCOPY BASIN (CUSTOM PROCEDURE TRAY) ×3 IMPLANT
PAD POSITIONER PINK NONSTERILE (MISCELLANEOUS) ×3 IMPLANT
SUT VIC AB 3-0 CTX 36 (SUTURE) IMPLANT
SUT VIC AB 3-0 PS2 18 (SUTURE) ×3
SUT VIC AB 3-0 PS2 18XBRD (SUTURE) ×1 IMPLANT
SUT VICRYL 0 UR6 27IN ABS (SUTURE) ×2 IMPLANT
TOWEL OR 17X24 6PK STRL BLUE (TOWEL DISPOSABLE) ×6 IMPLANT
TROCAR XCEL NON-BLD 11X100MML (ENDOMECHANICALS) ×3 IMPLANT
TROCAR XCEL NON-BLD 5MMX100MML (ENDOMECHANICALS) ×3 IMPLANT
WARMER LAPAROSCOPE (MISCELLANEOUS) ×3 IMPLANT
WATER STERILE IRR 1000ML POUR (IV SOLUTION) ×3 IMPLANT

## 2015-03-14 NOTE — Transfer of Care (Signed)
Immediate Anesthesia Transfer of Care Note  Patient: [name]  Procedure(s) Performed: Procedure(s): LAPAROSCOPIC TUBAL LIGATION (Bilateral)  Patient Location: PACU  Anesthesia Type:General  Level of Consciousness: awake, alert  and oriented  Airway & Oxygen Therapy: Patient Spontanous Breathing and Patient connected to nasal cannula oxygen  Post-op Assessment: Report given to RN and Post -op Vital signs reviewed and stable  Post vital signs: Reviewed and stable  Last Vitals:  Filed Vitals:   03/14/15 0606  BP: 113/67  Pulse: 70  Temp: 36.7 C  Resp: 18    Complications: No apparent anesthesia complications

## 2015-03-14 NOTE — Interval H&P Note (Signed)
History and Physical Interval Note:  03/14/2015 7:06 AM  [name]  has presented today for surgery, with the diagnosis of Sterilization  The various methods of treatment have been discussed with the patient and family. After consideration of risks, benefits and other options for treatment, the patient has consented to  Procedure(s): LAPAROSCOPIC TUBAL LIGATION (Bilateral) as a surgical intervention .  The patient's history has been reviewed, patient examined, no change in status, stable for surgery.  I have reviewed the patient's chart and labs.  Questions were answered to the patient's satisfaction.     Jemarcus Dougal,Sean D

## 2015-03-14 NOTE — Discharge Instructions (Signed)
Routine instructions for laparoscopy ° ° °Post Anesthesia Home Care Instructions ° °Activity: °Get plenty of rest for the remainder of the day. A responsible adult should stay with you for 24 hours following the procedure.  °For the next 24 hours, DO NOT: °-Drive a car °-Operate machinery °-Drink alcoholic beverages °-Take any medication unless instructed by your physician °-Make any legal decisions or sign important papers. ° °Meals: °Start with liquid foods such as gelatin or soup. Progress to regular foods as tolerated. Avoid greasy, spicy, heavy foods. If nausea and/or vomiting occur, drink only clear liquids until the nausea and/or vomiting subsides. Call your physician if vomiting continues. ° °Special Instructions/Symptoms: °Your throat may feel dry or sore from the anesthesia or the breathing tube placed in your throat during surgery. If this causes discomfort, gargle with warm salt water. The discomfort should disappear within 24 hours. ° °If you had a scopolamine patch placed behind your ear for the management of post- operative nausea and/or vomiting: ° °1. The medication in the patch is effective for 72 hours, after which it should be removed.  Wrap patch in a tissue and discard in the trash. Wash hands thoroughly with soap and water. °2. You may remove the patch earlier than 72 hours if you experience unpleasant side effects which may include dry mouth, dizziness or visual disturbances. °3. Avoid touching the patch. Wash your hands with soap and water after contact with the patch. °  ° °

## 2015-03-14 NOTE — Anesthesia Preprocedure Evaluation (Addendum)
Anesthesia Evaluation  Patient identified by MRN, date of birth, ID band Patient awake    Reviewed: Allergy & Precautions, Patient's Chart, lab work & pertinent test results  Airway Mallampati: I       Dental  (+) Missing,    Pulmonary asthma , Current Smoker,    Pulmonary exam normal       Cardiovascular hypertension, Normal cardiovascular exam    Neuro/Psych  Headaches, PSYCHIATRIC DISORDERS Anxiety Depression    GI/Hepatic negative GI ROS, Neg liver ROS,   Endo/Other  negative endocrine ROS  Renal/GU negative Renal ROS  negative genitourinary   Musculoskeletal negative musculoskeletal ROS (+)   Abdominal Normal abdominal exam  (+)   Peds  Hematology   Anesthesia Other Findings   Reproductive/Obstetrics Desires Sterilization                            Anesthesia Physical Anesthesia Plan  ASA: II  Anesthesia Plan: General   Post-op Pain Management:    Induction: Intravenous  Airway Management Planned: Oral ETT  Additional Equipment:   Intra-op Plan:   Post-operative Plan: Extubation in OR  Informed Consent: I have reviewed the patients History and Physical, chart, labs and discussed the procedure including the risks, benefits and alternatives for the proposed anesthesia with the patient or authorized representative who has indicated his/her understanding and acceptance.   Dental advisory given  Plan Discussed with: Surgeon  Anesthesia Plan Comments:        Anesthesia Quick Evaluation

## 2015-03-14 NOTE — Anesthesia Postprocedure Evaluation (Signed)
Anesthesia Post Note  Patient: [name]  Procedure(s) Performed: Procedure(s) (LRB): LAPAROSCOPIC TUBAL LIGATION (Bilateral)  Anesthesia type: General  Patient location: PACU  Post pain: Pain level controlled  Post assessment: Post-op Vital signs reviewed  Last Vitals:  Filed Vitals:   03/14/15 0810  BP: 119/69  Pulse:   Temp: 37.1 C  Resp: 12    Post vital signs: Reviewed  Level of consciousness: sedated  Complications: No apparent anesthesia complications

## 2015-03-14 NOTE — Anesthesia Procedure Notes (Signed)
Procedure Name: Intubation Date/Time: 03/14/2015 7:27 AM Performed by: Shanon Payor Pre-anesthesia Checklist: Patient identified, Emergency Drugs available, Suction available, Patient being monitored and Timeout performed Patient Re-evaluated:Patient Re-evaluated prior to inductionOxygen Delivery Method: Circle system utilized Preoxygenation: Pre-oxygenation with 100% oxygen Intubation Type: IV induction Ventilation: Mask ventilation without difficulty Grade View: Grade I Tube type: Oral Number of attempts: 1 Airway Equipment and Method: Stylet Placement Confirmation: ETT inserted through vocal cords under direct vision,  positive ETCO2 and breath sounds checked- equal and bilateral Secured at: 21 cm Tube secured with: Tape Dental Injury: Teeth and Oropharynx as per pre-operative assessment

## 2015-03-14 NOTE — Op Note (Signed)
Preoperative diagnosis: Desires surgical sterility Postoperative diagnosis: Same Procedure: Laparoscopic bilateral tubal fulguration Surgeon: Lavina Hamman M.D. Anesthesia: Gen. Endotracheal tube Findings: She had a normal abdomen and pelvis with normal uterus tubes and ovaries Specimens: Distal end of each fallopian tube Estimated blood loss: Minimal Complications: None  Procedure in detail  The patient was taken to the operating room and placed in the dorsosupine position. General anesthesia was induced. Her legs were placed in mobile stirrups and her left arm was tucked to her side. Abdomen perineum and vagina were then prepped and draped in the usual sterile fashion, bladder drained with a red robinson catheter, a Hulka tenaculum was applied to the cervix for uterine manipulation. Infraumbilical skin was then infiltrated with quarter percent Marcaine and a 1 cm vertical incision was made. The veress needle was inserted into the peritoneal cavity and placement confirmed by the water drop test and an opening pressure of 5 mm of mercury. CO2 was insufflated to a pressure of 14 mm of mercury and the veress needle was removed. A 10/11 disposable trocar was then introduced with direct visualization with the laparoscope. Pelvis was inspected and found to be normal.  Both fallopian tubes were identified and traced to their fimbriated ends.  3 segments of the middle portion of each tube were fulgurated until the Amp meter read 0.  All gas was allowed to deflate from the abdomen and the umbilical trocar was removed. One figure-of-eight suture of 0 Vicryl was placed in the umbilical incision. Skin incision was then closed with interrupted subcuticular suture of 4-0 Vicryl followed by Dermabond. The Hulka tenaculum was removed. The patient was taken down from stirrups. She was awakened in the operating room and taken to the recovery room in stable condition after tolerating the procedure well. Counts were  correct and she had PAS hose on throughout the procedure.

## 2015-03-17 ENCOUNTER — Other Ambulatory Visit (HOSPITAL_COMMUNITY): Payer: Self-pay | Admitting: Obstetrics and Gynecology

## 2015-03-17 ENCOUNTER — Inpatient Hospital Stay (HOSPITAL_COMMUNITY)
Admission: AD | Admit: 2015-03-17 | Discharge: 2015-03-17 | Disposition: A | Discharge status: Home / Self Care | Payer: Medicaid Other | Source: Ambulatory Visit | Attending: Obstetrics and Gynecology | Admitting: Obstetrics and Gynecology

## 2015-03-17 ENCOUNTER — Inpatient Hospital Stay (HOSPITAL_COMMUNITY): Payer: Medicaid Other

## 2015-03-17 ENCOUNTER — Encounter (HOSPITAL_COMMUNITY): Payer: Self-pay | Admitting: *Deleted

## 2015-03-17 DIAGNOSIS — G8918 Other acute postprocedural pain: Secondary | ICD-10-CM | POA: Diagnosis not present

## 2015-03-17 DIAGNOSIS — F17210 Nicotine dependence, cigarettes, uncomplicated: Secondary | ICD-10-CM | POA: Diagnosis not present

## 2015-03-17 DIAGNOSIS — Z4889 Encounter for other specified surgical aftercare: Secondary | ICD-10-CM

## 2015-03-17 DIAGNOSIS — R109 Unspecified abdominal pain: Secondary | ICD-10-CM | POA: Insufficient documentation

## 2015-03-17 LAB — CBC WITH DIFFERENTIAL/PLATELET
Basophils Absolute: 0.1 10*3/uL (ref 0.0–0.1)
Basophils Relative: 1 % (ref 0–1)
EOS ABS: 0.2 10*3/uL (ref 0.0–0.7)
Eosinophils Relative: 2 % (ref 0–5)
HEMATOCRIT: 36.8 % (ref 36.0–46.0)
Hemoglobin: 12.6 g/dL (ref 12.0–15.0)
Lymphocytes Relative: 37 % (ref 12–46)
Lymphs Abs: 3.5 10*3/uL (ref 0.7–4.0)
MCH: 30.1 pg (ref 26.0–34.0)
MCHC: 34.2 g/dL (ref 30.0–36.0)
MCV: 87.8 fL (ref 78.0–100.0)
MONOS PCT: 4 % (ref 3–12)
Monocytes Absolute: 0.3 10*3/uL (ref 0.1–1.0)
Neutro Abs: 5.4 10*3/uL (ref 1.7–7.7)
Neutrophils Relative %: 56 % (ref 43–77)
PLATELETS: 307 10*3/uL (ref 150–400)
RBC: 4.19 MIL/uL (ref 3.87–5.11)
RDW: 12.9 % (ref 11.5–15.5)
WBC: 9.4 10*3/uL (ref 4.0–10.5)

## 2015-03-17 LAB — COMPREHENSIVE METABOLIC PANEL
ALBUMIN: 4.3 g/dL (ref 3.5–5.0)
ALK PHOS: 66 U/L (ref 38–126)
ALT: 15 U/L (ref 14–54)
AST: 15 U/L (ref 15–41)
Anion gap: 3 — ABNORMAL LOW (ref 5–15)
BILIRUBIN TOTAL: 0.3 mg/dL (ref 0.3–1.2)
BUN: 15 mg/dL (ref 6–20)
CHLORIDE: 109 mmol/L (ref 101–111)
CO2: 25 mmol/L (ref 22–32)
Calcium: 9 mg/dL (ref 8.9–10.3)
Creatinine, Ser: 0.69 mg/dL (ref 0.44–1.00)
GFR calc Af Amer: >60 mL/min (ref >60)
GFR calc non Af Amer: >60 mL/min (ref >60)
Glucose, Bld: 89 mg/dL (ref 65–99)
POTASSIUM: 4.1 mmol/L (ref 3.5–5.1)
SODIUM: 137 mmol/L (ref 135–145)
Total Protein: 7 g/dL (ref 6.5–8.1)

## 2015-03-17 MED ORDER — IOHEXOL 300 MG/ML  SOLN
50.0000 mL | Freq: Once | INTRAMUSCULAR | Status: AC | PRN
  Administered 2015-03-17: 50 mL via ORAL

## 2015-03-17 MED ORDER — IOHEXOL 300 MG/ML  SOLN
100.0000 mL | Freq: Once | INTRAMUSCULAR | Status: AC | PRN
  Administered 2015-03-17: 100 mL via INTRAVENOUS

## 2015-03-17 MED ORDER — OXYCODONE-ACETAMINOPHEN 5-325 MG PO TABS: 1.0000 | ORAL_TABLET | Freq: Four times a day (QID) | ORAL | Status: DC | PRN

## 2015-03-17 MED ORDER — SODIUM CHLORIDE 0.9 % IV SOLN
INTRAVENOUS | Status: DC
  Administered 2015-03-17: 16:00:00 via INTRAVENOUS

## 2015-03-17 MED ORDER — KETOROLAC TROMETHAMINE 60 MG/2ML IM SOLN
60.0000 mg | Freq: Once | INTRAMUSCULAR | Status: AC
  Administered 2015-03-17: 60 mg via INTRAMUSCULAR
  Filled 2015-03-17: qty 2

## 2015-03-17 NOTE — MAU Provider Note (Signed)
History     CSN: [id number]  Arrival date and time: 03/17/15 1537   First Provider Initiated Contact with Patient 03/17/15 1609      Chief Complaint  Patient presents with  . Abdominal Pain   HPI [name] is a 29 y.o. [phone] who presents to MAU today from the office for further evaluation of abdominal pain and distention after surgery. The patient had a laparoscopic BTL on 03/14/15. She states temperature of 100.0 F today at home. She denies N/V today. She states that she was constipation post operatively, but had a normal BM yesterday and a loose stool today. She does also endorse pain with urination "all over."    OB History    Gravida Para Term Preterm AB TAB SAB Ectopic Multiple Living   6 4 3 1 2  0 2 0 1 5      Past Medical History  Diagnosis Date  . Asthma   . Headache(784.0)     migraines  . Depression   . PIH (pregnancy induced hypertension)   . Late prenatal care     22wks  . Anemia 2007  . HPV (human papilloma virus) anogenital infection   . Recurrent UTI   . Abnormal Pap smear   . Chlamydia infection     has no taken treatment as of 01/03/2013  . SVD (spontaneous vaginal delivery) 01/08/2013  . Vaginal Pap smear, abnormal   . Twin pregnancy in third trimester 08/08/2014  . Pancreatitis, acute 08/08/2014  . Pregnancy induced hypertension   . Acute gallstone pancreatitis 08/30/2014  . Anxiety     Past Surgical History  Procedure Laterality Date  . Vaginal delivery N/A 09/18/2014    Procedure: VAGINAL DELIVERY;  Surgeon: Lavina Hamman, MD;  Location: [facility];  Service: Obstetrics;  Laterality: N/A;  . Cholecystectomy  12/13/2014  . Cholecystectomy N/A 12/13/2014    Procedure: LAPAROSCOPIC CHOLECYSTECTOMY WITH INTRAOPERATIVE CHOLANGIOGRAM;  Surgeon: Manus Rudd, MD;  Location: [facility];  Service: General;  Laterality: N/A;  . Laparoscopic tubal ligation Bilateral 03/14/2015    Procedure: LAPAROSCOPIC TUBAL LIGATION;  Surgeon: Lavina Hamman, MD;   Location: [facility];  Service: Gynecology;  Laterality: Bilateral;    Family History  Problem Relation Age of Onset  . Anesthesia problems Neg Hx   . Hypotension Neg Hx   . Malignant hyperthermia Neg Hx   . Pseudochol deficiency Neg Hx   . Heart disease Maternal Grandmother     clots in heart  . Stroke Maternal Grandmother   . Diabetes Mother   . Heart disease Maternal Grandfather     pacemaker  . Miscarriages / Stillbirths Paternal Aunt   . Stroke Paternal Grandmother     History  Substance Use Topics  . Smoking status: Current Every Day Smoker -- 0.25 packs/day for 5 years    Types: Cigarettes  . Smokeless tobacco: Never Used  . Alcohol Use: No    Allergies:  Allergies  Allergen Reactions  . Corn Syrup Anaphylaxis and Hives  . Tramadol Diarrhea and Itching    Prescriptions prior to admission  Medication Sig Dispense Refill Last Dose  . acetaminophen (TYLENOL) 500 MG tablet Take 1,000 mg by mouth every 6 (six) hours as needed for mild pain or headache.   Past Week at Unknown time  . aspirin-acetaminophen-caffeine (EXCEDRIN MIGRAINE) 250-250-65 MG per tablet Take 2 tablets by mouth every 6 (six) hours as needed for headache.   Past Week at Unknown time  . busPIRone (BUSPAR) 15  MG tablet Take 15 mg by mouth 3 (three) times daily.   03/16/2015 at Unknown time  . diphenhydrAMINE (BENADRYL) 25 MG tablet Take 1 tablet by mouth daily as needed.   03/16/2015 at Unknown time  . ferrous sulfate 325 (65 FE) MG tablet Take 1 tablet (325 mg total) by mouth daily with breakfast. (Patient taking differently: Take 325 mg by mouth 3 (three) times daily with meals. ) 30 tablet 3 03/16/2015 at Unknown time  . HYDROcodone-acetaminophen (NORCO) 5-325 MG per tablet Take 1-2 tablets by mouth every 6 (six) hours as needed for moderate pain. 20 tablet 0 Past Week at Unknown time  . ibuprofen (ADVIL,MOTRIN) 800 MG tablet Take 1 tablet (800 mg total) by mouth every 8 (eight) hours as needed for mild pain  or moderate pain. 15 tablet 0 Past Week at Unknown time  . medroxyPROGESTERone (DEPO-PROVERA) 150 MG/ML injection Inject 150 mg into the muscle every 3 (three) months.   Past Week at Unknown time  . OXcarbazepine (TRILEPTAL) 150 MG tablet Take 150 mg by mouth 2 (two) times daily.   03/16/2015 at Unknown time  . oxyCODONE-acetaminophen (PERCOCET/ROXICET) 5-325 MG per tablet Take 1 tablet by mouth every 4 (four) hours as needed for severe pain.   03/17/2015 at Unknown time  . pseudoephedrine (SUDAFED) 120 MG 12 hr tablet Take 120 mg by mouth daily as needed for congestion.    Past Week at Unknown time  . sertraline (ZOLOFT) 100 MG tablet Take 150 mg by mouth daily.    03/16/2015 at Unknown time  . albuterol (PROVENTIL HFA;VENTOLIN HFA) 108 (90 BASE) MCG/ACT inhaler Inhale 2 puffs into the lungs every 6 (six) hours as needed for wheezing or shortness of breath. Patient stated needs  new prescription   [facility]  . doxycycline (VIBRAMYCIN) 100 MG capsule Take 1 capsule (100 mg total) by mouth 2 (two) times daily. One po bid x 7 days (Patient not taking: Reported on 03/17/2015) 14 capsule 0 Completed Course at Unknown time  . HYDROcodone-acetaminophen (NORCO/VICODIN) 5-325 MG per tablet Take 1 tablet by mouth every 4 (four) hours as needed for moderate pain or severe pain. (Patient not taking: Reported on 03/17/2015) 10 tablet 0 Not Taking at Unknown time  . methocarbamol (ROBAXIN) 500 MG tablet Take 1 tablet (500 mg total) by mouth 2 (two) times daily. (Patient not taking: Reported on 02/25/2015) 20 tablet 0 Not Taking at Unknown time    Review of Systems  Constitutional: Negative for fever and malaise/fatigue.  Gastrointestinal: Positive for abdominal pain. Negative for nausea, vomiting, diarrhea and constipation.  Genitourinary: Positive for dysuria. Negative for urgency and frequency.   Physical Exam   Blood pressure 122/69, pulse 89, temperature 98.6 F (37 C), temperature source Oral, resp. rate 18, height  5\' 7"  (1.702 m), weight 160 lb (72.576 kg), unknown if currently breastfeeding.  Physical Exam  Nursing note and vitals reviewed. Constitutional: She is oriented to person, place, and time. She appears well-developed and well-nourished. No distress.  HENT:  Head: Normocephalic and atraumatic.  Cardiovascular: Normal rate.   Respiratory: Effort normal.  GI: Soft. She exhibits distension. She exhibits no mass. There is tenderness. There is no rebound and no guarding.  Neurological: She is alert and oriented to person, place, and time.  Skin: Skin is warm and dry. No erythema.  Psychiatric: She has a normal mood and affect.   Results for orders placed or performed during the hospital encounter of 03/17/15 (from the past 24 hour(s))  CBC with Differential/Platelet     Status: None   Collection Time: 03/17/15  4:05 PM  Result Value Ref Range   WBC 9.4 4.0 - 10.5 K/uL   RBC 4.19 3.87 - 5.11 MIL/uL   Hemoglobin 12.6 12.0 - 15.0 g/dL   HCT 16.1 09.6 - 04.5 %   MCV 87.8 78.0 - 100.0 fL   MCH 30.1 26.0 - 34.0 pg   MCHC 34.2 30.0 - 36.0 g/dL   RDW 40.9 81.1 - 91.4 %   Platelets 307 150 - 400 K/uL   Neutrophils Relative % 56 43 - 77 %   Neutro Abs 5.4 1.7 - 7.7 K/uL   Lymphocytes Relative 37 12 - 46 %   Lymphs Abs 3.5 0.7 - 4.0 K/uL   Monocytes Relative 4 3 - 12 %   Monocytes Absolute 0.3 0.1 - 1.0 K/uL   Eosinophils Relative 2 0 - 5 %   Eosinophils Absolute 0.2 0.0 - 0.7 K/uL   Basophils Relative 1 0 - 1 %   Basophils Absolute 0.1 0.0 - 0.1 K/uL  Comprehensive metabolic panel     Status: Abnormal   Collection Time: 03/17/15  4:05 PM  Result Value Ref Range   Sodium 137 135 - 145 mmol/L   Potassium 4.1 3.5 - 5.1 mmol/L   Chloride 109 101 - 111 mmol/L   CO2 25 22 - 32 mmol/L   Glucose, Bld 89 65 - 99 mg/dL   BUN 15 6 - 20 mg/dL   Creatinine, Ser 7.82 0.44 - 1.00 mg/dL   Calcium 9.0 8.9 - 95.6 mg/dL   Total Protein 7.0 6.5 - 8.1 g/dL   Albumin 4.3 3.5 - 5.0 g/dL   AST 15 15 - 41  U/L   ALT 15 14 - 54 U/L   Alkaline Phosphatase 66 38 - 126 U/L   Total Bilirubin 0.3 0.3 - 1.2 mg/dL   GFR calc non Af Amer >60 >60 mL/min   GFR calc Af Amer >60 >60 mL/min   Anion gap 3 (L) 5 - 15   Ct Abdomen Pelvis W Contrast  03/17/2015   CLINICAL DATA:  Post tubal ligation on 03/14/2015, pain since 03/15/2015, lower abdominal swelling starting today, painful to urinate  EXAM: CT ABDOMEN AND PELVIS WITH CONTRAST  TECHNIQUE: Multidetector CT imaging of the abdomen and pelvis was performed using the standard protocol following bolus administration of intravenous contrast. Sagittal and coronal MPR images reconstructed from axial data set.  CONTRAST:  OMNIPAQUE IOHEXOL 300 MG/ML SOLN IV. Dilute oral contrast.  COMPARISON:  12/18/2014  FINDINGS: Lung bases clear.  Gallbladder surgically absent.  Liver, spleen, pancreas, kidneys, and adrenal glands normal appearance.  Small splenule.  Normal appendix.  Unremarkable bladder, uterus and adnexa.  Single BILATERAL nonobstructed ureters.  Postoperative changes at umbilicus.  Stomach and bowel loops normal appearance.  Normal sized inguinal lymph nodes bilaterally.  No mass, adenopathy, free air or hernia.  Small amount of dependent nonspecific free pelvic fluid consistent with preceding surgery.  Osseous structures unremarkable.  IMPRESSION: Expected postsurgical changes.  No acute intra-abdominal or intrapelvic abnormalities.   Electronically Signed   By: Ulyses Southward M.D.   On: 03/17/2015 16:52    MAU Course  Procedures  MDM Dr. Jackelyn Knife called to MAU prior to patient arrival stating need for IV fluids, CBC, CMP and CT scan today Discussed results with Dr. Jackelyn Knife. Recommends patient have 60 mg Toradol in MAU today and then she is ok for discharge and  continued Percocet and Motrin for pain. Call the office tomorrow for a follow-up on Thursday or Friday.  Patient states that she only has 2 Percocet left. Discussed with Dr. Jackelyn Knife. He states  that patient may have Rx for Percocet #10 60 mg Toradol IM given in MAU Assessment and Plan  A: Post operative pain S/P BTL  P: Discharge home Rx for Percocet given to patient Warning signs for worsening condition discussed Patient advised to follow-up with Dr. Jackelyn Knife on Thursday or Friday of this week. Patient to call the office tomorrow to make the appointment.  Patient may return to MAU as needed or if her condition were to change or worsen   Marny Lowenstein, PA-C  03/17/2015, 5:36 PM

## 2015-03-17 NOTE — MAU Note (Signed)
Patient sent by office for CT scan and evaluation for c/o severe abdominal pain following a tubal ligation on Friday. Denies discharge but does have light bleeding that she was told to expect following her surgery.

## 2015-03-17 NOTE — Discharge Instructions (Signed)
Laparoscopic Tubal Ligation °Care After °Refer to this sheet in the next few weeks. These instructions provide you with information on caring for yourself after your procedure. Your caregiver may also give you more specific instructions. Your treatment has been planned according to current medical practices, but problems sometimes occur. Call your caregiver if you have any problems or questions after your procedure. °HOME CARE INSTRUCTIONS  °· Rest the remainder of the day. °· Only take over-the-counter or prescription medicines for pain, discomfort, or fever as directed by your caregiver. Do not take aspirin. It can cause bleeding. °· Gradually resume daily activities, diet, rest, driving, and work. °· Avoid sexual intercourse for 2 weeks or as directed. °· Do not use tampons or douche. °· Do not drive while taking pain medicine. °· Do not lift anything over 5 pounds for 2 weeks or as directed. °· Only take showers, not baths, until you are seen by your caregiver. °· Change bandages (dressings) as directed. °· Take your temperature twice a day and record it. °· Try to have help for the first 7 to 10 days for your household needs. °· Return to your caregiver to get your stitches (sutures) removed and for follow-up visits as directed. °SEEK MEDICAL CARE IF:  °· You have redness, swelling, or increasing pain in a wound. °· You have drainage from a wound lasting longer than 1 day. °· Your pain is getting worse. °· You have a rash. °· You become dizzy or lightheaded. °· You have a reaction to your medicine. °· You need stronger medicine or a change in your pain medicine. °· You notice a bad smell coming from a wound or dressing. °· Your wound breaks open after the sutures have been removed. °· You are constipated. °SEEK IMMEDIATE MEDICAL CARE IF:  °· You faint. °· You have a fever. °· You have increasing abdominal pain. °· You have severe pain in your shoulders. °· You have bleeding or drainage from the suture sites or  vagina following surgery. °· You have shortness of breath or difficulty breathing. °· You have chest or leg pain. °· You have persistent nausea, vomiting, or diarrhea. °MAKE SURE YOU:  °· Understand these instructions. °· Watch your condition. °· Get help right away if you are not doing well or get worse. °Document Released: 02/19/2005 Document Revised: 02/01/2012 Document Reviewed: 11/13/2011 °[employer]® Patient Information ©2015 [employer]. This information is not intended to replace advice given to you by your health care provider. Make sure you discuss any questions you have with your health care provider. ° °

## 2015-03-18 ENCOUNTER — Ambulatory Visit (HOSPITAL_COMMUNITY): Admission: RE | Admit: 2015-03-18 | Payer: Medicaid Other | Source: Ambulatory Visit

## 2015-03-19 DIAGNOSIS — J3489 Other specified disorders of nose and nasal sinuses: Secondary | ICD-10-CM | POA: Insufficient documentation

## 2015-03-30 ENCOUNTER — Emergency Department (HOSPITAL_COMMUNITY)
Admission: EM | Admit: 2015-03-30 | Discharge: 2015-03-30 | Disposition: A | Discharge status: Home / Self Care | Payer: Medicaid Other | Attending: Emergency Medicine | Admitting: Emergency Medicine

## 2015-03-30 ENCOUNTER — Encounter (HOSPITAL_COMMUNITY): Payer: Self-pay | Admitting: *Deleted

## 2015-03-30 DIAGNOSIS — D649 Anemia, unspecified: Secondary | ICD-10-CM | POA: Diagnosis not present

## 2015-03-30 DIAGNOSIS — Z7982 Long term (current) use of aspirin: Secondary | ICD-10-CM | POA: Insufficient documentation

## 2015-03-30 DIAGNOSIS — R509 Fever, unspecified: Secondary | ICD-10-CM | POA: Diagnosis not present

## 2015-03-30 DIAGNOSIS — Z79899 Other long term (current) drug therapy: Secondary | ICD-10-CM | POA: Insufficient documentation

## 2015-03-30 DIAGNOSIS — Z8719 Personal history of other diseases of the digestive system: Secondary | ICD-10-CM | POA: Diagnosis not present

## 2015-03-30 DIAGNOSIS — J3489 Other specified disorders of nose and nasal sinuses: Secondary | ICD-10-CM | POA: Insufficient documentation

## 2015-03-30 DIAGNOSIS — J45909 Unspecified asthma, uncomplicated: Secondary | ICD-10-CM | POA: Insufficient documentation

## 2015-03-30 DIAGNOSIS — Z87440 Personal history of urinary (tract) infections: Secondary | ICD-10-CM | POA: Insufficient documentation

## 2015-03-30 DIAGNOSIS — Z793 Long term (current) use of hormonal contraceptives: Secondary | ICD-10-CM | POA: Insufficient documentation

## 2015-03-30 DIAGNOSIS — Z8619 Personal history of other infectious and parasitic diseases: Secondary | ICD-10-CM | POA: Diagnosis not present

## 2015-03-30 DIAGNOSIS — M950 Acquired deformity of nose: Secondary | ICD-10-CM | POA: Insufficient documentation

## 2015-03-30 DIAGNOSIS — Z792 Long term (current) use of antibiotics: Secondary | ICD-10-CM | POA: Insufficient documentation

## 2015-03-30 DIAGNOSIS — F419 Anxiety disorder, unspecified: Secondary | ICD-10-CM | POA: Insufficient documentation

## 2015-03-30 MED ORDER — NAPROXEN 500 MG PO TABS
500.0000 mg | ORAL_TABLET | Freq: Two times a day (BID) | ORAL | Status: DC
Start: 2015-03-30 — End: 2019-02-07

## 2015-03-30 MED ORDER — OXYCODONE-ACETAMINOPHEN 5-325 MG PO TABS
2.0000 | ORAL_TABLET | Freq: Once | ORAL | Status: AC
  Administered 2015-03-30: 2 via ORAL
  Filled 2015-03-30: qty 2

## 2015-03-30 NOTE — ED Notes (Signed)
Pt was here on 7/24 for same, reports having hole in nasal septum. Has been there for a while but now it has gotten bigger and causing more pain. Pt has appt with ENT on 9/7. Pt tearful at triage.

## 2015-03-30 NOTE — Discharge Instructions (Signed)
KEEP YOUR FOLLOW UP APPOINTMENT WITH ENT. Return for any new nasal deformity, swelling, or fever.

## 2015-03-30 NOTE — ED Provider Notes (Signed)
CSN: [id number]     Arrival date & time 03/30/15  4098 History  This chart was scribed for Catha Gosselin, PA-C, working with Margarita Grizzle, MD by Chestine Spore, ED Scribe. The patient was seen in room TR08C/TR08C at 10:53 AM.    Chief Complaint  Patient presents with  . Nose Problem      The history is provided by the patient. No language interpreter was used.    [name] is a 29 y.o. female who presents to the Emergency Department complaining of nose problem onset 1 month. Pt was seen on 03/09/15 for similar symptoms and she states she has a hole in her nose that has gotten larger and more painful. 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 Pt states that her right nares is collapsed and has been for 1.5 months. Pt has an appointment with ENT on 9/7 for her symptoms. Pt is having associated symptoms of low grade fever of 99.8. She notes that she has tried ibuprofen and tylenol with no relief of her symptoms. She denies chills, color change, wound, and any other symptoms.    Past Medical History  Diagnosis Date  . Asthma   . Headache(784.0)     migraines  . Depression   . PIH (pregnancy induced hypertension)   . Late prenatal care     22wks  . Anemia 2007  . HPV (human papilloma virus) anogenital infection   . Recurrent UTI   . Abnormal Pap smear   . Chlamydia infection     has no taken treatment as of 01/03/2013  . SVD (spontaneous vaginal delivery) 01/08/2013  . Vaginal Pap smear, abnormal   . Twin pregnancy in third trimester 08/08/2014  . Pancreatitis, acute 08/08/2014  . Pregnancy induced hypertension   . Acute gallstone pancreatitis 08/30/2014  . Anxiety    Past Surgical History  Procedure Laterality Date  . Vaginal delivery N/A 09/18/2014    Procedure: VAGINAL DELIVERY;  Surgeon: Lavina Hamman, MD;  Location: [facility];  Service: Obstetrics;  Laterality: N/A;  . Cholecystectomy  12/13/2014  . Cholecystectomy N/A 12/13/2014     Procedure: LAPAROSCOPIC CHOLECYSTECTOMY WITH INTRAOPERATIVE CHOLANGIOGRAM;  Surgeon: Manus Rudd, MD;  Location: [facility];  Service: General;  Laterality: N/A;  . Laparoscopic tubal ligation Bilateral 03/14/2015    Procedure: LAPAROSCOPIC TUBAL LIGATION;  Surgeon: Lavina Hamman, MD;  Location: [facility];  Service: Gynecology;  Laterality: Bilateral;   Family History  Problem Relation Age of Onset  . Anesthesia problems Neg Hx   . Hypotension Neg Hx   . Malignant hyperthermia Neg Hx   . Pseudochol deficiency Neg Hx   . Heart disease Maternal Grandmother     clots in heart  . Stroke Maternal Grandmother   . Diabetes Mother   . Heart disease Maternal Grandfather     pacemaker  . Miscarriages / Stillbirths Paternal Aunt   . Stroke Paternal Grandmother    Social History  Substance Use Topics  . Smoking status: Current Every Day Smoker -- 0.25 packs/day for 5 years    Types: Cigarettes  . Smokeless tobacco: Never Used  . Alcohol Use: No   OB History    Gravida Para Term Preterm AB TAB SAB Ectopic Multiple Living   6 4 3 1 2  0 2 0 1 5     Review of Systems  Constitutional: Positive for fever (low grade). Negative for chills.  Skin: Negative for color change and  wound.      Allergies  Corn syrup and Tramadol  Home Medications   Prior to Admission medications   Medication Sig Start Date End Date Taking? Authorizing Provider  acetaminophen (TYLENOL) 500 MG tablet Take 1,000 mg by mouth every 6 (six) hours as needed for mild pain or headache.    Historical Provider, MD  albuterol (PROVENTIL HFA;VENTOLIN HFA) 108 (90 BASE) MCG/ACT inhaler Inhale 2 puffs into the lungs every 6 (six) hours as needed for wheezing or shortness of breath. Patient stated needs  new prescription    Historical Provider, MD  aspirin-acetaminophen-caffeine (EXCEDRIN MIGRAINE) [phone] MG per tablet Take 2 tablets by mouth every 6 (six) hours as needed for headache.    Historical Provider, MD  busPIRone  (BUSPAR) 15 MG tablet Take 15 mg by mouth 3 (three) times daily.    Historical Provider, MD  diphenhydrAMINE (BENADRYL) 25 MG tablet Take 1 tablet by mouth daily as needed.    Historical Provider, MD  doxycycline (VIBRAMYCIN) 100 MG capsule Take 1 capsule (100 mg total) by mouth 2 (two) times daily. One po bid x 7 days Patient not taking: Reported on 03/17/2015 03/09/15   Trixie Dredge, PA-C  ferrous sulfate 325 (65 FE) MG tablet Take 1 tablet (325 mg total) by mouth daily with breakfast. Patient taking differently: Take 325 mg by mouth 3 (three) times daily with meals.  08/10/14   Huel Cote, MD  ibuprofen (ADVIL,MOTRIN) 800 MG tablet Take 1 tablet (800 mg total) by mouth every 8 (eight) hours as needed for mild pain or moderate pain. 03/09/15   Trixie Dredge, PA-C  medroxyPROGESTERone (DEPO-PROVERA) 150 MG/ML injection Inject 150 mg into the muscle every 3 (three) months.    Historical Provider, MD  naproxen (NAPROSYN) 500 MG tablet Take 1 tablet (500 mg total) by mouth 2 (two) times daily. 03/30/15   Artur Winningham Patel-Mills, PA-C  OXcarbazepine (TRILEPTAL) 150 MG tablet Take 150 mg by mouth 2 (two) times daily.    Historical Provider, MD  oxyCODONE-acetaminophen (PERCOCET/ROXICET) 5-325 MG per tablet Take 1 tablet by mouth every 4 (four) hours as needed for severe pain.    Historical Provider, MD  oxyCODONE-acetaminophen (PERCOCET/ROXICET) 5-325 MG per tablet Take 1 tablet by mouth every 6 (six) hours as needed for severe pain. 03/17/15   Marny Lowenstein, PA-C  pseudoephedrine (SUDAFED) 120 MG 12 hr tablet Take 120 mg by mouth daily as needed for congestion.     Historical Provider, MD  sertraline (ZOLOFT) 100 MG tablet Take 150 mg by mouth daily.     Historical Provider, MD   BP 125/77 mmHg  Pulse 107  Temp(Src) 98.7 F (37.1 C) (Oral)  Resp 18  SpO2 99%  LMP  (LMP Unknown) Physical Exam  Constitutional: She is oriented to person, place, and time. She appears well-developed and well-nourished. No  distress.  HENT:  Head: Normocephalic and atraumatic.  Nose: Nasal deformity present. No rhinorrhea, sinus tenderness, septal deviation or nasal septal hematoma. No epistaxis.  No foreign bodies.  No septal deviation or abscess. Patient has partially closed right near but no signs of abscess. No tenderness to palpation of the nasal bridge or septum.  Eyes: Conjunctivae and EOM are normal.  Neck: Neck supple. No tracheal deviation present.  Cardiovascular: Normal rate.   Pulmonary/Chest: Effort normal. No respiratory distress.  Musculoskeletal: Normal range of motion.  Neurological: She is alert and oriented to person, place, and time.  Skin: Skin is warm and dry.  Psychiatric: She has  a normal mood and affect. Her behavior is normal.  Nursing note and vitals reviewed.   ED Course  Procedures (including critical care time) DIAGNOSTIC STUDIES: Oxygen Saturation is 99% on RA, nl by my interpretation.    COORDINATION OF CARE: 11:00 AM Discussed treatment plan with pt at bedside and pt agreed to plan.   Labs Review Labs Reviewed - No data to display  Imaging Review No results found. I, Catha Gosselin, personally reviewed and evaluated these images and lab results as part of my medical decision-making.    EKG Interpretation None      MDM   Final diagnoses:  Nasal pain  I'm not concerned for nasal abscess or septal deviation, abscess or perforation. She has a follow-up appointment on September 7 with ENT. Her vitals are stable here. She is well-appearing and in no acute distress. I think she just needs pain management. She was given naproxen for pain. Medications  oxyCODONE-acetaminophen (PERCOCET/ROXICET) 5-325 MG per tablet 2 tablet (2 tablets Oral Given 03/30/15 1106)  I personally performed the services described in this documentation, which was scribed in my presence. The recorded information has been reviewed and is accurate.    Catha Gosselin, PA-C 03/30/15  1155  Margarita Grizzle, MD 03/30/15 1630

## 2015-04-06 ENCOUNTER — Emergency Department (HOSPITAL_COMMUNITY)
Admission: EM | Admit: 2015-04-06 | Discharge: 2015-04-06 | Disposition: A | Discharge status: Home / Self Care | Payer: Medicaid Other | Attending: Emergency Medicine | Admitting: Emergency Medicine

## 2015-04-06 ENCOUNTER — Encounter (HOSPITAL_COMMUNITY): Payer: Self-pay | Admitting: *Deleted

## 2015-04-06 DIAGNOSIS — D649 Anemia, unspecified: Secondary | ICD-10-CM | POA: Insufficient documentation

## 2015-04-06 DIAGNOSIS — R070 Pain in throat: Secondary | ICD-10-CM | POA: Diagnosis not present

## 2015-04-06 DIAGNOSIS — Z793 Long term (current) use of hormonal contraceptives: Secondary | ICD-10-CM | POA: Insufficient documentation

## 2015-04-06 DIAGNOSIS — Z8619 Personal history of other infectious and parasitic diseases: Secondary | ICD-10-CM | POA: Diagnosis not present

## 2015-04-06 DIAGNOSIS — Z87440 Personal history of urinary (tract) infections: Secondary | ICD-10-CM | POA: Diagnosis not present

## 2015-04-06 DIAGNOSIS — H9203 Otalgia, bilateral: Secondary | ICD-10-CM | POA: Diagnosis not present

## 2015-04-06 DIAGNOSIS — Z791 Long term (current) use of non-steroidal anti-inflammatories (NSAID): Secondary | ICD-10-CM | POA: Insufficient documentation

## 2015-04-06 DIAGNOSIS — F419 Anxiety disorder, unspecified: Secondary | ICD-10-CM | POA: Insufficient documentation

## 2015-04-06 DIAGNOSIS — Z79899 Other long term (current) drug therapy: Secondary | ICD-10-CM | POA: Diagnosis not present

## 2015-04-06 DIAGNOSIS — G43909 Migraine, unspecified, not intractable, without status migrainosus: Secondary | ICD-10-CM | POA: Insufficient documentation

## 2015-04-06 DIAGNOSIS — J45909 Unspecified asthma, uncomplicated: Secondary | ICD-10-CM | POA: Insufficient documentation

## 2015-04-06 DIAGNOSIS — F329 Major depressive disorder, single episode, unspecified: Secondary | ICD-10-CM | POA: Insufficient documentation

## 2015-04-06 DIAGNOSIS — J321 Chronic frontal sinusitis: Secondary | ICD-10-CM | POA: Insufficient documentation

## 2015-04-06 DIAGNOSIS — Z8719 Personal history of other diseases of the digestive system: Secondary | ICD-10-CM | POA: Insufficient documentation

## 2015-04-06 DIAGNOSIS — Z720 Tobacco use: Secondary | ICD-10-CM | POA: Diagnosis not present

## 2015-04-06 DIAGNOSIS — R51 Headache: Secondary | ICD-10-CM | POA: Diagnosis present

## 2015-04-06 MED ORDER — ONDANSETRON HCL 4 MG PO TABS
4.0000 mg | ORAL_TABLET | Freq: Once | ORAL | Status: AC
  Administered 2015-04-06: 4 mg via ORAL
  Filled 2015-04-06: qty 1

## 2015-04-06 MED ORDER — ACETAMINOPHEN 325 MG PO TABS
325.0000 mg | ORAL_TABLET | Freq: Once | ORAL | Status: AC
  Administered 2015-04-06: 325 mg via ORAL
  Filled 2015-04-06: qty 1

## 2015-04-06 NOTE — ED Provider Notes (Signed)
CSN: [id number]     Arrival date & time 04/06/15  1349 History   First MD Initiated Contact with Patient 04/06/15 1632     Chief Complaint  Patient presents with  . Facial Pain    HPI   46 female presents today with complaints of sinus pressure pain. Patient has been seen twice previously on 03/09/2015, and 03/30/2015 with similar complaints. She also notes that she seen by her primary care provider. She has been prescribed doxycycline, amoxicillin, Augmentin with only temporary relief of symptoms. Patient reports she has frontal and maxillary sinus pressure and pain causing a headache. She reports development of bilaterally ear and throat pain today. Patient notes multiple sinus infections per year, usually managed by amoxicillin. Patient reports she has a follow-up evaluation with ENT on 04/23/2015. Patient reports that her Tylenol and ibuprofen at home have not been helping. Patient denies difficulty swallowing, breathing, cough, fever chills, nausea vomiting, chest pain shortness of breath. She denies purulent nasal discharge, difficulty hearing.   Past Medical History  Diagnosis Date  . Asthma   . Headache(784.0)     migraines  . Depression   . PIH (pregnancy induced hypertension)   . Late prenatal care     22wks  . Anemia 2007  . HPV (human papilloma virus) anogenital infection   . Recurrent UTI   . Abnormal Pap smear   . Chlamydia infection     has no taken treatment as of 01/03/2013  . SVD (spontaneous vaginal delivery) 01/08/2013  . Vaginal Pap smear, abnormal   . Twin pregnancy in third trimester 08/08/2014  . Pancreatitis, acute 08/08/2014  . Pregnancy induced hypertension   . Acute gallstone pancreatitis 08/30/2014  . Anxiety    Past Surgical History  Procedure Laterality Date  . Vaginal delivery N/A 09/18/2014    Procedure: VAGINAL DELIVERY;  Surgeon: Lavina Hamman, MD;  Location: [facility];  Service: Obstetrics;  Laterality: N/A;  . Cholecystectomy  12/13/2014  .  Cholecystectomy N/A 12/13/2014    Procedure: LAPAROSCOPIC CHOLECYSTECTOMY WITH INTRAOPERATIVE CHOLANGIOGRAM;  Surgeon: Manus Rudd, MD;  Location: [facility];  Service: General;  Laterality: N/A;  . Laparoscopic tubal ligation Bilateral 03/14/2015    Procedure: LAPAROSCOPIC TUBAL LIGATION;  Surgeon: Lavina Hamman, MD;  Location: [facility];  Service: Gynecology;  Laterality: Bilateral;   Family History  Problem Relation Age of Onset  . Anesthesia problems Neg Hx   . Hypotension Neg Hx   . Malignant hyperthermia Neg Hx   . Pseudochol deficiency Neg Hx   . Heart disease Maternal Grandmother     clots in heart  . Stroke Maternal Grandmother   . Diabetes Mother   . Heart disease Maternal Grandfather     pacemaker  . Miscarriages / Stillbirths Paternal Aunt   . Stroke Paternal Grandmother    Social History  Substance Use Topics  . Smoking status: Current Every Day Smoker -- 0.25 packs/day for 5 years    Types: Cigarettes  . Smokeless tobacco: Never Used  . Alcohol Use: No   OB History    Gravida Para Term Preterm AB TAB SAB Ectopic Multiple Living   6 4 3 1 2  0 2 0 1 5     Review of Systems  All other systems reviewed and are negative.   Allergies  Corn syrup and Tramadol  Home Medications   Prior to Admission medications   Medication Sig Start Date End Date Taking? Authorizing Provider  acetaminophen (TYLENOL) 500 MG tablet Take 1,000 mg  by mouth every 6 (six) hours as needed for mild pain or headache.    Historical Provider, MD  albuterol (PROVENTIL HFA;VENTOLIN HFA) 108 (90 BASE) MCG/ACT inhaler Inhale 2 puffs into the lungs every 6 (six) hours as needed for wheezing or shortness of breath. Patient stated needs  new prescription    Historical Provider, MD  aspirin-acetaminophen-caffeine (EXCEDRIN MIGRAINE) [phone] MG per tablet Take 2 tablets by mouth every 6 (six) hours as needed for headache.    Historical Provider, MD  busPIRone (BUSPAR) 15 MG tablet Take 15 mg by mouth 3  (three) times daily.    Historical Provider, MD  diphenhydrAMINE (BENADRYL) 25 MG tablet Take 1 tablet by mouth daily as needed.    Historical Provider, MD  doxycycline (VIBRAMYCIN) 100 MG capsule Take 1 capsule (100 mg total) by mouth 2 (two) times daily. One po bid x 7 days Patient not taking: Reported on 03/17/2015 03/09/15   Trixie Dredge, PA-C  ferrous sulfate 325 (65 FE) MG tablet Take 1 tablet (325 mg total) by mouth daily with breakfast. Patient taking differently: Take 325 mg by mouth 3 (three) times daily with meals.  08/10/14   Huel Cote, MD  ibuprofen (ADVIL,MOTRIN) 800 MG tablet Take 1 tablet (800 mg total) by mouth every 8 (eight) hours as needed for mild pain or moderate pain. 03/09/15   Trixie Dredge, PA-C  medroxyPROGESTERone (DEPO-PROVERA) 150 MG/ML injection Inject 150 mg into the muscle every 3 (three) months.    Historical Provider, MD  naproxen (NAPROSYN) 500 MG tablet Take 1 tablet (500 mg total) by mouth 2 (two) times daily. 03/30/15   Hanna Patel-Mills, PA-C  OXcarbazepine (TRILEPTAL) 150 MG tablet Take 150 mg by mouth 2 (two) times daily.    Historical Provider, MD  oxyCODONE-acetaminophen (PERCOCET/ROXICET) 5-325 MG per tablet Take 1 tablet by mouth every 4 (four) hours as needed for severe pain.    Historical Provider, MD  oxyCODONE-acetaminophen (PERCOCET/ROXICET) 5-325 MG per tablet Take 1 tablet by mouth every 6 (six) hours as needed for severe pain. 03/17/15   Marny Lowenstein, PA-C  pseudoephedrine (SUDAFED) 120 MG 12 hr tablet Take 120 mg by mouth daily as needed for congestion.     Historical Provider, MD  sertraline (ZOLOFT) 100 MG tablet Take 150 mg by mouth daily.     Historical Provider, MD   BP 129/83 mmHg  Pulse 72  Temp(Src) 99 F (37.2 C) (Oral)  Resp 16  SpO2 100%    Physical Exam  Constitutional: She is oriented to person, place, and time. She appears well-developed and well-nourished.  HENT:  Head: Normocephalic and atraumatic.  Right Ear: External ear  normal.  Left Ear: External ear normal.  External exam of the head and face show no obvious signs of swelling or deformity. Nasal septum with perforation, naris patent with minimal erythema no purulent discharge noted. Oropharynx clear no signs of edema, exudate, swelling  Eyes: Conjunctivae are normal. Pupils are equal, round, and reactive to light. Right eye exhibits no discharge. Left eye exhibits no discharge. No scleral icterus.  Neck: Normal range of motion. No JVD present. No tracheal deviation present.  Cardiovascular: Normal rate, regular rhythm, normal heart sounds and intact distal pulses.  Exam reveals no gallop and no friction rub.   No murmur heard. Pulmonary/Chest: Effort normal and breath sounds normal. No stridor. No respiratory distress. She has no wheezes. She has no rales. She exhibits no tenderness.  Neurological: She is alert and oriented to person, place,  and time. Coordination normal.  Psychiatric: She has a normal mood and affect. Her behavior is normal. Judgment and thought content normal.  Nursing note and vitals reviewed.   ED Course  Procedures (including critical care time) Labs Review Labs Reviewed - No data to display  Imaging Review No results found. I have personally reviewed and evaluated these images and lab results as part of my medical decision-making.   EKG Interpretation None      MDM   Final diagnoses:  Chronic frontal sinusitis    Labs:   Imaging:  Consults:  Therapeutics: Zofran  Discharge Meds:   Assessment/Plan: 29 year old female presents today for the third time for frontal maxillary pain. Patient has been seen by her primary care provider as well as the ED, has had numerous antibiotics with no significant improvement in symptoms. Patient has no objective findings on physical exam that would indicate infection. Patient reports she has ENT follow-up, she is encouraged to follow up with this, follow up with her primary care  provider. She was given strict return precautions. At this time of discharge patient reports that she was slightly nauseated she was given a dose of Zofran.         Eyvonne Mechanic, PA-C 04/06/15 1719  Lorre Nick, MD 04/08/15 [phone]

## 2015-04-06 NOTE — ED Notes (Signed)
Pt placed into gown and on monitor upon arrival to room. Pt monitored by blood pressure and pulse ox.  

## 2015-04-06 NOTE — Discharge Instructions (Signed)
Sinusitis °Sinusitis is redness, soreness, and inflammation of the paranasal sinuses. Paranasal sinuses are air pockets within the bones of your face (beneath the eyes, the middle of the forehead, or above the eyes). In healthy paranasal sinuses, mucus is able to drain out, and air is able to circulate through them by way of your nose. However, when your paranasal sinuses are inflamed, mucus and air can become trapped. This can allow bacteria and other germs to grow and cause infection. °Sinusitis can develop quickly and last only a short time (acute) or continue over a long period (chronic). Sinusitis that lasts for more than 12 weeks is considered chronic.  °CAUSES  °Causes of sinusitis include: °· Allergies. °· Structural abnormalities, such as displacement of the cartilage that separates your nostrils (deviated septum), which can decrease the air flow through your nose and sinuses and affect sinus drainage. °· Functional abnormalities, such as when the small hairs (cilia) that line your sinuses and help remove mucus do not work properly or are not present. °SIGNS AND SYMPTOMS  °Symptoms of acute and chronic sinusitis are the same. The primary symptoms are pain and pressure around the affected sinuses. Other symptoms include: °· Upper toothache. °· Earache. °· Headache. °· Bad breath. °· Decreased sense of smell and taste. °· A cough, which worsens when you are lying flat. °· Fatigue. °· Fever. °· Thick drainage from your nose, which often is green and may contain pus (purulent). °· Swelling and warmth over the affected sinuses. °DIAGNOSIS  °Your health care provider will perform a physical exam. During the exam, your health care provider may: °· Look in your nose for signs of abnormal growths in your nostrils (nasal polyps). °· Tap over the affected sinus to check for signs of infection. °· View the inside of your sinuses (endoscopy) using an imaging device that has a light attached (endoscope). °If your health  care provider suspects that you have chronic sinusitis, one or more of the following tests may be recommended: °· Allergy tests. °· Nasal culture. A sample of mucus is taken from your nose, sent to a lab, and screened for bacteria. °· Nasal cytology. A sample of mucus is taken from your nose and examined by your health care provider to determine if your sinusitis is related to an allergy. °TREATMENT  °Most cases of acute sinusitis are related to a viral infection and will resolve on their own within 10 days. Sometimes medicines are prescribed to help relieve symptoms (pain medicine, decongestants, nasal steroid sprays, or saline sprays).  °However, for sinusitis related to a bacterial infection, your health care provider will prescribe antibiotic medicines. These are medicines that will help kill the bacteria causing the infection.  °Rarely, sinusitis is caused by a fungal infection. In theses cases, your health care provider will prescribe antifungal medicine. °For some cases of chronic sinusitis, surgery is needed. Generally, these are cases in which sinusitis recurs more than 3 times per year, despite other treatments. °HOME CARE INSTRUCTIONS  °· Drink plenty of water. Water helps thin the mucus so your sinuses can drain more easily. °· Use a humidifier. °· Inhale steam 3 to 4 times a day (for example, sit in the bathroom with the shower running). °· Apply a warm, moist washcloth to your face 3 to 4 times a day, or as directed by your health care provider. °· Use saline nasal sprays to help moisten and clean your sinuses. °· Take medicines only as directed by your health care provider. °·   If you were prescribed either an antibiotic or antifungal medicine, finish it all even if you start to feel better. SEEK IMMEDIATE MEDICAL CARE IF:  You have increasing pain or severe headaches.  You have nausea, vomiting, or drowsiness.  You have swelling around your face.  You have vision problems.  You have a stiff  neck.  You have difficulty breathing. MAKE SURE YOU:   Understand these instructions.  Will watch your condition.  Will get help right away if you are not doing well or get worse. Document Released: 08/02/2005 Document Revised: 12/17/2013 Document Reviewed: 08/17/2011 [facility] Patient Information 2015 [place], [place]. This information is not intended to replace advice given to you by your health care provider. Make sure you discuss any questions you have with your health care provider.  Please follow-up with ENT in her primary care provider for further evaluation and management.

## 2015-04-06 NOTE — ED Notes (Signed)
Pt in stating she was dx with a sinus infection last week, symptoms are worse, c/o headache also, denies n/v or fever

## 2016-11-05 ENCOUNTER — Encounter (HOSPITAL_COMMUNITY): Payer: Self-pay | Admitting: *Deleted

## 2016-11-05 ENCOUNTER — Emergency Department (HOSPITAL_COMMUNITY)
Admission: EM | Admit: 2016-11-05 | Discharge: 2016-11-05 | Disposition: A | Discharge status: Home / Self Care | Payer: Medicaid Other | Attending: Emergency Medicine | Admitting: Emergency Medicine

## 2016-11-05 DIAGNOSIS — Z79899 Other long term (current) drug therapy: Secondary | ICD-10-CM | POA: Diagnosis not present

## 2016-11-05 DIAGNOSIS — R591 Generalized enlarged lymph nodes: Secondary | ICD-10-CM | POA: Insufficient documentation

## 2016-11-05 DIAGNOSIS — J45909 Unspecified asthma, uncomplicated: Secondary | ICD-10-CM | POA: Diagnosis not present

## 2016-11-05 DIAGNOSIS — F17210 Nicotine dependence, cigarettes, uncomplicated: Secondary | ICD-10-CM | POA: Diagnosis not present

## 2016-11-05 DIAGNOSIS — L02411 Cutaneous abscess of right axilla: Secondary | ICD-10-CM

## 2016-11-05 DIAGNOSIS — Z7982 Long term (current) use of aspirin: Secondary | ICD-10-CM | POA: Diagnosis not present

## 2016-11-05 DIAGNOSIS — R202 Paresthesia of skin: Secondary | ICD-10-CM

## 2016-11-05 DIAGNOSIS — R590 Localized enlarged lymph nodes: Secondary | ICD-10-CM

## 2016-11-05 MED ORDER — SULFAMETHOXAZOLE-TRIMETHOPRIM 800-160 MG PO TABS: 1.0000 | ORAL_TABLET | Freq: Two times a day (BID) | ORAL | 0 refills | Status: DC

## 2016-11-05 NOTE — ED Provider Notes (Signed)
[facility]-EMERGENCY DEPT Provider Note   CSN: [id number] Arrival date & time: 11/05/16  1626     History   Chief Complaint Chief Complaint  Patient presents with  . Abscess    HPI SHAMIAH [name] is a 31 y.o. female with a PMHx of asthma and HTN in pregnancy, and prior abscess in groin, who presents to the ED with complaints of right axilla abscess 2 days. Patient states that on Wednesday morning she awoke with pain and swelling under her right axilla, and noticed "a boil". She describes the pain as 8/10 constant sharp throbbing right armpit pain radiating into the right hand, worse with cold air exposure and palpation or movement, and mildly improved with warm compresses, Epsom salts soaks, Tylenol, and ibuprofen. She reports associated intermittent tingling in her right fourth and fifth fingers, as well as warmth to the abscess, and intermittent nausea. She denies noticing any erythema or red streaking, no drainage. She denies any recent skin injuries, or insect bites that she is aware of, and reports that she has not changed her arm pits in about 2 weeks. Denies IV drug use. She also denies any fevers, chills, CP, SOB, abd pain, V/D/C, hematuria, dysuria, arthralgias, numbness, focal weakness, or any other complaints at this time. Has a remote hx of an abscess in her groin.   The history is provided by the patient and medical records. No language interpreter was used.  Abscess  Location:  Shoulder/arm Shoulder/arm abscess location:  R axilla Abscess quality: painful and warmth   Abscess quality: not draining, no fluctuance and no redness   Red streaking: no   Duration:  2 days Progression:  Unchanged Pain details:    Quality:  Sharp and throbbing   Severity:  Moderate   Duration:  2 days   Timing:  Constant   Progression:  Unchanged Chronicity:  New Context: not diabetes, not immunosuppression, not injected drug use, not insect bite/sting and not skin injury   Relieved by:  Warm  compresses, warm water soaks and NSAIDs Exacerbated by: cold air, and arm movement. Ineffective treatments:  None tried Associated symptoms: nausea (intermittent)   Associated symptoms: no fever and no vomiting   Risk factors: prior abscess     Past Medical History:  Diagnosis Date  . Abnormal Pap smear   . Acute gallstone pancreatitis 08/30/2014  . Anemia 2007  . Anxiety   . Asthma   . Chlamydia infection    has no taken treatment as of 01/03/2013  . Depression   . Headache(784.0)    migraines  . HPV (human papilloma virus) anogenital infection   . Late prenatal care    22wks  . Pancreatitis, acute 08/08/2014  . PIH (pregnancy induced hypertension)   . Pregnancy induced hypertension   . Recurrent UTI   . SVD (spontaneous vaginal delivery) 01/08/2013  . Twin pregnancy in third trimester 08/08/2014  . Vaginal Pap smear, abnormal     Patient Active Problem List   Diagnosis Date Noted  . Acute cholecystitis 12/13/2014  . SVD (spontaneous vaginal delivery) 09/18/2014  . Breech extraction, delivered 09/18/2014  . Acute gallstone pancreatitis 08/30/2014  . Twin pregnancy in third trimester 08/08/2014  . Pancreatitis, acute 08/08/2014  . Asthma 06/13/2011  . Depression 06/13/2011    Past Surgical History:  Procedure Laterality Date  . CHOLECYSTECTOMY  12/13/2014  . CHOLECYSTECTOMY N/A 12/13/2014   Procedure: LAPAROSCOPIC CHOLECYSTECTOMY WITH INTRAOPERATIVE CHOLANGIOGRAM;  Surgeon: Manus Rudd, MD;  Location: [facility] OR;  Service: General;  Laterality: N/A;  . LAPAROSCOPIC TUBAL LIGATION Bilateral 03/14/2015   Procedure: LAPAROSCOPIC TUBAL LIGATION;  Surgeon: Lavina Hamman, MD;  Location: [facility] ORS;  Service: Gynecology;  Laterality: Bilateral;  . VAGINAL DELIVERY N/A 09/18/2014   Procedure: VAGINAL DELIVERY;  Surgeon: Lavina Hamman, MD;  Location: [facility] ORS;  Service: Obstetrics;  Laterality: N/A;    OB History    Gravida Para Term Preterm AB Living   6 4 3 1 2 5    SAB TAB Ectopic  Multiple Live Births   2 0 0 1 5       Home Medications    Prior to Admission medications   Medication Sig Start Date End Date Taking? Authorizing Provider  acetaminophen (TYLENOL) 500 MG tablet Take 1,000 mg by mouth every 6 (six) hours as needed for mild pain or headache.    Historical Provider, MD  albuterol (PROVENTIL HFA;VENTOLIN HFA) 108 (90 BASE) MCG/ACT inhaler Inhale 2 puffs into the lungs every 6 (six) hours as needed for wheezing or shortness of breath. Patient stated needs  new prescription    Historical Provider, MD  aspirin-acetaminophen-caffeine (EXCEDRIN MIGRAINE) [id number] MG per tablet Take 2 tablets by mouth every 6 (six) hours as needed for headache.    Historical Provider, MD  busPIRone (BUSPAR) 15 MG tablet Take 15 mg by mouth 3 (three) times daily.    Historical Provider, MD  diphenhydrAMINE (BENADRYL) 25 MG tablet Take 1 tablet by mouth daily as needed.    Historical Provider, MD  doxycycline (VIBRAMYCIN) 100 MG capsule Take 1 capsule (100 mg total) by mouth 2 (two) times daily. One po bid x 7 days Patient not taking: Reported on 03/17/2015 03/09/15   Trixie Dredge, PA-C  ferrous sulfate 325 (65 FE) MG tablet Take 1 tablet (325 mg total) by mouth daily with breakfast. Patient taking differently: Take 325 mg by mouth 3 (three) times daily with meals.  08/10/14   Huel Cote, MD  ibuprofen (ADVIL,MOTRIN) 800 MG tablet Take 1 tablet (800 mg total) by mouth every 8 (eight) hours as needed for mild pain or moderate pain. 03/09/15   Trixie Dredge, PA-C  medroxyPROGESTERone (DEPO-PROVERA) 150 MG/ML injection Inject 150 mg into the muscle every 3 (three) months.    Historical Provider, MD  naproxen (NAPROSYN) 500 MG tablet Take 1 tablet (500 mg total) by mouth 2 (two) times daily. 03/30/15   Hanna Patel-Mills, PA-C  OXcarbazepine (TRILEPTAL) 150 MG tablet Take 150 mg by mouth 2 (two) times daily.    Historical Provider, MD  oxyCODONE-acetaminophen (PERCOCET/ROXICET) 5-325 MG per  tablet Take 1 tablet by mouth every 4 (four) hours as needed for severe pain.    Historical Provider, MD  oxyCODONE-acetaminophen (PERCOCET/ROXICET) 5-325 MG per tablet Take 1 tablet by mouth every 6 (six) hours as needed for severe pain. 03/17/15   Marny Lowenstein, PA-C  pseudoephedrine (SUDAFED) 120 MG 12 hr tablet Take 120 mg by mouth daily as needed for congestion.     Historical Provider, MD  sertraline (ZOLOFT) 100 MG tablet Take 150 mg by mouth daily.     Historical Provider, MD    Family History Family History  Problem Relation Age of Onset  . Diabetes Mother   . Heart disease Maternal Grandmother     clots in heart  . Stroke Maternal Grandmother   . Heart disease Maternal Grandfather     pacemaker  . Miscarriages / Stillbirths Paternal Aunt   . Stroke Paternal Grandmother   . Anesthesia problems Neg Hx   .  Hypotension Neg Hx   . Malignant hyperthermia Neg Hx   . Pseudochol deficiency Neg Hx     Social History Social History  Substance Use Topics  . Smoking status: Current Every Day Smoker    Packs/day: 0.25    Years: 5.00    Types: Cigarettes  . Smokeless tobacco: Never Used  . Alcohol use No     Allergies   Corn syrup; Tramadol; and Ketorolac   Review of Systems Review of Systems  Constitutional: Negative for chills and fever.  Respiratory: Negative for shortness of breath.   Cardiovascular: Negative for chest pain.  Gastrointestinal: Positive for nausea (intermittent). Negative for abdominal pain, constipation, diarrhea and vomiting.  Genitourinary: Negative for dysuria and hematuria.  Musculoskeletal: Positive for myalgias (R axilla). Negative for arthralgias.  Skin: Positive for color change (R axilla abscess with erythema and warmth; no red streaking or drainage).  Allergic/Immunologic: Negative for immunocompromised state.  Neurological: Negative for weakness and numbness.       +intermittent tingling R 4-5th digits  Psychiatric/Behavioral: Negative for  confusion.   10 Systems reviewed and are negative for acute change except as noted in the HPI.   Physical Exam Updated Vital Signs BP 126/64 (BP Location: Left Arm)   Pulse 72   Temp 98.4 F (36.9 C) (Oral)   Resp 18   Wt 78.1 kg   SpO2 100%   BMI 26.97 kg/m   Physical Exam  Constitutional: She is oriented to person, place, and time. Vital signs are normal. She appears well-developed and well-nourished.  Non-toxic appearance. No distress.  Afebrile, nontoxic, NAD  HENT:  Head: Normocephalic and atraumatic.  Mouth/Throat: Mucous membranes are normal.  Eyes: Conjunctivae and EOM are normal. Right eye exhibits no discharge. Left eye exhibits no discharge.  Neck: Normal range of motion. Neck supple.  Cardiovascular: Normal rate and intact distal pulses.   Pulmonary/Chest: Effort normal. No respiratory distress.  Abdominal: Normal appearance. She exhibits no distension.  Musculoskeletal: Normal range of motion.  MAE x4 Strength and sensation grossly intact in all extremities, grip strength preserved in R hand, sensation to all digits preserved and intact Distal pulses intact FROM intact in RUE joints No joint swelling, erythema, or warmth to entire RUE  Lymphadenopathy:    She has axillary adenopathy.       Right axillary: No pectoral and no lateral adenopathy present.  R axillary abscess vs lymphadenopathy, as mentioned below; no other pectoral or lateral chest LAD, no other RUE LAD.  Neurological: She is alert and oriented to person, place, and time. She has normal strength. No sensory deficit.  Skin: Skin is warm, dry and intact. No rash noted. There is erythema.  Small ~0.5cm indurated abscess vs LAD to R axilla, mildly erythematous overtop, mildly TTP, without warmth or red streaking, no fluctuance or drainage.   Psychiatric: She has a normal mood and affect. Her behavior is normal.  Nursing note and vitals reviewed.    ED Treatments / Results  Labs (all labs ordered  are listed, but only abnormal results are displayed) Labs Reviewed - No data to display  EKG  EKG Interpretation None       Radiology No results found.  Procedures Procedures (including critical care time)  Medications Ordered in ED Medications - No data to display   Initial Impression / Assessment and Plan / ED Course  I have reviewed the triage vital signs and the nursing notes.  Pertinent labs & imaging results that were available during  my care of the patient were reviewed by me and considered in my medical decision making (see chart for details).     31 y.o. female here with small swollen area in R axilla x2 days. On exam, appears to be either indurated abscess vs ?solitary lymphadenopathy or cyst; area is indurated with mild erythema, suggesting abscess as a more likely etiology; no warmth or red streaking, no drainage, no fluctuance, not likely amendable to I&D at this time. Extremities NVI with soft compartments, no adenopathy in the surrounding area. Will treat as abscess, will start on bactrim, discussed warm compresses/tylenol/motrin for pain, and advised PCP f/up in 2-3 days for recheck; if amendable to I&D at that time, may consider performing this, however today it doesn't seem amendable to this. Pt c/o some mild paresthesias in 4th-5th digits, could be from mild compression on nerves in brachial plexus, however grip strength preserved and NVI including in those digits. Doubt need for further emergent work up at this time. F/up with PCP in 2-3 days for recheck and ongoing management. I explained the diagnosis and have given explicit precautions to return to the ER including for any other new or worsening symptoms. The patient understands and accepts the medical plan as it's been dictated and I have answered their questions. Discharge instructions concerning home care and prescriptions have been given. The patient is STABLE and is discharged to home in good condition.    Final Clinical Impressions(s) / ED Diagnoses   Final diagnoses:  Abscess of axilla, right  Lymphadenopathy, axillary  Paresthesias    New Prescriptions New Prescriptions   SULFAMETHOXAZOLE-TRIMETHOPRIM (BACTRIM DS,SEPTRA DS) 800-160 MG TABLET    Take 1 tablet by mouth 2 (two) times daily.     [address]Mercedes Aideen Fenster, PA-C 11/05/16 1806    Lavera Guiseana Duo Liu, MD 11/06/16 [phone]

## 2016-11-05 NOTE — Discharge Instructions (Signed)
Your armpit may have an abscess, or it could be a lymph node or cyst, which could be causing some mild compression on the nerves in your arm, giving you the tingling sensation in the hand. Keep area clean and dry. Apply warm compresses to affected area throughout the day. Take antibiotic until it is finished. Alternate between tylenol and motrin as directed, as needed for pain. [facility]-4 days for wound recheck and recheck of ongoing symptoms. Monitor area for signs of infection to include, but not limited to: increasing pain, spreading redness, drainage/pus, worsening swelling, or fevers. Return to emergency department for emergent changing or worsening symptoms.

## 2016-11-05 NOTE — ED Triage Notes (Signed)
Pt complains of painful boil to right underarm for the past 2 days. Pt has tried warm compresses which provides temporary relief.

## 2016-11-05 NOTE — ED Notes (Signed)
Discharge instructions, follow up care, and rx x1 reviewed with patient. Patient verbalized understanding. 

## 2016-11-06 ENCOUNTER — Emergency Department (HOSPITAL_COMMUNITY)
Admission: EM | Admit: 2016-11-06 | Discharge: 2016-11-06 | Disposition: A | Discharge status: Home / Self Care | Payer: Medicaid Other | Attending: Emergency Medicine | Admitting: Emergency Medicine

## 2016-11-06 ENCOUNTER — Encounter (HOSPITAL_COMMUNITY): Payer: Self-pay | Admitting: Emergency Medicine

## 2016-11-06 DIAGNOSIS — J45909 Unspecified asthma, uncomplicated: Secondary | ICD-10-CM | POA: Diagnosis not present

## 2016-11-06 DIAGNOSIS — Z7982 Long term (current) use of aspirin: Secondary | ICD-10-CM | POA: Insufficient documentation

## 2016-11-06 DIAGNOSIS — L02411 Cutaneous abscess of right axilla: Secondary | ICD-10-CM | POA: Diagnosis present

## 2016-11-06 DIAGNOSIS — F17210 Nicotine dependence, cigarettes, uncomplicated: Secondary | ICD-10-CM | POA: Diagnosis not present

## 2016-11-06 DIAGNOSIS — Z79899 Other long term (current) drug therapy: Secondary | ICD-10-CM | POA: Insufficient documentation

## 2016-11-06 MED ORDER — LIDOCAINE HCL (PF) 1 % IJ SOLN
5.0000 mL | Freq: Once | INTRAMUSCULAR | Status: AC
  Administered 2016-11-06: 5 mL
  Filled 2016-11-06: qty 5

## 2016-11-06 NOTE — Discharge Instructions (Signed)
Please read and follow all provided instructions.  Your diagnoses today include:  1. Abscess of axilla, right    Tests performed today include: Vital signs. See below for your results today.   Medications prescribed:   Take any prescribed medications only as directed.   Home care instructions:  Follow any educational materials contained in this packet  Follow-up instructions: Return to the Emergency Department in 48 hours for a recheck if your symptoms are not significantly improved.  Please follow-up with your primary care provider in the next 1 week for further evaluation of your symptoms.   Return instructions:  Return to the Emergency Department if you have: Fever Worsening symptoms Worsening pain Worsening swelling Redness of the skin that moves away from the affected area, especially if it streaks away from the affected area  Any other emergent concerns  Additional Information: If you have recurrent abscesses, try both the following. Use a Qtip to apply an over-the-counter antibiotic to the inside of your nostrils, twice a day for 5 days. Wash your body with over-the-counter Hibaclens once a day for one week and then once every two weeks. This can reduce the amount of bacterial on your skin that causes boils and lead to fewer boils. If you continue to have multiple or recurrent boils, you should see a dermatologist (skin doctor).   Your vital signs today were: BP 126/72 (BP Location: Left Arm)    Pulse 95    Temp 98.5 F (36.9 C) (Oral)    Resp (!) 22    Ht 5\' 7"  (1.702 m)    Wt 81.6 kg    SpO2 99%    BMI 28.19 kg/m  If your blood pressure (BP) was elevated above 135/85 this visit, please have this repeated by your doctor within one month. --------------

## 2016-11-06 NOTE — ED Provider Notes (Signed)
[facility]-EMERGENCY DEPT Provider Note   CSN: [id number] Arrival date & time: 11/06/16  1610   By signing my name below, I, Clarisse GougeXavier Herndon, attest that this documentation has been prepared under the direction and in the presence of Audry Piliyler Jalaina Salyers, PA-C . Electronically Signed: Clarisse GougeXavier Herndon, Scribe. 11/06/16. 5:05 PM.   History   Chief Complaint Chief Complaint  Patient presents with  . Abscess   The history is provided by the patient and medical records. No language interpreter was used.    HPI Comments: [name] is a 31 y.o. female with Hx of anemia and abscesses to the groin who presents to the Emergency Department complaining of a worsening, raised bump to the R axilla x 3 days. Pt [facility] ED on 11/05/2016, where she was diagnosed with an abscess and prescribed bactrim. She states [facility] because it has grown since yesterday. Records indicate the pt describes her pain as 8/10, sharp, constat and throbbing, radiating to the right hand, worse with cold air exposure, letting the arm down and application of pressure. Pt notes associated intermittent tingling in her right fourth and fifth digits, L arm numbness, warmth to the area, redness to the area, hardening to the skin to the affected area, new deodorant and intermittent nausea. Per triage, pt has taken 2 doses of prescribed bactrim with no relief and she took a dose of penicillin on the day of she noticed the bump with no relief. Pt reportedly notes mild relief with epsom salt soaks, warm compresses, tylenol and ibuprofen. Pt denies fever, chills, N/V/D, loss of sensation, weakness and drainage.   Past Medical History:  Diagnosis Date  . Abnormal Pap smear   . Acute gallstone pancreatitis 08/30/2014  . Anemia 2007  . Anxiety   . Asthma   . Chlamydia infection    has no taken treatment as of 01/03/2013  . Depression   . Headache(784.0)    migraines  . HPV (human papilloma virus) anogenital infection   . Late  prenatal care    22wks  . Pancreatitis, acute 08/08/2014  . PIH (pregnancy induced hypertension)   . Pregnancy induced hypertension   . Recurrent UTI   . SVD (spontaneous vaginal delivery) 01/08/2013  . Twin pregnancy in third trimester 08/08/2014  . Vaginal Pap smear, abnormal     Patient Active Problem List   Diagnosis Date Noted  . Acute cholecystitis 12/13/2014  . SVD (spontaneous vaginal delivery) 09/18/2014  . Breech extraction, delivered 09/18/2014  . Acute gallstone pancreatitis 08/30/2014  . Twin pregnancy in third trimester 08/08/2014  . Pancreatitis, acute 08/08/2014  . Asthma 06/13/2011  . Depression 06/13/2011    Past Surgical History:  Procedure Laterality Date  . CHOLECYSTECTOMY  12/13/2014  . CHOLECYSTECTOMY N/A 12/13/2014   Procedure: LAPAROSCOPIC CHOLECYSTECTOMY WITH INTRAOPERATIVE CHOLANGIOGRAM;  Surgeon: Manus RuddMatthew Tsuei, MD;  Location: [facility] OR;  Service: General;  Laterality: N/A;  . LAPAROSCOPIC TUBAL LIGATION Bilateral 03/14/2015   Procedure: LAPAROSCOPIC TUBAL LIGATION;  Surgeon: Lavina Hammanodd Meisinger, MD;  Location: [facility];  Service: Gynecology;  Laterality: Bilateral;  . VAGINAL DELIVERY N/A 09/18/2014   Procedure: VAGINAL DELIVERY;  Surgeon: Lavina Hammanodd Meisinger, MD;  Location: [facility];  Service: Obstetrics;  Laterality: N/A;    OB History    Gravida Para Term Preterm AB Living   6 4 3 1 2 5    SAB TAB Ectopic Multiple Live Births   2 0 0 1 5       Home Medications  Prior to Admission medications   Medication Sig Start Date End Date Taking? Authorizing Provider  acetaminophen (TYLENOL) 500 MG tablet Take 1,000 mg by mouth every 6 (six) hours as needed for mild pain or headache.    Historical Provider, MD  albuterol (PROVENTIL HFA;VENTOLIN HFA) 108 (90 BASE) MCG/ACT inhaler Inhale 2 puffs into the lungs every 6 (six) hours as needed for wheezing or shortness of breath. Patient stated needs  new prescription    Historical Provider, MD  aspirin-acetaminophen-caffeine  (EXCEDRIN MIGRAINE) [phone] MG per tablet Take 2 tablets by mouth every 6 (six) hours as needed for headache.    Historical Provider, MD  busPIRone (BUSPAR) 15 MG tablet Take 15 mg by mouth 3 (three) times daily.    Historical Provider, MD  diphenhydrAMINE (BENADRYL) 25 MG tablet Take 1 tablet by mouth daily as needed.    Historical Provider, MD  doxycycline (VIBRAMYCIN) 100 MG capsule Take 1 capsule (100 mg total) by mouth 2 (two) times daily. One po bid x 7 days Patient not taking: Reported on 03/17/2015 03/09/15   Trixie Dredge, PA-C  ferrous sulfate 325 (65 FE) MG tablet Take 1 tablet (325 mg total) by mouth daily with breakfast. Patient taking differently: Take 325 mg by mouth 3 (three) times daily with meals.  08/10/14   Huel Cote, MD  ibuprofen (ADVIL,MOTRIN) 800 MG tablet Take 1 tablet (800 mg total) by mouth every 8 (eight) hours as needed for mild pain or moderate pain. 03/09/15   Trixie Dredge, PA-C  medroxyPROGESTERone (DEPO-PROVERA) 150 MG/ML injection Inject 150 mg into the muscle every 3 (three) months.    Historical Provider, MD  naproxen (NAPROSYN) 500 MG tablet Take 1 tablet (500 mg total) by mouth 2 (two) times daily. 03/30/15   Hanna Patel-Mills, PA-C  OXcarbazepine (TRILEPTAL) 150 MG tablet Take 150 mg by mouth 2 (two) times daily.    Historical Provider, MD  oxyCODONE-acetaminophen (PERCOCET/ROXICET) 5-325 MG per tablet Take 1 tablet by mouth every 4 (four) hours as needed for severe pain.    Historical Provider, MD  oxyCODONE-acetaminophen (PERCOCET/ROXICET) 5-325 MG per tablet Take 1 tablet by mouth every 6 (six) hours as needed for severe pain. 03/17/15   Marny Lowenstein, PA-C  pseudoephedrine (SUDAFED) 120 MG 12 hr tablet Take 120 mg by mouth daily as needed for congestion.     Historical Provider, MD  sertraline (ZOLOFT) 100 MG tablet Take 150 mg by mouth daily.     Historical Provider, MD  sulfamethoxazole-trimethoprim (BACTRIM DS,SEPTRA DS) 800-160 MG tablet Take 1 tablet by  mouth 2 (two) times daily. 11/05/16   Mercedes Street, PA-C    Family History Family History  Problem Relation Age of Onset  . Diabetes Mother   . Heart disease Maternal Grandmother     clots in heart  . Stroke Maternal Grandmother   . Heart disease Maternal Grandfather     pacemaker  . Miscarriages / Stillbirths Paternal Aunt   . Stroke Paternal Grandmother   . Anesthesia problems Neg Hx   . Hypotension Neg Hx   . Malignant hyperthermia Neg Hx   . Pseudochol deficiency Neg Hx     Social History Social History  Substance Use Topics  . Smoking status: Current Every Day Smoker    Packs/day: 0.25    Years: 5.00    Types: Cigarettes  . Smokeless tobacco: Never Used  . Alcohol use No     Allergies   Corn syrup; Tramadol; and Ketorolac   Review of  Systems Review of Systems  Constitutional: Negative for chills and fever.  Gastrointestinal: Negative for diarrhea, nausea and vomiting.  Skin: Negative for color change.       +raised bump -drainage +warmth  Neurological: Negative for weakness.     Physical Exam Updated Vital Signs BP 126/72 (BP Location: Left Arm)   Pulse 95   Temp 98.5 F (36.9 C) (Oral)   Resp (!) 22   Ht 5\' 7"  (1.702 m)   Wt 180 lb (81.6 kg)   SpO2 99%   BMI 28.19 kg/m   Physical Exam  Constitutional: She is oriented to person, place, and time. Vital signs are normal. She appears well-developed and well-nourished.  HENT:  Head: Normocephalic and atraumatic.  Right Ear: Hearing normal.  Left Ear: Hearing normal.  Eyes: Conjunctivae and EOM are normal. Pupils are equal, round, and reactive to light. Right eye exhibits no discharge. Left eye exhibits no discharge. No scleral icterus.  Neck: Normal range of motion. No JVD present. No tracheal deviation present.  Cardiovascular: Normal rate and regular rhythm.   Pulmonary/Chest: Effort normal. No stridor.  Neurological: She is alert and oriented to person, place, and time. Coordination normal.   Skin: Skin is warm and dry.  3 cm fluctuant mass to the R axilla; mild erythema; no purulence; TTP  Psychiatric: She has a normal mood and affect. Her speech is normal and behavior is normal. Judgment and thought content normal.  Nursing note and vitals reviewed.  ED Treatments / Results  DIAGNOSTIC STUDIES: Oxygen Saturation is 99% on RA, normal by my interpretation.    COORDINATION OF CARE: 5:05 PM Discussed treatment plan with pt at bedside and pt agreed to plan. Pt prepared for [place] scan of R axilla.  5:11 PM US performed. Pt prepared for I&D of abscess on the R axilla.   Labs (all labs ordered are listed, but only abnormal results are displayed) Labs Reviewed - No data to display  EKG  EKG Interpretation None       Radiology No results found.  Procedures .Marland KitchenIncision and Drainage Date/Time: 11/06/2016 5:51 PM Performed by: Audry Pili Authorized by: Audry Pili   Consent:    Consent obtained:  Verbal   Consent given by:  Patient   Risks discussed:  Bleeding and infection Location:    Type:  Abscess   Size:  3 cm   Location:  Upper extremity   Upper extremity location:  Arm   Arm location:  R upper arm Pre-procedure details:    Skin preparation:  Betadine Anesthesia (see MAR for exact dosages):    Anesthesia method:  Topical application   Topical anesthetic:  Lidocaine gel Procedure type:    Complexity:  Simple Procedure details:    Needle aspiration: no     Incision types:  Single straight   Incision depth:  Dermal   Scalpel blade:  15   Wound management:  Irrigated with saline   Drainage:  Purulent   Drainage amount:  Copious   Wound treatment:  Wound left open   Packing materials:  None Post-procedure details:    Patient tolerance of procedure:  Tolerated well, no immediate complications      (including critical care time)  Medications Ordered in ED Medications - No data to display   Initial Impression / Assessment and Plan / ED Course  I  have reviewed the triage vital signs and the nursing notes.  Pertinent labs & imaging results that were available during my care of  the patient were reviewed by me and considered in my medical decision making (see chart for details).  Final Clinical Impressions(s) / ED Diagnoses     {I have reviewed the relevant previous healthcare records.  {I obtained HPI from historian.   ED Course:  Assessment: Pt is a 31 y.o. female who presents to the Emergency Department with skin abscess amenable to incision and drainage. No signs of cellulitis surrounding skin. Continue Rx Abx (bactrim) that was given previously, Will d/c to home. Strict return precautions given. Follow up with PCP in 2-3 days.   Disposition/Plan:  DC Home Additional Verbal discharge instructions given and discussed with patient.  Pt Instructed to f/u with PCP in the next week for evaluation and treatment of symptoms. Return precautions given Pt acknowledges and agrees with plan  Supervising Physician Donnetta Hutching, MD  Final diagnoses:  Abscess of axilla, right    New Prescriptions New Prescriptions   No medications on file   I personally performed the services described in this documentation, which was scribed in my presence. The recorded information has been reviewed and is accurate.      Audry Pili, PA-C 11/06/16 1825    Donnetta Hutching, MD 11/07/16 [phone]

## 2016-11-06 NOTE — ED Triage Notes (Signed)
Pt. Stated, I have a bump in my arm pit on the rt. I went to [facility] yesterday and they gave me an antibiotic but its worse today.I've take 2 does of the antibiotic.  I also took some Penicillin on Wed. When it came up.

## 2016-11-06 NOTE — ED Notes (Signed)
Pt ready to go but requesting a pharmacy tech to check her meds

## 2019-02-04 ENCOUNTER — Emergency Department (HOSPITAL_COMMUNITY)
Admission: EM | Admit: 2019-02-04 | Discharge: 2019-02-04 | Disposition: A | Discharge status: Home / Self Care | Payer: Medicaid Other | Attending: Emergency Medicine | Admitting: Emergency Medicine

## 2019-02-04 ENCOUNTER — Other Ambulatory Visit: Payer: Self-pay

## 2019-02-04 ENCOUNTER — Encounter (HOSPITAL_COMMUNITY): Payer: Self-pay | Admitting: Emergency Medicine

## 2019-02-04 ENCOUNTER — Emergency Department (EMERGENCY_DEPARTMENT_HOSPITAL)
Admission: EM | Admit: 2019-02-04 | Discharge: 2019-02-05 | Disposition: A | Discharge status: Home / Self Care | Payer: Medicaid Other | Source: Home / Self Care | Attending: Emergency Medicine | Admitting: Emergency Medicine

## 2019-02-04 DIAGNOSIS — G8929 Other chronic pain: Secondary | ICD-10-CM | POA: Diagnosis not present

## 2019-02-04 DIAGNOSIS — W108XXA Fall (on) (from) other stairs and steps, initial encounter: Secondary | ICD-10-CM | POA: Insufficient documentation

## 2019-02-04 DIAGNOSIS — J45909 Unspecified asthma, uncomplicated: Secondary | ICD-10-CM | POA: Insufficient documentation

## 2019-02-04 DIAGNOSIS — F39 Unspecified mood [affective] disorder: Secondary | ICD-10-CM

## 2019-02-04 DIAGNOSIS — M545 Low back pain, unspecified: Secondary | ICD-10-CM

## 2019-02-04 DIAGNOSIS — Z9049 Acquired absence of other specified parts of digestive tract: Secondary | ICD-10-CM | POA: Diagnosis not present

## 2019-02-04 DIAGNOSIS — Y9301 Activity, walking, marching and hiking: Secondary | ICD-10-CM | POA: Insufficient documentation

## 2019-02-04 DIAGNOSIS — F1110 Opioid abuse, uncomplicated: Secondary | ICD-10-CM | POA: Diagnosis present

## 2019-02-04 DIAGNOSIS — Z79899 Other long term (current) drug therapy: Secondary | ICD-10-CM | POA: Diagnosis not present

## 2019-02-04 DIAGNOSIS — F17210 Nicotine dependence, cigarettes, uncomplicated: Secondary | ICD-10-CM | POA: Insufficient documentation

## 2019-02-04 DIAGNOSIS — F1990 Other psychoactive substance use, unspecified, uncomplicated: Secondary | ICD-10-CM

## 2019-02-04 DIAGNOSIS — Y998 Other external cause status: Secondary | ICD-10-CM | POA: Insufficient documentation

## 2019-02-04 DIAGNOSIS — F1994 Other psychoactive substance use, unspecified with psychoactive substance-induced mood disorder: Secondary | ICD-10-CM | POA: Insufficient documentation

## 2019-02-04 DIAGNOSIS — F17228 Nicotine dependence, chewing tobacco, with other nicotine-induced disorders: Secondary | ICD-10-CM | POA: Insufficient documentation

## 2019-02-04 DIAGNOSIS — Y92018 Other place in single-family (private) house as the place of occurrence of the external cause: Secondary | ICD-10-CM | POA: Insufficient documentation

## 2019-02-04 DIAGNOSIS — F11288 Opioid dependence with other opioid-induced disorder: Secondary | ICD-10-CM | POA: Diagnosis not present

## 2019-02-04 NOTE — ED Notes (Signed)
Patient verbalizes understanding of discharge instructions. Opportunity for questioning and answers were provided. Armband removed by staff, pt discharged from ED.  

## 2019-02-04 NOTE — ED Triage Notes (Signed)
BIB EMS from home. Pt just DC from ED 3hrs for same. Pt presents stating she has a "drug problem" and feels like no one cares about her at home. Continues to have fights with family. Pt denies SI/HI.
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
## 2019-02-04 NOTE — ED Triage Notes (Signed)
Pt. Was drop off by grandfather and says I need a psych evaluation and I take all kinds of drugs.  Pt. Stated, I take my pain med for my bad knee and I take Xanax. So I do not take drugs like he sai.

## 2019-02-04 NOTE — ED Notes (Signed)
Pt. Very upset at triage she said her grandfather made her come.

## 2019-02-05 ENCOUNTER — Emergency Department (HOSPITAL_COMMUNITY): Payer: Medicaid Other

## 2019-02-05 DIAGNOSIS — R87619 Unspecified abnormal cytological findings in specimens from cervix uteri: Secondary | ICD-10-CM | POA: Insufficient documentation

## 2019-02-05 DIAGNOSIS — F11288 Opioid dependence with other opioid-induced disorder: Secondary | ICD-10-CM

## 2019-02-05 DIAGNOSIS — G8929 Other chronic pain: Secondary | ICD-10-CM

## 2019-02-05 LAB — I-STAT BETA HCG BLOOD, ED (MC, WL, AP ONLY): I-stat hCG, quantitative: <5 m[IU]/mL (ref <5)

## 2019-02-05 LAB — CBC
HCT: 38.4 % (ref 36.0–46.0)
Hemoglobin: 13 g/dL (ref 12.0–15.0)
MCH: 30.2 pg (ref 26.0–34.0)
MCHC: 33.9 g/dL (ref 30.0–36.0)
MCV: 89.1 fL (ref 80.0–100.0)
Platelets: 310 10*3/uL (ref 150–400)
RBC: 4.31 MIL/uL (ref 3.87–5.11)
RDW: 11.9 % (ref 11.5–15.5)
WBC: 7.4 10*3/uL (ref 4.0–10.5)
nRBC: 0 % (ref 0.0–0.2)

## 2019-02-05 LAB — COMPREHENSIVE METABOLIC PANEL
ALT: 22 U/L (ref 0–44)
AST: 29 U/L (ref 15–41)
Albumin: 4.1 g/dL (ref 3.5–5.0)
Alkaline Phosphatase: 57 U/L (ref 38–126)
Anion gap: 9 (ref 5–15)
BUN: 14 mg/dL (ref 6–20)
CO2: 26 mmol/L (ref 22–32)
Calcium: 9.5 mg/dL (ref 8.9–10.3)
Chloride: 103 mmol/L (ref 98–111)
Creatinine, Ser: 0.84 mg/dL (ref 0.44–1.00)
GFR calc Af Amer: >60 mL/min (ref >60)
GFR calc non Af Amer: >60 mL/min (ref >60)
Glucose, Bld: 104 mg/dL — ABNORMAL HIGH (ref 70–99)
Potassium: 3.6 mmol/L (ref 3.5–5.1)
Sodium: 138 mmol/L (ref 135–145)
Total Bilirubin: 0.6 mg/dL (ref 0.3–1.2)
Total Protein: 6.7 g/dL (ref 6.5–8.1)

## 2019-02-05 LAB — ETHANOL: Alcohol, Ethyl (B): <10 mg/dL (ref <10)

## 2019-02-05 LAB — RAPID URINE DRUG SCREEN, HOSP PERFORMED
Amphetamines: NOT DETECTED
Barbiturates: NOT DETECTED
Benzodiazepines: POSITIVE — AB
Cocaine: NOT DETECTED
Opiates: POSITIVE — AB
Tetrahydrocannabinol: NOT DETECTED
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
## 2019-02-05 NOTE — [facility] Counselor (Signed)
Pt reported, no family, friend supports clinician could contact to obtain collateral information.   Vertell Novak, [place], [facility], [facility] Triage Specialist [phone]

## 2019-02-05 NOTE — ED Notes (Signed)
TTS complete 
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
## 2019-02-05 NOTE — ED Notes (Signed)
Resources given to patient to follow up with.

## 2019-02-05 NOTE — [facility] Counselor (Addendum)
Clinician faxed substance use resources to to [place], Therapist, sports.   Darlyne Russian, PA assessed the pt via tele-assessment.    Vertell Novak, [place], [facility], [facility] Triage Specialist [phone]

## 2019-02-06 ENCOUNTER — Encounter (HOSPITAL_COMMUNITY): Payer: Self-pay | Admitting: Emergency Medicine

## 2019-02-06 ENCOUNTER — Emergency Department (HOSPITAL_COMMUNITY)
Admission: EM | Admit: 2019-02-06 | Discharge: 2019-02-07 | Disposition: A | Discharge status: Home / Self Care | Payer: Medicaid Other | Attending: Emergency Medicine | Admitting: Emergency Medicine

## 2019-02-06 ENCOUNTER — Other Ambulatory Visit: Payer: Self-pay

## 2019-02-06 DIAGNOSIS — F1910 Other psychoactive substance abuse, uncomplicated: Secondary | ICD-10-CM | POA: Diagnosis not present

## 2019-02-06 DIAGNOSIS — F32A Depression, unspecified: Secondary | ICD-10-CM

## 2019-02-06 DIAGNOSIS — Z79899 Other long term (current) drug therapy: Secondary | ICD-10-CM | POA: Diagnosis not present

## 2019-02-06 DIAGNOSIS — F329 Major depressive disorder, single episode, unspecified: Secondary | ICD-10-CM

## 2019-02-06 DIAGNOSIS — F17210 Nicotine dependence, cigarettes, uncomplicated: Secondary | ICD-10-CM | POA: Diagnosis not present

## 2019-02-06 DIAGNOSIS — J45909 Unspecified asthma, uncomplicated: Secondary | ICD-10-CM | POA: Insufficient documentation

## 2019-02-06 LAB — I-STAT BETA HCG BLOOD, ED (MC, WL, AP ONLY): I-stat hCG, quantitative: <5 m[IU]/mL (ref <5)

## 2019-02-06 LAB — RAPID URINE DRUG SCREEN, HOSP PERFORMED
Amphetamines: NOT DETECTED
Barbiturates: NOT DETECTED
Benzodiazepines: POSITIVE — AB
Cocaine: NOT DETECTED
Opiates: POSITIVE — AB
Tetrahydrocannabinol: POSITIVE — AB

## 2019-02-06 LAB — COMPREHENSIVE METABOLIC PANEL
ALT: 24 U/L (ref 0–44)
AST: 29 U/L (ref 15–41)
Albumin: 4.3 g/dL (ref 3.5–5.0)
Alkaline Phosphatase: 57 U/L (ref 38–126)
Anion gap: 9 (ref 5–15)
BUN: 11 mg/dL (ref 6–20)
CO2: 22 mmol/L (ref 22–32)
Calcium: 9.5 mg/dL (ref 8.9–10.3)
Chloride: 105 mmol/L (ref 98–111)
Creatinine, Ser: 1.18 mg/dL — ABNORMAL HIGH (ref 0.44–1.00)
GFR calc Af Amer: >60 mL/min (ref >60)
GFR calc non Af Amer: >60 mL/min (ref >60)
Glucose, Bld: 121 mg/dL — ABNORMAL HIGH (ref 70–99)
Potassium: 3.3 mmol/L — ABNORMAL LOW (ref 3.5–5.1)
Sodium: 136 mmol/L (ref 135–145)
Total Bilirubin: 0.7 mg/dL (ref 0.3–1.2)
Total Protein: 6.8 g/dL (ref 6.5–8.1)

## 2019-02-06 LAB — ETHANOL: Alcohol, Ethyl (B): <10 mg/dL (ref <10)

## 2019-02-06 LAB — CBC
HCT: 37.6 % (ref 36.0–46.0)
Hemoglobin: 13 g/dL (ref 12.0–15.0)
MCH: 30.4 pg (ref 26.0–34.0)
MCHC: 34.6 g/dL (ref 30.0–36.0)
MCV: 88.1 fL (ref 80.0–100.0)
Platelets: 270 10*3/uL (ref 150–400)
RBC: 4.27 MIL/uL (ref 3.87–5.11)
RDW: 11.9 % (ref 11.5–15.5)
WBC: 8 10*3/uL (ref 4.0–10.5)
nRBC: 0 % (ref 0.0–0.2)

## 2019-02-06 LAB — ACETAMINOPHEN LEVEL: Acetaminophen (Tylenol), Serum: 14 ug/mL (ref 10–30)

## 2019-02-06 MED ORDER — SODIUM CHLORIDE 0.9 % IV BOLUS
1000.0000 mL | Freq: Once | INTRAVENOUS | Status: AC
  Administered 2019-02-06: 1000 mL via INTRAVENOUS

## 2019-02-06 MED ORDER — SODIUM CHLORIDE 0.9 % IV BOLUS
1000.0000 mL | Freq: Once | INTRAVENOUS | Status: AC
  Administered 2019-02-07: 01:00:00 1000 mL via INTRAVENOUS

## 2019-02-06 NOTE — ED Triage Notes (Signed)
Arrives from home with c/o of wanting to get out drugs and alcohol for her kids- pt states her mother passed away a few months ago and has been using fentanyl percocet and etoh.  Pt states she took some percocet and a shot of liquor PTA. Pt denies any SI or HI.

## 2019-02-06 NOTE — ED Provider Notes (Signed)
[facility] Provider Note   CSN: [id number] Arrival date & time: 02/06/19  1724    History   Chief Complaint Chief Complaint  Patient presents with  . Drug / Alcohol Assessment    HPI [name] is a 33 y.o. female.     Patient is a 33 year old female who presents with depression and substance abuse.  She states that her mom died 5 years ago and every year it seems to get harder around the anniversary of her death.  She has worsening depression and anxiety.  She denies any feelings of self-harm/suicidal ideations.  She states that over the last week she has been using a lot of Xanax with Roxie's and Percocets.  She also has been drinking alcohol.     Past Medical History:  Diagnosis Date  . Abnormal Pap smear   . Acute gallstone pancreatitis 08/30/2014  . Anemia 2007  . Anxiety   . Asthma   . Chlamydia infection    has no taken treatment as of 01/03/2013  . Depression   . Headache(784.0)    migraines  . HPV (human papilloma virus) anogenital infection   . Late prenatal care    22wks  . Pancreatitis, acute 08/08/2014  . PIH (pregnancy induced hypertension)   . Pregnancy induced hypertension   . Recurrent UTI   . SVD (spontaneous vaginal delivery) 01/08/2013  . Twin pregnancy in third trimester 08/08/2014  . Vaginal Pap smear, abnormal     Patient Active Problem List   Diagnosis Date Noted  . Abnormal cervical Papanicolaou smear 02/05/2019  . Nasal septum perforation 03/19/2015  . Acute cholecystitis 12/13/2014  . SVD (spontaneous vaginal delivery) 09/18/2014  . Breech extraction, delivered 09/18/2014  . Acute gallstone pancreatitis 08/30/2014  . Twin pregnancy in third trimester 08/08/2014  . Pancreatitis, acute 08/08/2014  . Asthma 06/13/2011  . Depression 06/13/2011    Past Surgical History:  Procedure Laterality Date  . CHOLECYSTECTOMY  12/13/2014  . CHOLECYSTECTOMY N/A 12/13/2014   Procedure: LAPAROSCOPIC  CHOLECYSTECTOMY WITH INTRAOPERATIVE CHOLANGIOGRAM;  Surgeon: Manus RuddMatthew Tsuei, MD;  Location: [facility] OR;  Service: General;  Laterality: N/A;  . LAPAROSCOPIC TUBAL LIGATION Bilateral 03/14/2015   Procedure: LAPAROSCOPIC TUBAL LIGATION;  Surgeon: Lavina Hammanodd Meisinger, MD;  Location: [facility] ORS;  Service: Gynecology;  Laterality: Bilateral;  . VAGINAL DELIVERY N/A 09/18/2014   Procedure: VAGINAL DELIVERY;  Surgeon: Lavina Hammanodd Meisinger, MD;  Location: [facility] ORS;  Service: Obstetrics;  Laterality: N/A;     OB History    Gravida  6   Para  4   Term  3   Preterm  1   AB  2   Living  5     SAB  2   TAB  0   Ectopic  0   Multiple  1   Live Births  5            Home Medications    Prior to Admission medications   Medication Sig Start Date End Date Taking? Authorizing Provider  acetaminophen (TYLENOL) 500 MG tablet Take 1,000 mg by mouth every 6 (six) hours as needed for mild pain or headache.    [provider]  albuterol (PROVENTIL HFA;VENTOLIN HFA) 108 (90 BASE) MCG/ACT inhaler Inhale 2 puffs into the lungs every 6 (six) hours as needed (excercise induced asthma).     [provider]  aspirin-acetaminophen-caffeine (EXCEDRIN MIGRAINE) [phone] [id number] MG per tablet Take 2 tablets by mouth every 6 (six) hours as needed for migraine.  [provider]  diphenhydrAMINE (BENADRYL) 25 MG tablet Take 25-50 mg by mouth 2 (two) times daily as needed (allergic reaction/ seasonal allergies).     [provider]  ferrous sulfate 325 (65 FE) MG tablet Take 1 tablet (325 mg total) by mouth daily with breakfast. Patient taking differently: Take 325 mg by mouth See admin instructions. Take 1 tablet (325 mg) by mouth one to three times daily with meals 08/10/14   Huel Coteichardson, Kathy, MD  ibuprofen (ADVIL,MOTRIN) 200 MG tablet Take 400 mg by mouth every 8 (eight) hours as needed for fever or headache (pain).    [provider]  ibuprofen (ADVIL,MOTRIN) 800 MG tablet Take 1 tablet  (800 mg total) by mouth every 8 (eight) hours as needed for mild pain or moderate pain. Patient not taking: Reported on 11/06/2016 03/09/15   Trixie DredgeWest, Emily, PA-C  naproxen (NAPROSYN) 500 MG tablet Take 1 tablet (500 mg total) by mouth 2 (two) times daily. Patient not taking: Reported on 11/06/2016 03/30/15   Patel-Mills, Lorelle FormosaHanna, PA-C  oxyCODONE-acetaminophen (PERCOCET/ROXICET) 5-325 MG per tablet Take 1 tablet by mouth every 6 (six) hours as needed for severe pain. Patient not taking: Reported on 11/06/2016 03/17/15   Marny LowensteinWenzel, Julie N, PA-C  sulfamethoxazole-trimethoprim (BACTRIM DS,SEPTRA DS) 800-160 MG tablet Take 1 tablet by mouth 2 (two) times daily. Patient taking differently: Take 1 tablet by mouth 2 (two) times daily. 7 day course started 11/06/16 11/05/16   Street, [place] CreekMercedes, PA-C    Family History Family History  Problem Relation Age of Onset  . Diabetes Mother   . Heart disease Maternal Grandmother        clots in heart  . Stroke Maternal Grandmother   . Heart disease Maternal Grandfather        pacemaker  . Miscarriages / Stillbirths Paternal Aunt   . Stroke Paternal Grandmother   . Anesthesia problems Neg Hx   . Hypotension Neg Hx   . Malignant hyperthermia Neg Hx   . Pseudochol deficiency Neg Hx     Social History Social History   Tobacco Use  . Smoking status: Current Every Day Smoker    Packs/day: 0.25    Years: 5.00    Pack years: 1.25    Types: Cigarettes  . Smokeless tobacco: Current User  Substance Use Topics  . Alcohol use: No  . Drug use: Yes     Allergies   Corn syrup, Tramadol, Hydrocodone-acetaminophen, and Ketorolac   Review of Systems Review of Systems  Constitutional: Negative for chills, diaphoresis, fatigue and fever.  HENT: Negative for congestion, rhinorrhea and sneezing.   Eyes: Negative.   Respiratory: Negative for cough, chest tightness and shortness of breath.   Cardiovascular: Negative for chest pain and leg swelling.  Gastrointestinal:  Negative for abdominal pain, blood in stool, diarrhea, nausea and vomiting.  Genitourinary: Negative for difficulty urinating, flank pain, frequency and hematuria.  Musculoskeletal: Positive for arthralgias. Negative for back pain.  Skin: Negative for rash.  Neurological: Negative for dizziness, speech difficulty, weakness, numbness and headaches.  Psychiatric/Behavioral: Positive for dysphoric mood. Negative for suicidal ideas. The patient is nervous/anxious.      Physical Exam Updated Vital Signs BP 92/60   Pulse 74   Temp 98 F (36.7 C) (Oral)   Resp 10   LMP 01/13/2019   SpO2 100%   Physical Exam Constitutional:      Appearance: She is well-developed.  HENT:     Head: Normocephalic and atraumatic.  Eyes:  Pupils: Pupils are equal, round, and reactive to light.  Neck:     Musculoskeletal: Normal range of motion and neck supple.  Cardiovascular:     Rate and Rhythm: Normal rate and regular rhythm.     Heart sounds: Normal heart sounds.  Pulmonary:     Effort: Pulmonary effort is normal. No respiratory distress.     Breath sounds: Normal breath sounds. No wheezing or rales.  Chest:     Chest wall: No tenderness.  Abdominal:     General: Bowel sounds are normal.     Palpations: Abdomen is soft.     Tenderness: There is no abdominal tenderness. There is no guarding or rebound.  Musculoskeletal: Normal range of motion.  Lymphadenopathy:     Cervical: No cervical adenopathy.  Skin:    General: Skin is warm and dry.     Findings: No rash.  Neurological:     Mental Status: She is alert and oriented to person, place, and time.     Comments: Patient is sleepy but will wake up and answer questions appropriately, no focal neurologic deficits      ED Treatments / Results  Labs (all labs ordered are listed, but only abnormal results are displayed) Labs Reviewed  COMPREHENSIVE METABOLIC PANEL - Abnormal; Notable for the following components:      Result Value    Potassium 3.3 (*)    Glucose, Bld 121 (*)    Creatinine, Ser 1.18 (*)    All other components within normal limits  ETHANOL  CBC  ACETAMINOPHEN LEVEL  RAPID URINE DRUG SCREEN, HOSP PERFORMED  I-STAT BETA HCG BLOOD, ED ([facility], [facility], [facility] ONLY)    EKG None  Radiology Dg Lumbar Spine Complete  Result Date: 02/05/2019 CLINICAL DATA:  Lumbar back pain after fall. EXAM: LUMBAR SPINE - COMPLETE 4+ VIEW COMPARISON:  None. FINDINGS: The alignment is maintained. Vertebral body heights are normal. There is no listhesis. The posterior elements are intact. Disc spaces are preserved. No fracture. Sacroiliac joints are symmetric and normal. IMPRESSION: Negative radiographs of the lumbar spine. Electronically Signed   By: Keith Rake M.D.   On: 02/05/2019 03:18   Dg Hip Unilat W Or W/o Pelvis Min 4 Views Right  Result Date: 02/05/2019 CLINICAL DATA:  Right hip pain after fall. EXAM: DG HIP (WITH OR WITHOUT PELVIS) 4+V RIGHT COMPARISON:  None. FINDINGS: The cortical margins of the bony pelvis and right hip are intact. No fracture. Pubic symphysis and sacroiliac joints are congruent. Both femoral heads are well-seated in the respective acetabula. IMPRESSION: Negative radiographs of the pelvis and right hip. Electronically Signed   By: Keith Rake M.D.   On: 02/05/2019 03:17    Procedures Procedures (including critical care time)  Medications Ordered in ED Medications  sodium chloride 0.9 % bolus 1,000 mL (0 mLs Intravenous Stopped 02/06/19 2215)     Initial Impression / Assessment and Plan / ED Course  I have reviewed the triage vital signs and the nursing notes.  Pertinent labs & imaging results that were available during my care of the patient were reviewed by me and considered in my medical decision making (see chart for details).        Patient is a 33 year old female who presents with substance abuse/ingestion.  She is quite sleepy on exam but is maintaining her airway.  She denies any  suicidal ideations.  Her EtOH level is negative.  Her acetaminophen level is negative.  She will need to sober up  and have a reassessment.  Dr. Lorenso CourierMesser to follow  Final Clinical Impressions(s) / ED Diagnoses   Final diagnoses:  Polysubstance abuse (HCC)  Depression, unspecified depression type    ED Discharge Orders    None       Rolan BuccoBelfi, Emmaus Brandi, MD 02/06/19 2324

## 2019-02-06 NOTE — ED Notes (Signed)
[phone] Crystal sister

## 2019-02-06 NOTE — ED Provider Notes (Signed)
11:20 PM Assumed care from Dr. Tamera Punt, please see their note for full history, physical and decision making until this point. In brief this is a 33 y.o. year old female who presented to the ED tonight with Drug / Alcohol Assessment     Clinically intoxicated, here for 3rd day in a row for unclear reasons. Was here for intoxication previously and presumably given outpatient resources. At this time, difficult to obtain history from patient 2/2 intoxication. No significant corroborating history from sister, friend or grandfather. Plan to let her metabolize to clinical sobriety and reassess for likely discharge.   On reassessment after sobering up, she had pain in lateral foot apparently started after a fall. Plan for xr. Care transferred pending same, anticipate discharge.   Labs, studies and imaging reviewed by myself and considered in medical decision making if ordered. Imaging interpreted by radiology.  Labs Reviewed  COMPREHENSIVE METABOLIC PANEL - Abnormal; Notable for the following components:      Result Value   Potassium 3.3 (*)    Glucose, Bld 121 (*)    Creatinine, Ser 1.18 (*)    All other components within normal limits  ETHANOL  CBC  ACETAMINOPHEN LEVEL  RAPID URINE DRUG SCREEN, HOSP PERFORMED  I-STAT BETA HCG BLOOD, ED ([facility], [facility], [facility] ONLY)    No orders to display    No follow-ups on file.    Ciin Brazzel, Corene Cornea, MD 02/08/19 [phone]

## 2019-02-07 ENCOUNTER — Emergency Department (HOSPITAL_COMMUNITY): Payer: Medicaid Other

## 2019-02-07 NOTE — ED Notes (Signed)
Pt is alert and oriented x4. Pt given discharge instruction, outpatient resources and follow up information. Pt given the opportunity to ask questions. Pt verbalized understanding.

## 2019-02-07 NOTE — ED Notes (Signed)
Pt called out and asked "for a sprite, a second applesauce and to cut the lights out so I can watch tv". Brought pt sprite, applesauce and turned the lights out. When exiting room pt asked for "a slushee". Informed pt we do not have slushee. Pt advised me "we do have slushees".

## 2019-02-07 NOTE — ED Notes (Signed)
Pt calling grandparents to come pick her up.

## 2019-02-07 NOTE — ED Notes (Addendum)
Attempted to contact pt grandparents to update.. No answer

## 2019-02-07 NOTE — Discharge Instructions (Addendum)
You have a serious problem with substance abuse. If you want a better quality of life for yourself and your children then you need to make getting sober a top priority.

## 2019-02-07 NOTE — ED Notes (Signed)
Pt now up and moving around answering all questions correctly. Alert and oriented x4.

## 2019-06-29 ENCOUNTER — Emergency Department (HOSPITAL_COMMUNITY): Payer: No Typology Code available for payment source

## 2019-06-29 ENCOUNTER — Other Ambulatory Visit: Payer: Self-pay

## 2019-06-29 ENCOUNTER — Encounter (HOSPITAL_COMMUNITY): Payer: Self-pay | Admitting: Emergency Medicine

## 2019-06-29 ENCOUNTER — Emergency Department (HOSPITAL_COMMUNITY)
Admission: EM | Admit: 2019-06-29 | Discharge: 2019-06-29 | Disposition: A | Discharge status: Home / Self Care | Payer: No Typology Code available for payment source | Attending: Emergency Medicine | Admitting: Emergency Medicine

## 2019-06-29 DIAGNOSIS — Y999 Unspecified external cause status: Secondary | ICD-10-CM | POA: Insufficient documentation

## 2019-06-29 DIAGNOSIS — M25559 Pain in unspecified hip: Secondary | ICD-10-CM | POA: Diagnosis present

## 2019-06-29 DIAGNOSIS — R110 Nausea: Secondary | ICD-10-CM | POA: Diagnosis not present

## 2019-06-29 DIAGNOSIS — Z23 Encounter for immunization: Secondary | ICD-10-CM | POA: Insufficient documentation

## 2019-06-29 DIAGNOSIS — Y9301 Activity, walking, marching and hiking: Secondary | ICD-10-CM | POA: Diagnosis not present

## 2019-06-29 DIAGNOSIS — Y929 Unspecified place or not applicable: Secondary | ICD-10-CM | POA: Insufficient documentation

## 2019-06-29 DIAGNOSIS — F419 Anxiety disorder, unspecified: Secondary | ICD-10-CM | POA: Diagnosis not present

## 2019-06-29 DIAGNOSIS — S3210XA Unspecified fracture of sacrum, initial encounter for closed fracture: Secondary | ICD-10-CM

## 2019-06-29 DIAGNOSIS — T1490XA Injury, unspecified, initial encounter: Secondary | ICD-10-CM

## 2019-06-29 DIAGNOSIS — S299XXA Unspecified injury of thorax, initial encounter: Secondary | ICD-10-CM | POA: Insufficient documentation

## 2019-06-29 LAB — CBC
HCT: 36.7 % (ref 36.0–46.0)
Hemoglobin: 12.3 g/dL (ref 12.0–15.0)
MCH: 30.4 pg (ref 26.0–34.0)
MCHC: 33.5 g/dL (ref 30.0–36.0)
MCV: 90.8 fL (ref 80.0–100.0)
Platelets: 375 10*3/uL (ref 150–400)
RBC: 4.04 MIL/uL (ref 3.87–5.11)
RDW: 12.3 % (ref 11.5–15.5)
WBC: 10.9 10*3/uL — ABNORMAL HIGH (ref 4.0–10.5)
nRBC: 0 % (ref 0.0–0.2)

## 2019-06-29 LAB — COMPREHENSIVE METABOLIC PANEL
ALT: 15 U/L (ref 0–44)
AST: 21 U/L (ref 15–41)
Albumin: 3.9 g/dL (ref 3.5–5.0)
Alkaline Phosphatase: 57 U/L (ref 38–126)
Anion gap: 11 (ref 5–15)
BUN: 13 mg/dL (ref 6–20)
CO2: 19 mmol/L — ABNORMAL LOW (ref 22–32)
Calcium: 8.6 mg/dL — ABNORMAL LOW (ref 8.9–10.3)
Chloride: 104 mmol/L (ref 98–111)
Creatinine, Ser: 0.79 mg/dL (ref 0.44–1.00)
GFR calc Af Amer: >60 mL/min (ref >60)
GFR calc non Af Amer: >60 mL/min (ref >60)
Glucose, Bld: 83 mg/dL (ref 70–99)
Potassium: 3.5 mmol/L (ref 3.5–5.1)
Sodium: 134 mmol/L — ABNORMAL LOW (ref 135–145)
Total Bilirubin: 0.6 mg/dL (ref 0.3–1.2)
Total Protein: 6.7 g/dL (ref 6.5–8.1)

## 2019-06-29 LAB — I-STAT CHEM 8, ED
BUN: 17 mg/dL (ref 6–20)
Calcium, Ion: 1.03 mmol/L — ABNORMAL LOW (ref 1.15–1.40)
Chloride: 108 mmol/L (ref 98–111)
Creatinine, Ser: 0.7 mg/dL (ref 0.44–1.00)
Glucose, Bld: 77 mg/dL (ref 70–99)
HCT: 37 % (ref 36.0–46.0)
Hemoglobin: 12.6 g/dL (ref 12.0–15.0)
Potassium: 4 mmol/L (ref 3.5–5.1)
Sodium: 139 mmol/L (ref 135–145)
TCO2: 22 mmol/L (ref 22–32)

## 2019-06-29 LAB — CDS SEROLOGY

## 2019-06-29 LAB — ETHANOL: Alcohol, Ethyl (B): <10 mg/dL (ref <10)

## 2019-06-29 LAB — I-STAT BETA HCG BLOOD, ED (MC, WL, AP ONLY): I-stat hCG, quantitative: <5 m[IU]/mL (ref <5)

## 2019-06-29 LAB — SAMPLE TO BLOOD BANK

## 2019-06-29 LAB — LACTIC ACID, PLASMA: Lactic Acid, Venous: 1.7 mmol/L (ref 0.5–1.9)

## 2019-06-29 LAB — PROTIME-INR
INR: 1.1 (ref 0.8–1.2)
Prothrombin Time: 14 seconds (ref 11.4–15.2)

## 2019-06-29 MED ORDER — TETANUS-DIPHTH-ACELL PERTUSSIS 5-2.5-18.5 LF-MCG/0.5 IM SUSP
0.5000 mL | Freq: Once | INTRAMUSCULAR | Status: AC
  Administered 2019-06-29: 0.5 mL via INTRAMUSCULAR
  Filled 2019-06-29: qty 0.5

## 2019-06-29 MED ORDER — OXYCODONE-ACETAMINOPHEN 5-325 MG PO TABS: 1.0000 | ORAL_TABLET | Freq: Four times a day (QID) | ORAL | 0 refills | Status: DC | PRN

## 2019-06-29 MED ORDER — FENTANYL CITRATE (PF) 100 MCG/2ML IJ SOLN: 50.0000 ug | Freq: Once | INTRAMUSCULAR | Status: DC

## 2019-06-29 MED ORDER — FENTANYL CITRATE (PF) 100 MCG/2ML IJ SOLN
50.0000 ug | INTRAMUSCULAR | Status: DC | PRN
  Administered 2019-06-29 (×2): 50 ug via INTRAVENOUS
  Filled 2019-06-29 (×2): qty 2

## 2019-06-29 MED ORDER — IOHEXOL 300 MG/ML  SOLN
100.0000 mL | Freq: Once | INTRAMUSCULAR | Status: AC | PRN
  Administered 2019-06-29: 20:00:00 100 mL via INTRAVENOUS

## 2019-06-29 MED ORDER — ONDANSETRON HCL 4 MG/2ML IJ SOLN
4.0000 mg | Freq: Once | INTRAMUSCULAR | Status: AC
  Administered 2019-06-29: 4 mg via INTRAVENOUS
  Filled 2019-06-29: qty 2
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
## 2019-06-29 NOTE — ED Notes (Signed)
Portable  xrays at present chest pelvis
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
## 2019-06-29 NOTE — ED Notes (Signed)
The pt is c/o pain in her pelvis lmp now
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
## 2019-06-29 NOTE — ED Notes (Signed)
Pt taken to CT.
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
## 2019-06-29 NOTE — ED Notes (Signed)
Pt given Sprite and crackers 

## 2019-06-29 NOTE — ED Notes (Signed)
Pt c/o shooting pain from L hip across abdomen. Will medicated then try to ambulate

## 2019-06-29 NOTE — ED Notes (Signed)
Warm blankets given on arrival
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
## 2019-06-29 NOTE — ED Notes (Signed)
Pt ambulatory in room with steady gait; reports decreased pain when ambulating

## 2019-07-01 ENCOUNTER — Emergency Department (HOSPITAL_COMMUNITY): Payer: Medicaid Other

## 2019-07-01 ENCOUNTER — Encounter (HOSPITAL_COMMUNITY): Payer: Self-pay

## 2019-07-01 ENCOUNTER — Other Ambulatory Visit: Payer: Self-pay

## 2019-07-01 ENCOUNTER — Emergency Department (HOSPITAL_COMMUNITY)
Admission: EM | Admit: 2019-07-01 | Discharge: 2019-07-01 | Disposition: A | Discharge status: Home / Self Care | Payer: Medicaid Other | Attending: Emergency Medicine | Admitting: Emergency Medicine

## 2019-07-01 DIAGNOSIS — G8911 Acute pain due to trauma: Secondary | ICD-10-CM | POA: Diagnosis not present

## 2019-07-01 DIAGNOSIS — F17210 Nicotine dependence, cigarettes, uncomplicated: Secondary | ICD-10-CM | POA: Insufficient documentation

## 2019-07-01 DIAGNOSIS — R0781 Pleurodynia: Secondary | ICD-10-CM

## 2019-07-01 MED ORDER — CYCLOBENZAPRINE HCL 10 MG PO TABS: 10.0000 mg | ORAL_TABLET | Freq: Three times a day (TID) | ORAL | 0 refills | Status: DC | PRN

## 2019-07-01 MED ORDER — IBUPROFEN 800 MG PO TABS: 800.0000 mg | ORAL_TABLET | Freq: Three times a day (TID) | ORAL | 0 refills | Status: DC | PRN

## 2019-07-01 MED ORDER — OXYCODONE-ACETAMINOPHEN 5-325 MG PO TABS
1.0000 | ORAL_TABLET | Freq: Once | ORAL | Status: AC
  Administered 2019-07-01: 1 via ORAL
  Filled 2019-07-01: qty 1

## 2019-07-01 MED ORDER — OXYCODONE-ACETAMINOPHEN 5-325 MG PO TABS: 1.0000 | ORAL_TABLET | Freq: Four times a day (QID) | ORAL | 0 refills | Status: DC | PRN

## 2019-07-01 NOTE — ED Notes (Signed)
Patient verbalizes understanding of discharge instructions. Opportunity for questioning and answers were provided. Armband removed by staff, pt discharged from ED.  
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
## 2019-07-01 NOTE — Discharge Instructions (Signed)
Please take your medications, as prescribed.  Please discontinue and properly dispose of your previous narcotic medication.   You were given narcotic and or sedative medications while in the emergency department. Do not drive. Do not use machinery or power tools. Do not sign legal documents. Do not drink alcohol. Do not take sleeping pills. Do not supervise children by yourself. Do not participate in activities that require climbing or being in high places.  You were given a prescription for Flexeril which is a muscle relaxer.  You should not drive, work, consume alcohol, or operate machinery while taking this medication as it can make you very drowsy.  Be especially careful if taking in conjunction with Percocet.  Please return to the ED for any new or worsening symptoms.  Follow-up with your orthopedist in 2 weeks.
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
## 2019-07-02 ENCOUNTER — Encounter (HOSPITAL_COMMUNITY): Payer: Self-pay

## 2019-07-12 ENCOUNTER — Emergency Department (HOSPITAL_COMMUNITY)
Admission: EM | Admit: 2019-07-12 | Discharge: 2019-07-12 | Disposition: A | Discharge status: Home / Self Care | Payer: Medicaid Other | Attending: Emergency Medicine | Admitting: Emergency Medicine

## 2019-07-12 ENCOUNTER — Encounter (HOSPITAL_COMMUNITY): Payer: Self-pay

## 2019-07-12 ENCOUNTER — Other Ambulatory Visit: Payer: Self-pay

## 2019-07-12 ENCOUNTER — Emergency Department (HOSPITAL_COMMUNITY): Payer: Medicaid Other

## 2019-07-12 DIAGNOSIS — M25551 Pain in right hip: Secondary | ICD-10-CM | POA: Insufficient documentation

## 2019-07-12 DIAGNOSIS — J45909 Unspecified asthma, uncomplicated: Secondary | ICD-10-CM | POA: Insufficient documentation

## 2019-07-12 DIAGNOSIS — Z79899 Other long term (current) drug therapy: Secondary | ICD-10-CM | POA: Insufficient documentation

## 2019-07-12 DIAGNOSIS — F17210 Nicotine dependence, cigarettes, uncomplicated: Secondary | ICD-10-CM | POA: Diagnosis not present

## 2019-07-12 MED ORDER — OXYCODONE-ACETAMINOPHEN 5-325 MG PO TABS: 1.0000 | ORAL_TABLET | Freq: Three times a day (TID) | ORAL | 0 refills | Status: DC | PRN

## 2019-07-12 MED ORDER — PREDNISONE 10 MG (21) PO TBPK: ORAL_TABLET | Freq: Every day | ORAL | 0 refills | Status: DC

## 2019-07-12 MED ORDER — LIDOCAINE 5 % EX PTCH: 1.0000 | MEDICATED_PATCH | CUTANEOUS | 0 refills | Status: AC

## 2019-07-12 MED ORDER — OXYCODONE-ACETAMINOPHEN 5-325 MG PO TABS
2.0000 | ORAL_TABLET | Freq: Once | ORAL | Status: AC
  Administered 2019-07-12: 2 via ORAL
  Filled 2019-07-12: qty 2
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
## 2019-07-12 NOTE — Discharge Instructions (Addendum)
You were seen in the emergency department today for lower back/tailbone pain.  Your x-rays did not show any new findings compared to prior imaging.  We are sending you home with the following medicines:  - Prednisone-this is a steroid taper to help with inflammation, please take this as prescribed.  Please take this with food in the morning.  Steroids can cause stomach upset and or stomach bleeding therefore is important to take this with food and avoid anti-inflammatories with this such as naproxen, ibuprofen, Motrin, Aleve, Advil, Mobic, Goody powder, etc. -Lidoderm patches: Please apply 1 patch directly over the area for significant pain in your lower back/tailbone area once per day.  This is a patch to help numb/to the area. -Percocet-this is a narcotic/controlled substance medication that has potential addicting qualities.  We recommend that you take 1-2 tablets every 6 hours as needed for severe pain.  Do not drive or operate heavy machinery when taking this medicine as it can be sedating. Do not drink alcohol or take other sedating medications when taking this medicine for safety reasons.  Keep this out of reach of small children.  Please be aware this medicine has Tylenol in it (325 mg/tab) do not exceed the maximum dose of Tylenol in a day per over the counter recommendations should you decide to supplement with Tylenol over the counter.   We have prescribed you new medication(s) today. Discuss the medications prescribed today with your pharmacist as they can have adverse effects and interactions with your other medicines including over the counter and prescribed medications. Seek medical evaluation if you start to experience new or abnormal symptoms after taking one of these medicines, seek care immediately if you start to experience difficulty breathing, feeling of your throat closing, facial swelling, or rash as these could be indications of a more serious allergic reaction  Please follow-up at  your orthopedic appointment as scheduled.  Return to the ER for new or worsening symptoms including but not limited to worsening pain, numbness/weakness in your legs, loss of control of your bowel or bladder function, fever, inability to walk, or any other concerns.
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
## 2019-07-12 NOTE — ED Notes (Signed)
Patient verbalizes understanding of discharge instructions. Opportunity for questioning and answers were provided. Armband removed by staff, pt discharged from ED ambulatory.   
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
## 2022-02-17 ENCOUNTER — Ambulatory Visit (HOSPITAL_COMMUNITY)
Admission: EM | Admit: 2022-02-17 | Discharge: 2022-02-17 | Disposition: A | Discharge status: Home / Self Care | Payer: No Typology Code available for payment source | Attending: Emergency Medicine | Admitting: Emergency Medicine

## 2022-02-17 ENCOUNTER — Encounter (HOSPITAL_COMMUNITY): Payer: Self-pay | Admitting: Emergency Medicine

## 2022-02-17 DIAGNOSIS — H00015 Hordeolum externum left lower eyelid: Secondary | ICD-10-CM | POA: Diagnosis not present

## 2022-02-17 DIAGNOSIS — H00016 Hordeolum externum left eye, unspecified eyelid: Secondary | ICD-10-CM

## 2022-02-17 DIAGNOSIS — H00014 Hordeolum externum left upper eyelid: Secondary | ICD-10-CM

## 2022-02-17 MED ORDER — GENTAMICIN SULFATE 0.3 % OP SOLN: 1.0000 [drp] | OPHTHALMIC | 0 refills | Status: DC

## 2022-02-17 NOTE — ED Triage Notes (Signed)
Pt is present today with left eye irritation and pain. Pt sx started today. Pt states the pain is causing blurry vision while wearing her contacts.

## 2022-02-17 NOTE — ED Provider Notes (Signed)
[facility]    CSN: [id number] Arrival date & time: 02/17/22  1744      History   Chief Complaint Chief Complaint  Patient presents with   Eye Pain   Eye Drainage    HPI [name] is a 36 y.o. female.  Patient reports a small bump on her upper left and on her bottom left eyelids that started today.  She has been using over-the-counter eyedrops and washing her contacts before using them to try to treat it.  She has not tried warm compresses.  Complains of pain.  Denies discharge or vision changes   Eye Pain    Past Medical History:  Diagnosis Date   Abnormal Pap smear    Acute gallstone pancreatitis 08/30/2014   Anemia 2007   Anxiety    Asthma    Chlamydia infection    has no taken treatment as of 01/03/2013   Depression    Headache(784.0)    migraines   HPV (human papilloma virus) anogenital infection    Late prenatal care    22wks   Pancreatitis, acute 08/08/2014   PIH (pregnancy induced hypertension)    Pregnancy induced hypertension    Recurrent UTI    SVD (spontaneous vaginal delivery) 01/08/2013   Twin pregnancy in third trimester 08/08/2014   Vaginal Pap smear, abnormal     Patient Active Problem List   Diagnosis Date Noted   Abnormal cervical Papanicolaou smear 02/05/2019   Nasal septum perforation 03/19/2015   Acute cholecystitis 12/13/2014   SVD (spontaneous vaginal delivery) 09/18/2014   Breech extraction, delivered 09/18/2014   Acute gallstone pancreatitis 08/30/2014   Twin pregnancy in third trimester 08/08/2014   Pancreatitis, acute 08/08/2014   Asthma 06/13/2011   Depression 06/13/2011    Past Surgical History:  Procedure Laterality Date   CHOLECYSTECTOMY  12/13/2014   CHOLECYSTECTOMY N/A 12/13/2014   Procedure: LAPAROSCOPIC CHOLECYSTECTOMY WITH INTRAOPERATIVE CHOLANGIOGRAM;  Surgeon: Manus Rudd, MD;  Location: [facility];  Service: General;  Laterality: N/A;   LAPAROSCOPIC TUBAL LIGATION Bilateral 03/14/2015   Procedure:  LAPAROSCOPIC TUBAL LIGATION;  Surgeon: Lavina Hamman, MD;  Location: [facility];  Service: Gynecology;  Laterality: Bilateral;   VAGINAL DELIVERY N/A 09/18/2014   Procedure: VAGINAL DELIVERY;  Surgeon: Lavina Hamman, MD;  Location: [facility];  Service: Obstetrics;  Laterality: N/A;    OB History     Gravida  6   Para  4   Term  3   Preterm  1   AB  2   Living  5      SAB  2   IAB  0   Ectopic  0   Multiple  1   Live Births  5            Home Medications    Prior to Admission medications   Medication Sig Start Date End Date Taking? Authorizing Provider  gentamicin (GARAMYCIN) 0.3 % ophthalmic solution Place 1 drop into the left eye every 4 (four) hours. 02/17/22  Yes Cathlyn Parsons, NP  ferrous sulfate 325 (65 FE) MG tablet Take 1 tablet (325 mg total) by mouth daily with breakfast. Patient not taking: Reported on 02/07/2019 08/10/14   Huel Cote, MD  ibuprofen (ADVIL) 800 MG tablet Take 1 tablet (800 mg total) by mouth every 8 (eight) hours as needed. 07/01/19   Lawyer, Cristal Deer, PA-C  lidocaine (LIDODERM) 5 % Place 1 patch onto the skin daily. Place 1 patch onto area of most significant pain. Remove &  Discard patch within 12 hours. 07/12/19   Petrucelli, Pleas Koch, PA-C  oxyCODONE-acetaminophen (PERCOCET/ROXICET) 5-325 MG tablet Take 1 tablet by mouth every 8 (eight) hours as needed for severe pain. 07/12/19   Petrucelli, Samantha R, PA-C  predniSONE (STERAPRED UNI-PAK 21 TAB) 10 MG (21) TBPK tablet Take by mouth daily. 6, 5, 4, 3, 2, 1 07/12/19   Petrucelli, Lelon Mast R, PA-C    Family History Family History  Problem Relation Age of Onset   Diabetes Mother    Heart disease Maternal Grandmother        clots in heart   Stroke Maternal Grandmother    Heart disease Maternal Grandfather        pacemaker   Miscarriages / Stillbirths Paternal Aunt    Stroke Paternal Grandmother    Anesthesia problems Neg Hx    Hypotension Neg Hx    Malignant hyperthermia Neg  Hx    Pseudochol deficiency Neg Hx     Social History Social History   Tobacco Use   Smoking status: Every Day    Packs/day: 0.25    Years: 5.00    Total pack years: 1.25    Types: Cigarettes   Smokeless tobacco: Current  Substance Use Topics   Alcohol use: No   Drug use: Yes     Allergies   Corn syrup, Tramadol, Hydrocodone-acetaminophen, and Ketorolac   Review of Systems Review of Systems  Eyes:  Positive for pain.     Physical Exam Triage Vital Signs ED Triage Vitals  Enc Vitals Group     BP 02/17/22 1856 110/64     Pulse Rate 02/17/22 1856 75     Resp 02/17/22 1856 18     Temp 02/17/22 1856 98.1 F (36.7 C)     Temp src --      SpO2 02/17/22 1856 99 %     Weight --      Height --      Head Circumference --      Peak Flow --      Pain Score 02/17/22 1857 7     Pain Loc --      Pain Edu? --      Excl. in GC? --    No data found.  Updated Vital Signs BP 110/64   Pulse 75   Temp 98.1 F (36.7 C)   Resp 18   SpO2 99%   Breastfeeding No   Visual Acuity Right Eye Distance:   Left Eye Distance:   Bilateral Distance:    Right Eye Near:   Left Eye Near:    Bilateral Near:     Physical Exam Constitutional:      General: She is not in acute distress.    Appearance: Normal appearance. She is not ill-appearing.  Eyes:     General:        Right eye: No hordeolum.        Left eye: Hordeolum present.    Conjunctiva/sclera:     Right eye: Right conjunctiva is not injected.     Left eye: Left conjunctiva is not injected.   Pulmonary:     Effort: Pulmonary effort is normal.  Neurological:     Mental Status: She is alert.      UC Treatments / Results  Labs (all labs ordered are listed, but only abnormal results are displayed) Labs Reviewed - No data to display  EKG   Radiology No results found.  Procedures Procedures (including critical care time)  Medications  Ordered in UC Medications - No data to display  Initial Impression /  Assessment and Plan / UC Course  I have reviewed the triage vital signs and the nursing notes.  Pertinent labs & imaging results that were available during my care of the patient were reviewed by me and considered in my medical decision making (see chart for details).    Prescribed gentamicin eyedrops.  Reviewed using warm compresses.  Instructed patient to avoid wearing her contacts but patient reports she cannot see without them and does not have glasses.  Final Clinical Impressions(s) / UC Diagnoses   Final diagnoses:  Hordeolum externum of left eye, unspecified eyelid   Discharge Instructions   None    ED Prescriptions     Medication Sig Dispense Auth. Provider   gentamicin (GARAMYCIN) 0.3 % ophthalmic solution Place 1 drop into the left eye every 4 (four) hours. 5 mL Carvel Getting, NP      PDMP not reviewed this encounter.   Carvel Getting, NP 02/17/22 1919

## 2024-01-11 ENCOUNTER — Encounter (HOSPITAL_COMMUNITY): Payer: Self-pay | Admitting: *Deleted

## 2024-01-11 ENCOUNTER — Other Ambulatory Visit: Payer: Self-pay

## 2024-01-11 ENCOUNTER — Emergency Department (HOSPITAL_COMMUNITY)

## 2024-01-11 ENCOUNTER — Ambulatory Visit (HOSPITAL_COMMUNITY): Admission: EM | Admit: 2024-01-11 | Discharge: 2024-01-11 | Disposition: A | Discharge status: Home / Self Care

## 2024-01-11 ENCOUNTER — Emergency Department (HOSPITAL_COMMUNITY)
Admission: EM | Admit: 2024-01-11 | Discharge: 2024-01-12 | Disposition: A | Discharge status: Home / Self Care | Attending: Emergency Medicine | Admitting: Emergency Medicine

## 2024-01-11 ENCOUNTER — Encounter (HOSPITAL_COMMUNITY): Payer: Self-pay

## 2024-01-11 DIAGNOSIS — R0781 Pleurodynia: Secondary | ICD-10-CM | POA: Diagnosis not present

## 2024-01-11 DIAGNOSIS — J45909 Unspecified asthma, uncomplicated: Secondary | ICD-10-CM | POA: Insufficient documentation

## 2024-01-11 DIAGNOSIS — R0602 Shortness of breath: Secondary | ICD-10-CM

## 2024-01-11 DIAGNOSIS — R0789 Other chest pain: Secondary | ICD-10-CM | POA: Insufficient documentation

## 2024-01-11 DIAGNOSIS — F17200 Nicotine dependence, unspecified, uncomplicated: Secondary | ICD-10-CM | POA: Diagnosis not present

## 2024-01-11 DIAGNOSIS — R079 Chest pain, unspecified: Secondary | ICD-10-CM | POA: Diagnosis not present

## 2024-01-11 NOTE — ED Provider Notes (Signed)
 [facility]    CSN: [id number] Arrival date & time: 01/11/24  1930      History   Chief Complaint Chief Complaint  Patient presents with   Rib Injury    HPI [name] is a 38 y.o. female.   Patient presents with right sided rib pain x 1 week.  Patient states that about a week ago she was trying to catch her grandfather that fell and she has had the pain since then.  Patient states the pain is worse with movement.  Patient reports that a week prior to this she was having some cough and congestion as well.  Patient denies any known fever.  The history is provided by the patient and medical records.    Past Medical History:  Diagnosis Date   Abnormal Pap smear    Acute gallstone pancreatitis 08/30/2014   Anemia 2007   Anxiety    Asthma    Chlamydia infection    has no taken treatment as of 01/03/2013   Depression    Headache(784.0)    migraines   HPV (human papilloma virus) anogenital infection    Late prenatal care    22wks   Pancreatitis, acute 08/08/2014   PIH (pregnancy induced hypertension)    Pregnancy induced hypertension    Recurrent UTI    SVD (spontaneous vaginal delivery) 01/08/2013   Twin pregnancy in third trimester 08/08/2014   Vaginal Pap smear, abnormal     Patient Active Problem List   Diagnosis Date Noted   Abnormal cervical Papanicolaou smear 02/05/2019   Nasal septum perforation 03/19/2015   Acute cholecystitis 12/13/2014   SVD (spontaneous vaginal delivery) 09/18/2014   Breech extraction, delivered 09/18/2014   Acute gallstone pancreatitis 08/30/2014   Twin pregnancy in third trimester 08/08/2014   Pancreatitis, acute 08/08/2014   Asthma 06/13/2011   Depression 06/13/2011    Past Surgical History:  Procedure Laterality Date   CHOLECYSTECTOMY  12/13/2014   CHOLECYSTECTOMY N/A 12/13/2014   Procedure: LAPAROSCOPIC CHOLECYSTECTOMY WITH INTRAOPERATIVE CHOLANGIOGRAM;  Surgeon: Dareen Ebbing, MD;  Location: [facility];  Service:  General;  Laterality: N/A;   LAPAROSCOPIC TUBAL LIGATION Bilateral 03/14/2015   Procedure: LAPAROSCOPIC TUBAL LIGATION;  Surgeon: Cyd Dowse, MD;  Location: [facility];  Service: Gynecology;  Laterality: Bilateral;   VAGINAL DELIVERY N/A 09/18/2014   Procedure: VAGINAL DELIVERY;  Surgeon: Cyd Dowse, MD;  Location: [facility];  Service: Obstetrics;  Laterality: N/A;    OB History     Gravida  6   Para  4   Term  3   Preterm  1   AB  2   Living  5      SAB  2   IAB  0   Ectopic  0   Multiple  1   Live Births  5            Home Medications    Prior to Admission medications   Medication Sig Start Date End Date Taking? Authorizing Provider  ferrous sulfate  325 (65 FE) MG tablet Take 1 tablet (325 mg total) by mouth daily with breakfast. Patient not taking: Reported on 02/07/2019 08/10/14   Rogene Claude, MD  gentamicin  (GARAMYCIN ) 0.3 % ophthalmic solution Place 1 drop into the left eye every 4 (four) hours. 02/17/22   Blinda Burger, NP  ibuprofen  (ADVIL ) 800 MG tablet Take 1 tablet (800 mg total) by mouth every 8 (eight) hours as needed. 07/01/19   Lawyer, Veryl Gottron, PA-C  lidocaine  (LIDODERM ) 5 %  Place 1 patch onto the skin daily. Place 1 patch onto area of most significant pain. Remove & Discard patch within 12 hours. 07/12/19   Petrucelli, Samantha R, PA-C  oxyCODONE -acetaminophen  (PERCOCET/ROXICET) 5-325 MG tablet Take 1 tablet by mouth every 8 (eight) hours as needed for severe pain. 07/12/19   Petrucelli, Samantha R, PA-C  predniSONE  (STERAPRED UNI-PAK 21 TAB) 10 MG (21) TBPK tablet Take by mouth daily. 6, 5, 4, 3, 2, 1 07/12/19   Petrucelli, Arlon Bergamo, PA-C    Family History Family History  Problem Relation Age of Onset   Diabetes Mother    Heart disease Maternal Grandmother        clots in heart   Stroke Maternal Grandmother    Heart disease Maternal Grandfather        pacemaker   Miscarriages / Stillbirths Paternal Aunt    Stroke Paternal  Grandmother    Anesthesia problems Neg Hx    Hypotension Neg Hx    Malignant hyperthermia Neg Hx    Pseudochol deficiency Neg Hx     Social History Social History   Tobacco Use   Smoking status: Every Day    Current packs/day: 0.25    Average packs/day: 0.3 packs/day for 5.0 years (1.3 ttl pk-yrs)    Types: Cigarettes   Smokeless tobacco: Current  Substance Use Topics   Alcohol use: No   Drug use: Yes     Allergies   Corn syrup, Tramadol , Hydrocodone -acetaminophen , and Ketorolac    Review of Systems Review of Systems  Per HPI  Physical Exam Triage Vital Signs ED Triage Vitals  Encounter Vitals Group     BP 01/11/24 2001 121/63     Systolic BP Percentile --      Diastolic BP Percentile --      Pulse Rate 01/11/24 2001 (!) 59     Resp 01/11/24 2001 16     Temp 01/11/24 2001 98.3 F (36.8 C)     Temp Source 01/11/24 2001 Oral     SpO2 01/11/24 2001 98 %     Weight --      Height --      Head Circumference --      Peak Flow --      Pain Score 01/11/24 2003 8     Pain Loc --      Pain Education --      Exclude from Growth Chart --    No data found.  Updated Vital Signs BP 121/63 (BP Location: Right Arm)   Pulse (!) 59   Temp 98.3 F (36.8 C) (Oral)   Resp 16   LMP 01/04/2024 (Exact Date)   SpO2 98%   Visual Acuity Right Eye Distance:   Left Eye Distance:   Bilateral Distance:    Right Eye Near:   Left Eye Near:    Bilateral Near:     Physical Exam Vitals and nursing note reviewed.  Constitutional:      General: She is awake.     Appearance: Normal appearance. She is well-developed and well-groomed.  Cardiovascular:     Rate and Rhythm: Normal rate and regular rhythm.  Pulmonary:     Effort: Pulmonary effort is normal. Tachypnea present.     Breath sounds: Normal breath sounds.  Chest:     Chest wall: Tenderness present.     Comments: Tenderness noted over the right lower rib cage. Skin:    General: Skin is warm and dry.  Neurological:  Mental Status: She is alert.  Psychiatric:        Behavior: Behavior is cooperative.      UC Treatments / Results  Labs (all labs ordered are listed, but only abnormal results are displayed) Labs Reviewed - No data to display  EKG   Radiology No results found.  Procedures Procedures (including critical care time)  Medications Ordered in UC Medications - No data to display  Initial Impression / Assessment and Plan / UC Course  I have reviewed the triage vital signs and the nursing notes.  Pertinent labs & imaging results that were available during my care of the patient were reviewed by me and considered in my medical decision making (see chart for details).     Upon entering the room patient is clutching her right side.  Patient appears to be tachypneic as well.  Significant tenderness noted over the right lower rib cage.  Lungs clear bilaterally on auscultation.  X-ray is currently down here at the urgent care.  Recommended patient be seen in the emergency department for further evaluation due to severity of symptoms.  Patient is agreeable to plan at this time.  Patient is stable to arrived to the ER via POV. Final Clinical Impressions(s) / UC Diagnoses   Final diagnoses:  Rib pain on right side  Shortness of breath     Discharge Instructions      Please go to the emergency department for further evaluation of your symptoms.  ED Prescriptions   None    PDMP not reviewed this encounter.   Levora Reas A, NP 01/11/24 2013

## 2024-01-11 NOTE — ED Triage Notes (Signed)
 One week ago she was attempting to help someone who was falling and they fell into her rt mid lateral chest  the pain has increased  the pain is worse with movement lmp last week

## 2024-01-11 NOTE — Discharge Instructions (Signed)
Please go to the emergency department for further evaluation of your symptoms.

## 2024-01-11 NOTE — ED Triage Notes (Addendum)
 Pt states pain to right rib area for the past week.  States she injured it when  her grandfather fell and she tried to catch him. States the pain is worse with movement.

## 2024-01-11 NOTE — ED Notes (Signed)
 Patient is being discharged from the Urgent Care and sent to the Emergency Department via private vehicle . Per Levora Reas, patient is in need of higher level of care due to SOB and possible rib injury. Patient is aware and verbalizes understanding of plan of care.  Vitals:   01/11/24 2001  BP: 121/63  Pulse: (!) 59  Resp: 16  Temp: 98.3 F (36.8 C)  SpO2: 98%

## 2024-01-11 NOTE — ED Provider Triage Note (Signed)
 Emergency Medicine Provider Triage Evaluation Note  [name] , a 38 y.o. female  was evaluated in triage.  Pt complains of pain in her right lateral ribs after her elderly grandfather fell against her about 5 days ago.  No relief with Tylenol  Motrin  at home.   Review of Systems  Positive: As above Negative: As above  Physical Exam  BP 129/70 (BP Location: Right Arm)   Pulse 70   Temp 98.6 F (37 C)   Resp 16   Ht 5\' 7"  (1.702 m)   Wt 71.7 kg   LMP 01/04/2024 (Exact Date)   SpO2 100%   BMI 24.76 kg/m  Gen:   Awake, no distress , appears uncomfortable Resp:  Normal effort  MSK:   Moves extremities without difficulty  Other:    Medical Decision Making  Medically screening exam initiated at 9:01 PM.  Appropriate orders placed.  [name] was informed that the remainder of the evaluation will be completed by another provider, this initial triage assessment does not replace that evaluation, and the importance of remaining in the ED until their evaluation is complete.     Aimee Houseman, [place] 01/11/24 2102

## 2024-01-12 NOTE — ED Provider Notes (Signed)
 [place] [facility]PARTMENT AT [facility] Provider Note   CSN: [id number] Arrival date & time: 01/11/24  2015     History  Chief Complaint  Patient presents with   Chest Pain    [name] is a 38 y.o. female.  Patient with past medical history seen for asthma, depression presents the emergency room complaining of right sided rib pain.  She states that she was catching her grandfather who was falling yesterday and they fell into her right/mid lateral chest.  Pain is worse with movement.  She denies associated shortness of breath.  She went to an urgent care earlier this evening but unfortunately the x-ray was down at the urgent care and they recommended she come to the emergency department for further evaluation.   Chest Pain      Home Medications Prior to Admission medications   Medication Sig Start Date End Date Taking? Authorizing Provider  ferrous sulfate  325 (65 FE) MG tablet Take 1 tablet (325 mg total) by mouth daily with breakfast. Patient not taking: Reported on 02/07/2019 08/10/14   Rogene Claude, MD  gentamicin  (GARAMYCIN ) 0.3 % ophthalmic solution Place 1 drop into the left eye every 4 (four) hours. 02/17/22   Blinda Burger, NP  ibuprofen  (ADVIL ) 800 MG tablet Take 1 tablet (800 mg total) by mouth every 8 (eight) hours as needed. 07/01/19   Lawyer, Veryl Gottron, PA-C  lidocaine  (LIDODERM ) 5 % Place 1 patch onto the skin daily. Place 1 patch onto area of most significant pain. Remove & Discard patch within 12 hours. 07/12/19   Petrucelli, Samantha R, PA-C  oxyCODONE -acetaminophen  (PERCOCET/ROXICET) 5-325 MG tablet Take 1 tablet by mouth every 8 (eight) hours as needed for severe pain. 07/12/19   Petrucelli, Samantha R, PA-C  predniSONE  (STERAPRED UNI-PAK 21 TAB) 10 MG (21) TBPK tablet Take by mouth daily. 6, 5, 4, 3, 2, 1 07/12/19   Petrucelli, Samantha R, PA-C      Allergies    Corn syrup, Tramadol , Hydrocodone -acetaminophen , and Ketorolac      Review of Systems   Review of Systems  Cardiovascular:  Positive for chest pain.    Physical Exam Updated Vital Signs BP 113/64 (BP Location: Left Arm)   Pulse 64   Temp 98.3 F (36.8 C) (Oral)   Resp 16   Ht 5\' 7"  (1.702 m)   Wt 71.7 kg   LMP 01/04/2024 (Exact Date)   SpO2 100%   BMI 24.76 kg/m  Physical Exam Vitals and nursing note reviewed.  Constitutional:      General: She is not in acute distress.    Appearance: She is well-developed.  HENT:     Head: Normocephalic and atraumatic.  Eyes:     Conjunctiva/sclera: Conjunctivae normal.  Cardiovascular:     Rate and Rhythm: Normal rate and regular rhythm.  Pulmonary:     Effort: Pulmonary effort is normal. No respiratory distress.     Breath sounds: Normal breath sounds.  Chest:     Chest wall: Tenderness present.     Comments: Tenderness to palpation of the right anterolateral chest.  No crepitus, deformity.  No paradoxical movement Abdominal:     Palpations: Abdomen is soft.     Tenderness: There is no abdominal tenderness.  Musculoskeletal:        General: No swelling.     Cervical back: Neck supple.  Skin:    General: Skin is warm and dry.     Capillary Refill: Capillary refill takes less than  2 seconds.  Neurological:     Mental Status: She is alert.  Psychiatric:        Mood and Affect: Mood normal.     ED Results / Procedures / Treatments   Labs (all labs ordered are listed, but only abnormal results are displayed) Labs Reviewed - No data to display  EKG None  Radiology DG Ribs Unilateral W/Chest Right Result Date: 01/11/2024 CLINICAL DATA:  Pain after injury EXAM: RIGHT RIBS AND CHEST - 3+ VIEW COMPARISON:  None Available. FINDINGS: Normal cardiomediastinal silhouette. No focal consolidation, pleural effusion, or pneumothorax. The area of symptomatic concern as indicated by the patient was denoted with a metallic skin BB by the technologist. No displaced rib fractures. IMPRESSION: No displaced  rib fractures. Electronically Signed   By: Rozell Cornet M.D.   On: 01/11/2024 21:16    Procedures Procedures    Medications Ordered in ED Medications - No data to display  ED Course/ Medical Decision Making/ A&P                                 Medical Decision Making  This patient presents to the ED for concern of rib pain, this involves an extensive number of treatment options, and is a complaint that carries with it a high risk of complications and morbidity.  The differential diagnosis includes fracture, dislocation, soft tissue injury, others   Co morbidities / Chronic conditions that complicate the patient evaluation  Asthma   Additional history obtained:  Additional history obtained from EMR External records from outside source obtained and reviewed including urgent care notes   Imaging Studies ordered:  I ordered imaging studies including chest x-ray I independently visualized and interpreted imaging which showed no acute findings I agree with the radiologist interpretation   Social Determinants of Health:  Patient is a daily smoker   Test / Admission - Considered:  Images show no fracture or dislocation.  Patient is able to take a deep breath without difficulty.  She does have some tenderness to palpation.  I have recommended anti-inflammatories and Tylenol  for pain control at home.  No indication for further emergent workup at this time.  Patient stable for discharge.         Final Clinical Impression(s) / ED Diagnoses Final diagnoses:  Right-sided chest wall pain    Rx / DC Orders ED Discharge Orders     None         Delories Fetter 01/12/24 0135    Lindle Rhea, MD 01/13/24 [id number]

## 2024-01-12 NOTE — Discharge Instructions (Signed)
 Your x-ray today was reassuring.  I recommend taking ibuprofen  or Aleve  along with acetaminophen  for pain control at home.  You may consider icing the area if you find it beneficial.  This should improve over time.  If you develop any life-threatening symptoms return to the emergency department.

## 2024-04-02 ENCOUNTER — Other Ambulatory Visit: Payer: Self-pay

## 2024-04-02 ENCOUNTER — Encounter (HOSPITAL_BASED_OUTPATIENT_CLINIC_OR_DEPARTMENT_OTHER): Payer: Self-pay | Admitting: Oral Surgery

## 2024-04-05 NOTE — H&P (Signed)
   Patient: [name]  PID: [id number]  DOB: 1986-02-11  SEX: Female   Self Referral  CC: Want all teeth out. Constant dental pain.  Past Medical History:  Asthma, Smoker, Bruise Easily, Low Blood Sugar, Anxiety, Depression    Medications: Benadryl , Albuterol     Allergies:     Tramadol     Surgeries:   gallbladder removal, Nose, Tubes Tied, dental     Social History       Smoking:            Alcohol: Drug use:                             Exam: BMI 26. Multiple caries teeth # 6, 11, 12, 13, 21, 22, 24, 25, 26, 27, 28; multiple missing teeth.  No purulence, edema, fluctuance, trismus. Oral cancer screening negative. Pharynx clear. No lymphadenopathy.  Panorex:Multiple caries teeth # 6, 11, 12, 13, 21, 22, 24, 25, 26, 27, 28; multiple missing teeth.  Assessment: ASA 2. Non-restorable teeth # 3, 6, 11, 12, 13, 14, 15, 21, 22, 24, 25, 26, 27, 28.               Plan: Extraction Teeth #  3, 6, 11, 12, 13, 14, 15, 21, 22, 24, 25, 26, 27, 28.   Alveoloplasty.  Hospital Day surgery.                 Rx: n               Risks and complications explained. Questions answered.   Glendia EMERSON Primrose, DMD

## 2024-04-06 ENCOUNTER — Ambulatory Visit (HOSPITAL_BASED_OUTPATIENT_CLINIC_OR_DEPARTMENT_OTHER): Admitting: Anesthesiology

## 2024-04-06 ENCOUNTER — Encounter (HOSPITAL_BASED_OUTPATIENT_CLINIC_OR_DEPARTMENT_OTHER): Payer: Self-pay | Admitting: Oral Surgery

## 2024-04-06 ENCOUNTER — Encounter (HOSPITAL_BASED_OUTPATIENT_CLINIC_OR_DEPARTMENT_OTHER)
Admission: RE | Disposition: A | Discharge status: Home / Self Care | Payer: Self-pay | Source: Home / Self Care | Attending: Oral Surgery

## 2024-04-06 ENCOUNTER — Other Ambulatory Visit: Payer: Self-pay

## 2024-04-06 ENCOUNTER — Ambulatory Visit (HOSPITAL_BASED_OUTPATIENT_CLINIC_OR_DEPARTMENT_OTHER)
Admission: RE | Admit: 2024-04-06 | Discharge: 2024-04-06 | Disposition: A | Discharge status: Home / Self Care | Attending: Oral Surgery | Admitting: Oral Surgery

## 2024-04-06 DIAGNOSIS — K029 Dental caries, unspecified: Secondary | ICD-10-CM

## 2024-04-06 DIAGNOSIS — F17200 Nicotine dependence, unspecified, uncomplicated: Secondary | ICD-10-CM | POA: Insufficient documentation

## 2024-04-06 DIAGNOSIS — J45909 Unspecified asthma, uncomplicated: Secondary | ICD-10-CM | POA: Diagnosis not present

## 2024-04-06 HISTORY — PX: MULTIPLE EXTRACTIONS WITH ALVEOLOPLASTY: SHX5342

## 2024-04-06 LAB — POCT PREGNANCY, URINE: Preg Test, Ur: NEGATIVE

## 2024-04-06 SURGERY — MULTIPLE EXTRACTION WITH ALVEOLOPLASTY
Anesthesia: General | Site: Mouth

## 2024-04-06 MED ORDER — CHLORHEXIDINE GLUCONATE 0.12 % MT SOLN: 15.0000 mL | Freq: Two times a day (BID) | OROMUCOSAL | 0 refills | Status: AC

## 2024-04-06 MED ORDER — ACETAMINOPHEN 10 MG/ML IV SOLN
INTRAVENOUS | Status: AC
  Filled 2024-04-06: qty 100

## 2024-04-06 MED ORDER — OXYCODONE-ACETAMINOPHEN 5-325 MG PO TABS: 1.0000 | ORAL_TABLET | ORAL | 0 refills | Status: AC | PRN

## 2024-04-06 MED ORDER — ROCURONIUM 10MG/ML (10ML) SYRINGE FOR MEDFUSION PUMP - OPTIME
INTRAVENOUS | Status: DC | PRN
  Administered 2024-04-06: 50 mg via INTRAVENOUS

## 2024-04-06 MED ORDER — LIDOCAINE 2% (20 MG/ML) 5 ML SYRINGE
INTRAMUSCULAR | Status: DC | PRN
  Administered 2024-04-06: 100 mg via INTRAVENOUS

## 2024-04-06 MED ORDER — LACTATED RINGERS IV SOLN: INTRAVENOUS | Status: DC

## 2024-04-06 MED ORDER — 0.9 % SODIUM CHLORIDE (POUR BTL) OPTIME
TOPICAL | Status: DC | PRN
  Administered 2024-04-06: 1000 mL

## 2024-04-06 MED ORDER — LIDOCAINE 2% (20 MG/ML) 5 ML SYRINGE
INTRAMUSCULAR | Status: AC
  Filled 2024-04-06: qty 5

## 2024-04-06 MED ORDER — DEXMEDETOMIDINE HCL IN NACL 200 MCG/50ML IV SOLN
INTRAVENOUS | Status: DC | PRN
  Administered 2024-04-06: 12 ug via INTRAVENOUS

## 2024-04-06 MED ORDER — OXYCODONE HCL 5 MG PO TABS
5.0000 mg | ORAL_TABLET | Freq: Once | ORAL | Status: AC | PRN
  Administered 2024-04-06: 5 mg via ORAL

## 2024-04-06 MED ORDER — FENTANYL CITRATE (PF) 100 MCG/2ML IJ SOLN
INTRAMUSCULAR | Status: DC | PRN
  Administered 2024-04-06: 100 ug via INTRAVENOUS

## 2024-04-06 MED ORDER — PROPOFOL 10 MG/ML IV BOLUS
INTRAVENOUS | Status: AC
  Filled 2024-04-06: qty 20

## 2024-04-06 MED ORDER — DEXAMETHASONE SODIUM PHOSPHATE 4 MG/ML IJ SOLN
INTRAMUSCULAR | Status: DC | PRN
  Administered 2024-04-06: 5 mg via INTRAVENOUS

## 2024-04-06 MED ORDER — FENTANYL CITRATE (PF) 100 MCG/2ML IJ SOLN
25.0000 ug | INTRAMUSCULAR | Status: DC | PRN
  Administered 2024-04-06 (×3): 50 ug via INTRAVENOUS

## 2024-04-06 MED ORDER — ROCURONIUM BROMIDE 10 MG/ML (PF) SYRINGE
PREFILLED_SYRINGE | INTRAVENOUS | Status: AC
  Filled 2024-04-06: qty 10

## 2024-04-06 MED ORDER — DEXAMETHASONE SODIUM PHOSPHATE 10 MG/ML IJ SOLN
INTRAMUSCULAR | Status: AC
  Filled 2024-04-06: qty 1

## 2024-04-06 MED ORDER — CEFAZOLIN SODIUM-DEXTROSE 2-4 GM/100ML-% IV SOLN
INTRAVENOUS | Status: AC
  Filled 2024-04-06: qty 100

## 2024-04-06 MED ORDER — LIDOCAINE-EPINEPHRINE 2 %-1:100000 IJ SOLN
INTRAMUSCULAR | Status: DC | PRN
  Administered 2024-04-06: 20 mL

## 2024-04-06 MED ORDER — MIDAZOLAM HCL 5 MG/5ML IJ SOLN
INTRAMUSCULAR | Status: DC | PRN
  Administered 2024-04-06: 2 mg via INTRAVENOUS

## 2024-04-06 MED ORDER — AMOXICILLIN 500 MG PO CAPS: 500.0000 mg | ORAL_CAPSULE | Freq: Three times a day (TID) | ORAL | 0 refills | Status: AC

## 2024-04-06 MED ORDER — SUGAMMADEX SODIUM 200 MG/2ML IV SOLN
INTRAVENOUS | Status: DC | PRN
  Administered 2024-04-06: 300 mg via INTRAVENOUS

## 2024-04-06 MED ORDER — OXYCODONE HCL 5 MG PO TABS
ORAL_TABLET | ORAL | Status: AC
  Filled 2024-04-06: qty 1

## 2024-04-06 MED ORDER — OXYCODONE HCL 5 MG/5ML PO SOLN: 5.0000 mg | Freq: Once | ORAL | Status: AC | PRN

## 2024-04-06 MED ORDER — ONDANSETRON HCL 4 MG/2ML IJ SOLN
INTRAMUSCULAR | Status: DC | PRN
Start: 2024-04-06 — End: 2024-04-06
  Administered 2024-04-06: 4 mg via INTRAVENOUS

## 2024-04-06 MED ORDER — ONDANSETRON HCL 4 MG/2ML IJ SOLN
INTRAMUSCULAR | Status: AC
  Filled 2024-04-06: qty 2

## 2024-04-06 MED ORDER — MIDAZOLAM HCL 2 MG/2ML IJ SOLN
INTRAMUSCULAR | Status: AC
Start: 2024-04-06 — End: 2024-04-06
  Filled 2024-04-06: qty 2

## 2024-04-06 MED ORDER — PROPOFOL 10 MG/ML IV BOLUS
INTRAVENOUS | Status: DC | PRN
  Administered 2024-04-06: 200 mg via INTRAVENOUS

## 2024-04-06 MED ORDER — FENTANYL CITRATE (PF) 100 MCG/2ML IJ SOLN
INTRAMUSCULAR | Status: AC
  Filled 2024-04-06: qty 2

## 2024-04-06 MED ORDER — ACETAMINOPHEN 10 MG/ML IV SOLN
1000.0000 mg | Freq: Once | INTRAVENOUS | Status: AC
  Administered 2024-04-06: 1000 mg via INTRAVENOUS

## 2024-04-06 MED ORDER — CEFAZOLIN SODIUM-DEXTROSE 2-4 GM/100ML-% IV SOLN
2.0000 g | INTRAVENOUS | Status: AC
  Administered 2024-04-06: 2 g via INTRAVENOUS

## 2024-04-06 MED ORDER — DROPERIDOL 2.5 MG/ML IJ SOLN: 0.6250 mg | Freq: Once | INTRAMUSCULAR | Status: DC | PRN

## 2024-04-06 MED ORDER — SODIUM CHLORIDE 0.9 % IV SOLN
INTRAVENOUS | Status: AC | PRN
  Administered 2024-04-06: 500 mL

## 2024-04-06 SURGICAL SUPPLY — 34 items
BLADE SURG 15 STRL LF DISP TIS (BLADE) ×2 IMPLANT
BNDG COHESIVE 2X5 TAN ST LF (GAUZE/BANDAGES/DRESSINGS) IMPLANT
BNDG EYE OVAL 2 1/8 X 2 5/8 (GAUZE/BANDAGES/DRESSINGS) IMPLANT
BUR CROSS CUT FISSURE 1.6 (BURR) ×2 IMPLANT
BUR EGG ELITE 4.0 (BURR) ×1 IMPLANT
CANISTER SUCT 1200ML W/VALVE (MISCELLANEOUS) ×2 IMPLANT
CATH ROBINSON RED A/P 10FR (CATHETERS) IMPLANT
COVER BACK TABLE 60X90IN (DRAPES) ×2 IMPLANT
COVER MAYO STAND STRL (DRAPES) ×2 IMPLANT
DRAPE U-SHAPE 76X120 STRL (DRAPES) ×2 IMPLANT
GLOVE BIO SURGEON STRL SZ 6.5 (GLOVE) ×1 IMPLANT
GLOVE BIO SURGEON STRL SZ8 (GLOVE) ×2 IMPLANT
GLOVE BIOGEL PI IND STRL 6.5 (GLOVE) IMPLANT
GOWN STRL REUS W/ TWL LRG LVL3 (GOWN DISPOSABLE) ×2 IMPLANT
GOWN STRL REUS W/ TWL XL LVL3 (GOWN DISPOSABLE) ×2 IMPLANT
IV NS 500ML BAXH (IV SOLUTION) ×2 IMPLANT
NDL HYPO 22X1.5 SAFETY MO (MISCELLANEOUS) ×1 IMPLANT
NEEDLE HYPO 22X1.5 SAFETY MO (MISCELLANEOUS) ×2 IMPLANT
NS IRRIG 1000ML POUR BTL (IV SOLUTION) ×1 IMPLANT
PACK BASIN DAY SURGERY FS (CUSTOM PROCEDURE TRAY) ×2 IMPLANT
SLEEVE IRRIGATION ELITE 7 (MISCELLANEOUS) ×2 IMPLANT
SLEEVE SCD COMPRESS KNEE MED (STOCKING) ×1 IMPLANT
SPIKE FLUID TRANSFER (MISCELLANEOUS) IMPLANT
SPONGE SURGIFOAM ABS GEL 12-7 (HEMOSTASIS) IMPLANT
SPONGE T-LAP 4X18 ~~LOC~~+RFID (SPONGE) ×2 IMPLANT
SYR BULB EAR ULCER 3OZ GRN STR (SYRINGE) ×2 IMPLANT
SYR CONTROL 10ML LL (SYRINGE) ×2 IMPLANT
TOOTHBRUSH ADULT (PERSONAL CARE ITEMS) IMPLANT
TOWEL GREEN STERILE FF (TOWEL DISPOSABLE) ×2 IMPLANT
TRAY DSU PREP LF (CUSTOM PROCEDURE TRAY) IMPLANT
TUBE CONNECTING 20X1/4 (TUBING) ×2 IMPLANT
TUBING IRRIGATION (MISCELLANEOUS) ×2 IMPLANT
WATER STERILE IRR 1000ML POUR (IV SOLUTION) ×1 IMPLANT
YANKAUER SUCT BULB TIP NO VENT (SUCTIONS) ×2 IMPLANT

## 2024-04-06 NOTE — Anesthesia Preprocedure Evaluation (Signed)
 Anesthesia Evaluation  Patient identified by MRN, date of birth, ID band Patient awake    Reviewed: Allergy & Precautions, NPO status , Patient's Chart, lab work & pertinent test results  Airway Mallampati: II  TM Distance: >3 FB Neck ROM: Full    Dental   Pulmonary asthma , Current Smoker   breath sounds clear to auscultation       Cardiovascular negative cardio ROS  Rhythm:Regular Rate:Normal     Neuro/Psych  Headaches    GI/Hepatic negative GI ROS, Neg liver ROS,,,  Endo/Other  negative endocrine ROS    Renal/GU negative Renal ROS     Musculoskeletal   Abdominal   Peds  Hematology negative hematology ROS (+)   Anesthesia Other Findings   Reproductive/Obstetrics                              Anesthesia Physical Anesthesia Plan  ASA: 2  Anesthesia Plan: General   Post-op Pain Management:    Induction: Intravenous  PONV Risk Score and Plan: 2 and Dexamethasone , Ondansetron , Midazolam  and Treatment may vary due to age or medical condition  Airway Management Planned: Nasal ETT  Additional Equipment:   Intra-op Plan:   Post-operative Plan: Extubation in OR  Informed Consent: I have reviewed the patients History and Physical, chart, labs and discussed the procedure including the risks, benefits and alternatives for the proposed anesthesia with the patient or authorized representative who has indicated his/her understanding and acceptance.     Dental advisory given  Plan Discussed with: CRNA  Anesthesia Plan Comments:         Anesthesia Quick Evaluation

## 2024-04-06 NOTE — Anesthesia Procedure Notes (Signed)
 Procedure Name: Intubation Date/Time: 04/06/2024 10:04 AM  Performed by: Burnard Rosaline HERO, CRNAPre-anesthesia Checklist: Patient identified, Emergency Drugs available, Suction available and Patient being monitored Patient Re-evaluated:Patient Re-evaluated prior to induction Oxygen Delivery Method: Circle system utilized Preoxygenation: Pre-oxygenation with 100% oxygen Induction Type: IV induction Ventilation: Mask ventilation without difficulty Laryngoscope Size: Mac and 4 Grade View: Grade I Nasal Tubes: Nasal prep performed and Nasal Rae Endobronchial tube: Right Tube size: 7.0 mm Number of attempts: 1 Placement Confirmation: ETT inserted through vocal cords under direct vision, positive ETCO2, breath sounds checked- equal and bilateral and CO2 detector Secured at: 25 cm Tube secured with: Tape Dental Injury: Teeth and Oropharynx as per pre-operative assessment

## 2024-04-06 NOTE — H&P (Signed)
 H&P documentation  -History and Physical Reviewed  -Patient has been re-examined  -No change in the plan of care  [name]

## 2024-04-06 NOTE — Transfer of Care (Signed)
 Immediate Anesthesia Transfer of Care Note  Patient: [name]  Procedure(s) Performed: DENTAL EXTRACTION OF TEETH THREE, SIX, ELEVEN, TWELVE, THIRTEEN, FOURTEEN, FIFTEEN, TWENTY-ONE, TWENTY-TWO, TWENTY-FOUR, TWENTY-FIVE, TWENTY-SIX, TWENTY-SEVEN, AND TWENTY-EIGHT WITH ALVEOLOPLASTY (Mouth)  Patient Location: PACU  Anesthesia Type:General  Level of Consciousness: awake, alert , and patient cooperative  Airway & Oxygen Therapy: Patient Spontanous Breathing and Patient connected to face mask oxygen  Post-op Assessment: Report given to RN and Post -op Vital signs reviewed and stable  Post vital signs: Reviewed and stable  Last Vitals:  Vitals Value Taken Time  BP    Temp    Pulse 97 04/06/24 10:42  Resp    SpO2 100 % 04/06/24 10:42  Vitals shown include unfiled device data.  Last Pain:  Vitals:   04/06/24 0929  TempSrc: Temporal  PainSc: 4          Complications: No notable events documented.

## 2024-04-06 NOTE — Op Note (Signed)
 04/06/2024  10:36 AM  PATIENT:  [name]  38 y.o. female  PRE-OPERATIVE DIAGNOSIS:  NON-RESTORABLE  TEETH #'S THREE, SIX, ELEVEN, TWELVE, THIRTEEN, FOURTEEN, FIFTEEN, TWENTY-ONE, TWENTY-TWO, TWENTY-FOUR, TWENTY-FIVE, TWENTY-SIX, AND TWENTY-SEVEN, SECONDARY TO DENTAL CARIES  POST-OPERATIVE DIAGNOSIS:  SAME  PROCEDURE:  Procedure(s): DENTAL EXTRACTION OF TEETH THREE, SIX, ELEVEN, TWELVE, THIRTEEN, FOURTEEN, FIFTEEN, TWENTY-ONE, TWENTY-TWO, TWENTY-FOUR, TWENTY-FIVE, TWENTY-SIX, TWENTY-SEVEN, AND TWENTY-EIGHT WITH ALVEOLOPLASTY  SURGEON:  Surgeon(s): Sheryle Hamilton, DMD  ANESTHESIA:   local and general  EBL:  minimal  DRAINS: none   SPECIMEN:  No Specimen  COUNTS:  YES  PLAN OF CARE: Discharge to home after PACU  PATIENT DISPOSITION:  PACU - hemodynamically stable.   PROCEDURE DETAILS: Dictation #[id number]  Hamilton EMERSON Sheryle, DMD 04/06/2024 10:36 AM

## 2024-04-06 NOTE — Discharge Instructions (Signed)

## 2024-04-06 NOTE — Progress Notes (Signed)
 Last time received oxycodone  written on dc paperwork.

## 2024-04-06 NOTE — Progress Notes (Signed)
 Last time received tylenol  written on dc paperwork.

## 2024-04-06 NOTE — Op Note (Signed)
 [name], [name] MEDICAL RECORD NO: [id number] ACCOUNT NO: [id number] DATE OF BIRTH: Jun 22, 1986 FACILITY: [facility] LOCATION: [facility] PHYSICIAN: Glendia EMERSON Primrose, DDS  Operative Report   DATE OF PROCEDURE: 04/06/2024  PREOPERATIVE DIAGNOSES:  Nonrestorable teeth numbers 3, 6, 11, 12, 13, 14, 15, 21, 22, 24, 25, 26, and 27 secondary to dental caries.  POSTOPERATIVE DIAGNOSES:  Nonrestorable teeth numbers 3, 6, 11, 12, 13, 14, 15, 21, 22, 24, 25, 26, and 27 secondary to dental caries.  PROCEDURE:  Extraction of teeth numbers per above.  Alveoloplasty right and left maxilla and mandible.  SURGEON:  Glendia EMERSON Primrose, DDS  ANESTHESIA: General.  Nasal intubation.  Dr. Epifanio, attending physician.  DESCRIPTION OF PROCEDURE:  The patient was taken to the operating room and placed on the table in supine position.  General anesthesia was administered.  A nasal endotracheal tube was placed and secured.  The eyes were protected.  The patient was draped  for surgery.  Timeout was performed.  The posterior pharynx was suctioned, and a throat pack was placed.  2% lidocaine  with 1:100,000 epinephrine  was infiltrated in an inferior alveolar block in the right and left mandible and in the anterior buccal  vestibule of the mandible.  Additional local anesthesia was administered around the maxillary teeth to be removed in the buccal and palatal areas.  A bite block was placed in the right side of the mouth.  The left side was operated.  A 15 blade was used  to make an incision beginning at tooth #20 area and carried forward to tooth number 21, 22, 24, 25, and 26.  The periosteum was reflected.  The teeth were elevated with a 301 elevator and removed from the mouth with dental forceps.  The tissue was  trimmed to allow for primary closure and then reflected to expose the alveolar crest, which had irregular contour.  Alveoloplasty was performed using an egg burr followed by the bone file, and the area was  irrigated and closed with 3-0 chromic.  Then, a  15 blade was used to make incision around teeth numbers 11, 12, 13, 14, and 15 both buccally and palatally in the gingival sulcus.  The periosteum was reflected.  The teeth were elevated with a 301 elevator and removed from the mouth with upper universal  forceps.  The sockets was curetted.  Then, the periosteum was reflected, and the alveoloplasty was performed using an egg burr followed by the bone file.  Then, this area was irrigated and closed with 3-0 chromic.  Attention was turned to the right  mandible.  A 15 blade was used to make an incision around teeth numbers 26, 27, and 28.  The periosteum was reflected.  The teeth were elevated and removed with the dental forceps.  The tissue was reflected to expose the alveolar crest, which was  irregular and had a prominence at the area of tooth #28 buccal.  The egg burr and the Stryker handpiece under irrigation was used to perform an alveoloplasty.  Then, the bone file was used to further smooth the area.  Then, this area was irrigated and  closed with 3-0 chromic.  Teeth numbers 3 and 6 were removed using the #15 blade to make a sulcular incision, the periosteal elevator to elevate the teeth, and the dental forceps to remove the teeth.  No suture was necessary.  The sockets were curetted  and irrigated.  Then, the oral cavity was irrigated and suctioned.  The throat pack was  removed.  The patient was left under care of anesthesia for extubation and transport to recovery with plans for discharge home through day surgery.  ESTIMATED BLOOD LOSS:  Minimum.  COMPLICATIONS:  None.  SPECIMENS:  None.  COUNTS:  Correct.   PUS D: 04/06/2024 10:40:52 am T: 04/06/2024 4:02:00 pm  JOB: [id number]/ [id number]

## 2024-04-09 ENCOUNTER — Encounter (HOSPITAL_BASED_OUTPATIENT_CLINIC_OR_DEPARTMENT_OTHER): Payer: Self-pay | Admitting: Oral Surgery

## 2024-04-09 NOTE — Anesthesia Postprocedure Evaluation (Signed)
 Anesthesia Post Note  Patient: [place]  Procedure(s) Performed: DENTAL EXTRACTION OF TEETH THREE, SIX, ELEVEN, TWELVE, THIRTEEN, FOURTEEN, FIFTEEN, TWENTY-ONE, TWENTY-TWO, TWENTY-FOUR, TWENTY-FIVE, TWENTY-SIX, TWENTY-SEVEN, AND TWENTY-EIGHT WITH ALVEOLOPLASTY (Mouth)     Patient location during evaluation: PACU Anesthesia Type: General Level of consciousness: awake and alert Pain management: pain level controlled Vital Signs Assessment: post-procedure vital signs reviewed and stable Respiratory status: spontaneous breathing, nonlabored ventilation, respiratory function stable and patient connected to nasal cannula oxygen Cardiovascular status: blood pressure returned to baseline and stable Postop Assessment: no apparent nausea or vomiting Anesthetic complications: no   No notable events documented.  Last Vitals:  Vitals:   04/06/24 1128 04/06/24 1144  BP: (!) 126/53 116/62  Pulse: 77 74  Resp: 14 18  Temp:  37 C  SpO2: 96% 97%    Last Pain:  Vitals:   04/06/24 1155  TempSrc:   PainSc: 5                  Epifanio Lamar BRAVO
